# Patient Record
Sex: Female | Born: 1960 | Race: White | Hispanic: No | Marital: Married | Smoking: Never smoker
Health system: Southern US, Community
[De-identification: ages and names within clinical notes are randomized; demographics above are authoritative.]

## PROBLEM LIST (undated history)

## (undated) DIAGNOSIS — G43909 Migraine, unspecified, not intractable, without status migrainosus: Secondary | ICD-10-CM

## (undated) DIAGNOSIS — K760 Fatty (change of) liver, not elsewhere classified: Secondary | ICD-10-CM

## (undated) DIAGNOSIS — E559 Vitamin D deficiency, unspecified: Secondary | ICD-10-CM

## (undated) DIAGNOSIS — N390 Urinary tract infection, site not specified: Secondary | ICD-10-CM

## (undated) DIAGNOSIS — Z8619 Personal history of other infectious and parasitic diseases: Secondary | ICD-10-CM

## (undated) HISTORY — DX: Migraine, unspecified, not intractable, without status migrainosus: G43.909

## (undated) HISTORY — DX: Personal history of other infectious and parasitic diseases: Z86.19

## (undated) HISTORY — DX: Urinary tract infection, site not specified: N39.0

## (undated) HISTORY — PX: DEBRIDEMENT TENNIS ELBOW: SHX1442

---

## 1998-04-28 ENCOUNTER — Other Ambulatory Visit: Admission: RE | Admit: 1998-04-28 | Discharge: 1998-04-28 | Payer: Self-pay | Admitting: Obstetrics and Gynecology

## 1999-05-07 ENCOUNTER — Other Ambulatory Visit: Admission: RE | Admit: 1999-05-07 | Discharge: 1999-05-07 | Payer: Self-pay | Admitting: Obstetrics and Gynecology

## 2000-05-26 ENCOUNTER — Other Ambulatory Visit: Admission: RE | Admit: 2000-05-26 | Discharge: 2000-05-26 | Payer: Self-pay | Admitting: Obstetrics and Gynecology

## 2001-06-16 ENCOUNTER — Other Ambulatory Visit: Admission: RE | Admit: 2001-06-16 | Discharge: 2001-06-16 | Payer: Self-pay | Admitting: Obstetrics and Gynecology

## 2002-08-17 ENCOUNTER — Other Ambulatory Visit: Admission: RE | Admit: 2002-08-17 | Discharge: 2002-08-17 | Payer: Self-pay | Admitting: Obstetrics and Gynecology

## 2003-09-15 ENCOUNTER — Other Ambulatory Visit: Admission: RE | Admit: 2003-09-15 | Discharge: 2003-09-15 | Payer: Self-pay | Admitting: Obstetrics and Gynecology

## 2004-10-31 ENCOUNTER — Other Ambulatory Visit: Admission: RE | Admit: 2004-10-31 | Discharge: 2004-10-31 | Payer: Self-pay | Admitting: Obstetrics and Gynecology

## 2006-11-28 ENCOUNTER — Ambulatory Visit: Payer: Self-pay | Admitting: Orthopaedic Surgery

## 2006-12-11 HISTORY — PX: DEBRIDEMENT TENNIS ELBOW: SHX1442

## 2009-04-15 LAB — HM COLONOSCOPY: HM Colonoscopy: NORMAL

## 2010-08-12 HISTORY — PX: COLONOSCOPY: SHX174

## 2012-03-22 LAB — HM MAMMOGRAPHY: HM Mammogram: NORMAL

## 2012-03-22 LAB — HM COLONOSCOPY: HM Colonoscopy: NORMAL

## 2012-03-22 LAB — HM PAP SMEAR: HM Pap smear: NORMAL

## 2012-09-22 ENCOUNTER — Ambulatory Visit (INDEPENDENT_AMBULATORY_CARE_PROVIDER_SITE_OTHER): Payer: BC Managed Care – PPO | Admitting: Internal Medicine

## 2012-09-22 ENCOUNTER — Encounter: Payer: Self-pay | Admitting: Internal Medicine

## 2012-09-22 VITALS — BP 116/72 | HR 75 | Temp 97.6°F | Resp 16 | Ht 65.0 in | Wt 149.0 lb

## 2012-09-22 DIAGNOSIS — H6120 Impacted cerumen, unspecified ear: Secondary | ICD-10-CM

## 2012-09-22 DIAGNOSIS — R002 Palpitations: Secondary | ICD-10-CM

## 2012-09-22 DIAGNOSIS — R42 Dizziness and giddiness: Secondary | ICD-10-CM

## 2012-09-22 LAB — COMPREHENSIVE METABOLIC PANEL
ALT: 19 U/L (ref 0–35)
AST: 24 U/L (ref 0–37)
Albumin: 4.5 g/dL (ref 3.5–5.2)
Alkaline Phosphatase: 86 U/L (ref 39–117)
BUN: 13 mg/dL (ref 6–23)
CO2: 28 mEq/L (ref 19–32)
Calcium: 9.4 mg/dL (ref 8.4–10.5)
Chloride: 103 mEq/L (ref 96–112)
Creatinine, Ser: 0.8 mg/dL (ref 0.4–1.2)
GFR: 83.43 mL/min (ref >60.00)
Glucose, Bld: 104 mg/dL — ABNORMAL HIGH (ref 70–99)
Potassium: 4.3 mEq/L (ref 3.5–5.1)
Sodium: 139 mEq/L (ref 135–145)
Total Bilirubin: 0.7 mg/dL (ref 0.3–1.2)
Total Protein: 7.2 g/dL (ref 6.0–8.3)

## 2012-09-22 LAB — MAGNESIUM: Magnesium: 1.9 mg/dL (ref 1.5–2.5)

## 2012-09-22 LAB — TSH: TSH: 1.11 u[IU]/mL (ref 0.35–5.50)

## 2012-09-22 LAB — VITAMIN B12: Vitamin B-12: 611 pg/mL (ref 211–911)

## 2012-09-22 MED ORDER — FLUTICASONE PROPIONATE 50 MCG/ACT NA SUSP: 2.0000 | Freq: Every day | NASAL | Status: DC

## 2012-09-22 NOTE — Patient Instructions (Addendum)
Please buy a bottle of Debrox ear drops and put a few drops in your left ear tonight and tomorrow  Return on Thursday afternoon (if convenient) for your ear cleaning  Use the nasal steroid twice daily for the next two weeks   Meclizine for acute attacks of vertigo  Return for fasting lipids/labs prior to your physical in August   Nonfasting labs today to check thyroid

## 2012-09-22 NOTE — Progress Notes (Addendum)
Patient ID: Kristine Mcguire, female   DOB: 05/21/1961, 52 y.o.   MRN: [id number]  Patient Active Problem List  Diagnosis  . Palpitations  . Dizziness and giddiness    Subjective:  CC:   Chief Complaint  Patient presents with  . Establish Care    HPI:   Kristine Mcguire is a healthy 52 y.o. female who presents as a new patient to establish primary care with the chief complaint of  1) Irregular pulse.   Mom died in Jul 23, 2023 and the morning of her funeral she was awakened by an irregular forceful pulse unaccompendied by ligfht headedeness or shortness of breat.  No chest pain.  The feeling lasted 30 minutes.  Has not occurred again.  2) One month HISTORY OF RECURRENT episodes of dizziness which initially occurred while packing up her kitchen prior to  recent renovation.  It has been occurring frequently for  the past week.( "like I'm a  little drunk)  Aggravated by motion.  Tried dramamine which helped. No nausea or light headedness,  Thought it improved with sudafed and advil initially but for the past week the sudafed has not helped.  Continues to feel increased pressure in the ears.,  No symptoms of sinus infection, no palpitations.     Past Medical History  Diagnosis Date  . History of chicken pox   . UTI (urinary tract infection)   . Migraine     Past Surgical History  Procedure Laterality Date  . Cesarean section  1992  . Debridement tennis elbow  208    Right elbow    Family History  Problem Relation Age of Onset  . Hyperlipidemia Mother   . Hypertension Mother 60    Runs in mother's side of family  . Cancer Father     bone  . Cancer Maternal Aunt     breast  . Heart attack Maternal Uncle 46  . Heart attack Maternal Grandmother 76  . Heart attack Maternal Grandfather 68  . Alzheimer's disease Paternal Grandmother     History   Social History  . Marital Status: Single    Spouse Name: N/A    Number of Children: N/A  . Years of Education: N/A    Occupational History  . Not on file.   Social History Main Topics  . Smoking status: Never Smoker   . Smokeless tobacco: Never Used  . Alcohol Use: Yes  . Drug Use: No  . Sexually Active: Not on file   Other Topics Concern  . Not on file   Social History Narrative  . No narrative on file   No Known Allergies   Review of Systems:   Patient denies headache, fevers, malaise, unintentional weight loss, skin rash, eye pain, sinus congestion and sinus pain, sore throat, dysphagia,  hemoptysis , cough, dyspnea, wheezing, chest pain, palpitations, orthopnea, edema, abdominal pain, nausea, melena, diarrhea, constipation, flank pain, dysuria, hematuria, urinary  Frequency, nocturia, numbness, tingling, seizures,  Focal weakness, Loss of consciousness,  Tremor, insomnia, depression, anxiety, and suicidal ideation.    Objective:  BP 116/72  Pulse 75  Temp(Src) 97.6 F (36.4 C) (Oral)  Resp 16  Ht 5\' 5"  (1.651 m)  Wt 149 lb (67.586 kg)  BMI 24.79 kg/m2  SpO2 97%  General appearance: alert, cooperative and appears stated age Ears: left TM not visibel secondary to cerumen impaction. Right TM and canal normal  Oropharynx: mucosa, and tongue normal; teeth and gums normal Neck: no adenopathy, no carotid bruit,  supple, symmetrical, trachea midline and thyroid not enlarged, symmetric, no tenderness/mass/nodules Back: symmetric, no curvature. ROM normal. No CVA tenderness. Lungs: clear to auscultation bilaterally Heart: regular rate and rhythm, S1, S2 normal, no murmur, click, rub or gallop Abdomen: soft, non-tender; bowel sounds normal; no masses,  no organomegaly Pulses: 2+ and symmetric Skin: Skin color, texture, turgor normal. No rashes or lesions Lymph nodes: Cervical, supraclavicular, and axillary nodes normal.  Assessment and Plan:  Palpitations Likely stressed induced, as her ekg shows only sinus bradycardia and all labs are normal  Dizziness and giddiness Etiology appears  to be cerumen impactionoif left ear based on exam,nomral  EKG and review of labs. She will return for disimpaction  Cerumen impaction Debrox, return for irrigation.    Updated Medication List Outpatient Encounter Prescriptions as of 09/22/2012  Medication Sig Dispense Refill  . fluticasone (FLONASE) 50 MCG/ACT nasal spray Place 2 sprays into the nose daily.  16 g  6   No facility-administered encounter medications on file as of 09/22/2012.

## 2012-09-23 DIAGNOSIS — H6120 Impacted cerumen, unspecified ear: Secondary | ICD-10-CM | POA: Insufficient documentation

## 2012-09-23 DIAGNOSIS — R002 Palpitations: Secondary | ICD-10-CM | POA: Insufficient documentation

## 2012-09-23 DIAGNOSIS — R42 Dizziness and giddiness: Secondary | ICD-10-CM | POA: Insufficient documentation

## 2012-09-23 LAB — FOLATE RBC: RBC Folate: 922 ng/mL (ref >=366)

## 2012-09-23 NOTE — Assessment & Plan Note (Signed)
Likely stressed induced, as her ekg shows only sinus bradycardia and all labs are normal

## 2012-09-23 NOTE — Assessment & Plan Note (Signed)
Debrox, return for irrigation.

## 2012-09-23 NOTE — Addendum Note (Signed)
Addended by: Sherlene Shams on: 09/23/2012 05:43 PM   Modules accepted: Level of Service

## 2012-09-23 NOTE — Assessment & Plan Note (Addendum)
Etiology appears to be cerumen impactionoif left ear based on exam,nomral  EKG and review of labs. She will return for disimpaction

## 2012-09-24 ENCOUNTER — Ambulatory Visit: Payer: BC Managed Care – PPO

## 2012-09-29 ENCOUNTER — Ambulatory Visit (INDEPENDENT_AMBULATORY_CARE_PROVIDER_SITE_OTHER): Payer: BC Managed Care – PPO | Admitting: Internal Medicine

## 2012-09-29 DIAGNOSIS — H6120 Impacted cerumen, unspecified ear: Secondary | ICD-10-CM

## 2012-10-01 NOTE — Progress Notes (Signed)
Patient ID: Kristine Mcguire, female   DOB: July 26, 1961, 52 y.o.   MRN: [id number]   Patient Active Problem List  Diagnosis  . Palpitations  . Dizziness and giddiness  . Cerumen impaction    Subjective:  CC:   No chief complaint on file.   HPI:   Kristine Mcguire is a 52 y.o. female who presents for management of cerumen impaction.

## 2012-10-05 ENCOUNTER — Telehealth: Payer: Self-pay | Admitting: Emergency Medicine

## 2012-10-05 DIAGNOSIS — R42 Dizziness and giddiness: Secondary | ICD-10-CM

## 2012-10-05 NOTE — Telephone Encounter (Signed)
Ordered in epic

## 2012-10-05 NOTE — Telephone Encounter (Signed)
Patient was calling and questioning if we had made her an ENT apt. I did not see a referral for this. Please advise. I will be glad to make her an apt if this is ok. She states the dizziness is a lot stronger over the weekend.

## 2012-10-13 ENCOUNTER — Other Ambulatory Visit: Payer: BC Managed Care – PPO

## 2012-11-06 ENCOUNTER — Telehealth: Payer: Self-pay | Admitting: *Deleted

## 2012-11-06 DIAGNOSIS — R42 Dizziness and giddiness: Secondary | ICD-10-CM

## 2012-11-06 DIAGNOSIS — Z13220 Encounter for screening for lipoid disorders: Secondary | ICD-10-CM

## 2012-11-06 NOTE — Telephone Encounter (Signed)
Pt is coming in for labs Monday 03.31.2014 what labs and dx would you like?

## 2012-11-09 ENCOUNTER — Other Ambulatory Visit (INDEPENDENT_AMBULATORY_CARE_PROVIDER_SITE_OTHER): Payer: BC Managed Care – PPO

## 2012-11-09 DIAGNOSIS — R42 Dizziness and giddiness: Secondary | ICD-10-CM

## 2012-11-09 DIAGNOSIS — Z13220 Encounter for screening for lipoid disorders: Secondary | ICD-10-CM

## 2012-11-09 LAB — LIPID PANEL
Cholesterol: 238 mg/dL — ABNORMAL HIGH (ref 0–200)
HDL: 44.6 mg/dL (ref >39.00)
Total CHOL/HDL Ratio: 5
Triglycerides: 145 mg/dL (ref 0.0–149.0)
VLDL: 29 mg/dL (ref 0.0–40.0)

## 2012-11-09 LAB — CBC WITH DIFFERENTIAL/PLATELET
Basophils Absolute: 0 10*3/uL (ref 0.0–0.1)
Basophils Relative: 0.6 % (ref 0.0–3.0)
Eosinophils Absolute: 0.1 10*3/uL (ref 0.0–0.7)
Eosinophils Relative: 2 % (ref 0.0–5.0)
HCT: 40 % (ref 36.0–46.0)
Hemoglobin: 13.6 g/dL (ref 12.0–15.0)
Lymphocytes Relative: 38.4 % (ref 12.0–46.0)
Lymphs Abs: 1.8 10*3/uL (ref 0.7–4.0)
MCHC: 34.1 g/dL (ref 30.0–36.0)
MCV: 89 fl (ref 78.0–100.0)
Monocytes Absolute: 0.4 10*3/uL (ref 0.1–1.0)
Monocytes Relative: 7.9 % (ref 3.0–12.0)
Neutro Abs: 2.4 10*3/uL (ref 1.4–7.7)
Neutrophils Relative %: 51.1 % (ref 43.0–77.0)
Platelets: 289 10*3/uL (ref 150.0–400.0)
RBC: 4.49 Mil/uL (ref 3.87–5.11)
RDW: 13 % (ref 11.5–14.6)
WBC: 4.7 10*3/uL (ref 4.5–10.5)

## 2012-11-09 LAB — LDL CHOLESTEROL, DIRECT: Direct LDL: 170.6 mg/dL

## 2012-11-10 NOTE — Progress Notes (Signed)
Pt notified of the results.  

## 2013-02-17 ENCOUNTER — Encounter: Payer: Self-pay | Admitting: Internal Medicine

## 2013-04-09 ENCOUNTER — Encounter: Payer: BC Managed Care – PPO | Admitting: Internal Medicine

## 2013-04-15 ENCOUNTER — Ambulatory Visit (INDEPENDENT_AMBULATORY_CARE_PROVIDER_SITE_OTHER): Payer: BC Managed Care – PPO | Admitting: Internal Medicine

## 2013-04-15 ENCOUNTER — Encounter: Payer: Self-pay | Admitting: Internal Medicine

## 2013-04-15 VITALS — BP 112/68 | HR 67 | Temp 97.6°F | Resp 14 | Ht 65.75 in | Wt 149.0 lb

## 2013-04-15 DIAGNOSIS — Z23 Encounter for immunization: Secondary | ICD-10-CM

## 2013-04-15 DIAGNOSIS — Z0000 Encounter for general adult medical examination without abnormal findings: Secondary | ICD-10-CM

## 2013-04-15 DIAGNOSIS — Z1211 Encounter for screening for malignant neoplasm of colon: Secondary | ICD-10-CM | POA: Insufficient documentation

## 2013-04-15 DIAGNOSIS — Z1239 Encounter for other screening for malignant neoplasm of breast: Secondary | ICD-10-CM

## 2013-04-15 DIAGNOSIS — E785 Hyperlipidemia, unspecified: Secondary | ICD-10-CM

## 2013-04-15 DIAGNOSIS — R002 Palpitations: Secondary | ICD-10-CM

## 2013-04-15 DIAGNOSIS — N952 Postmenopausal atrophic vaginitis: Secondary | ICD-10-CM

## 2013-04-15 LAB — LIPID PANEL
Cholesterol: 251 mg/dL — ABNORMAL HIGH (ref 0–200)
HDL: 52.9 mg/dL (ref >39.00)
Total CHOL/HDL Ratio: 5
Triglycerides: 136 mg/dL (ref 0.0–149.0)
VLDL: 27.2 mg/dL (ref 0.0–40.0)

## 2013-04-15 LAB — CBC WITH DIFFERENTIAL/PLATELET
Basophils Absolute: 0 10*3/uL (ref 0.0–0.1)
Basophils Relative: 0.7 % (ref 0.0–3.0)
Eosinophils Absolute: 0.1 10*3/uL (ref 0.0–0.7)
Eosinophils Relative: 1.5 % (ref 0.0–5.0)
HCT: 40.3 % (ref 36.0–46.0)
Hemoglobin: 13.6 g/dL (ref 12.0–15.0)
Lymphocytes Relative: 32.5 % (ref 12.0–46.0)
Lymphs Abs: 1.7 10*3/uL (ref 0.7–4.0)
MCHC: 33.7 g/dL (ref 30.0–36.0)
MCV: 90.7 fl (ref 78.0–100.0)
Monocytes Absolute: 0.5 10*3/uL (ref 0.1–1.0)
Monocytes Relative: 8.7 % (ref 3.0–12.0)
Neutro Abs: 3 10*3/uL (ref 1.4–7.7)
Neutrophils Relative %: 56.6 % (ref 43.0–77.0)
Platelets: 304 10*3/uL (ref 150.0–400.0)
RBC: 4.44 Mil/uL (ref 3.87–5.11)
RDW: 12.7 % (ref 11.5–14.6)
WBC: 5.4 10*3/uL (ref 4.5–10.5)

## 2013-04-15 LAB — COMPREHENSIVE METABOLIC PANEL
ALT: 19 U/L (ref 0–35)
AST: 20 U/L (ref 0–37)
Albumin: 4.4 g/dL (ref 3.5–5.2)
Alkaline Phosphatase: 87 U/L (ref 39–117)
BUN: 15 mg/dL (ref 6–23)
CO2: 29 mEq/L (ref 19–32)
Calcium: 9.5 mg/dL (ref 8.4–10.5)
Chloride: 105 mEq/L (ref 96–112)
Creatinine, Ser: 0.8 mg/dL (ref 0.4–1.2)
GFR: 83.25 mL/min (ref >60.00)
Glucose, Bld: 101 mg/dL — ABNORMAL HIGH (ref 70–99)
Potassium: 4.2 mEq/L (ref 3.5–5.1)
Sodium: 138 mEq/L (ref 135–145)
Total Bilirubin: 0.7 mg/dL (ref 0.3–1.2)
Total Protein: 7.1 g/dL (ref 6.0–8.3)

## 2013-04-15 LAB — LDL CHOLESTEROL, DIRECT: Direct LDL: 174.1 mg/dL

## 2013-04-15 LAB — TSH: TSH: 1.5 u[IU]/mL (ref 0.35–5.50)

## 2013-04-15 NOTE — Progress Notes (Signed)
Patient ID: Kristine Mcguire, female   DOB: 10/19/1960, 52 y.o.   MRN: [id number]    Subjective:     Kristine Mcguire is a 52 y.o. female and is here for a comprehensive physical exam. The patient reports .Marland Kitchen  History   Social History  . Marital Status: Single    Spouse Name: N/A    Number of Children: N/A  . Years of Education: N/A   Occupational History  . Not on file.   Social History Main Topics  . Smoking status: Never Smoker   . Smokeless tobacco: Never Used  . Alcohol Use: Yes  . Drug Use: No  . Sexual Activity: Not on file   Other Topics Concern  . Not on file   Social History Narrative  . No narrative on file   Health Maintenance  Topic Date Due  . Influenza Vaccine  03/12/2014  . Mammogram  03/22/2014  . Pap Smear  03/23/2015  . Colonoscopy  03/22/2022  . Tetanus/tdap  04/16/2023    The following portions of the patient's history were reviewed and updated as appropriate: allergies, current medications, past family history, past medical history, past social history, past surgical history and problem list.  Review of Systems A comprehensive review of systems was negative.   Objective:    General appearance: alert, cooperative and appears stated age Head: Normocephalic, without obvious abnormality, atraumatic Eyes: conjunctivae/corneas clear. PERRL, EOM's intact. Fundi benign. Ears: normal TM's and external ear canals both ears Nose: Nares normal. Septum midline. Mucosa normal. No drainage or sinus tenderness. Throat: lips, mucosa, and tongue normal; teeth and gums normal Neck: no adenopathy, no carotid bruit, no JVD, supple, symmetrical, trachea midline and thyroid not enlarged, symmetric, no tenderness/mass/nodules Lungs: clear to auscultation bilaterally Breasts: normal appearance, no masses or tenderness Heart: regular rate and rhythm, S1, S2 normal, no murmur, click, rub or gallop Abdomen: soft, non-tender; bowel sounds normal; no masses,  no  organomegaly Extremities: extremities normal, atraumatic, no cyanosis or edema Pulses: 2+ and symmetric Skin: Skin color, texture, turgor normal. No rashes or lesions Neurologic: Alert and oriented X 3, normal strength and tone. Normal symmetric reflexes. Normal coordination and gait.  Assessment:   Palpitations Prior  Etiology appeared to be anxiety.  EKG normal.,  checking thyroid lytes.  Reduce caffeine in diet.   Routine general medical examination at a health care facility Annual comprehensive exam was done including breast, without pelvic and PAP smear. All screenings have been addressed .    Updated Medication List Outpatient Encounter Prescriptions as of 04/15/2013  Medication Sig Dispense Refill  . PREMARIN vaginal cream Place 1 Applicatorful vaginally as needed.      . fluticasone (FLONASE) 50 MCG/ACT nasal spray Place 2 sprays into the nose daily.  16 g  6   No facility-administered encounter medications on file as of 04/15/2013.

## 2013-04-15 NOTE — Patient Instructions (Addendum)
Mammogram to be set up ASAP at [facility]   PAP smear not due until 2016  Fasting labs today   You recieved the TDaP and the influenza vaccine today

## 2013-04-17 ENCOUNTER — Encounter: Payer: Self-pay | Admitting: Internal Medicine

## 2013-04-17 NOTE — Assessment & Plan Note (Signed)
Prior  Etiology appeared to be anxiety.  EKG normal.,  checking thyroid lytes.  Reduce caffeine in diet.

## 2013-04-17 NOTE — Assessment & Plan Note (Signed)
Annual comprehensive exam was done including breast,without  pelvic and PAP smear. All screenings have been addressed .  

## 2013-04-22 ENCOUNTER — Encounter: Payer: Self-pay | Admitting: *Deleted

## 2013-04-30 ENCOUNTER — Ambulatory Visit: Payer: Self-pay | Admitting: Internal Medicine

## 2013-04-30 LAB — HM MAMMOGRAPHY: HM Mammogram: NORMAL

## 2013-05-11 ENCOUNTER — Other Ambulatory Visit: Payer: Self-pay | Admitting: Internal Medicine

## 2013-07-23 ENCOUNTER — Ambulatory Visit (INDEPENDENT_AMBULATORY_CARE_PROVIDER_SITE_OTHER): Payer: BC Managed Care – PPO | Admitting: Internal Medicine

## 2013-07-23 ENCOUNTER — Encounter: Payer: Self-pay | Admitting: Internal Medicine

## 2013-07-23 ENCOUNTER — Telehealth: Payer: Self-pay | Admitting: Internal Medicine

## 2013-07-23 VITALS — BP 118/78 | HR 82 | Wt 145.0 lb

## 2013-07-23 DIAGNOSIS — H109 Unspecified conjunctivitis: Secondary | ICD-10-CM | POA: Insufficient documentation

## 2013-07-23 DIAGNOSIS — J069 Acute upper respiratory infection, unspecified: Secondary | ICD-10-CM

## 2013-07-23 MED ORDER — POLYMYXIN B-TRIMETHOPRIM 10000-0.1 UNIT/ML-% OP SOLN: 1.0000 [drp] | Freq: Four times a day (QID) | OPHTHALMIC | Status: DC

## 2013-07-23 NOTE — Telephone Encounter (Signed)
Left message, advising pt to keep her appt for today, that an eye drop can not be called without an office visit.

## 2013-07-23 NOTE — Assessment & Plan Note (Signed)
Symptoms are consistent with early left conjunctivitis. We will treat with topical Polytrim. Patient will call if symptoms are not improving.

## 2013-07-23 NOTE — Assessment & Plan Note (Signed)
Symptoms and exam are consistent with viral upper respiratory infection with cough. Encouraged rest, adequate fluid intake, and use of over-the-counter cough medications such as Robitussin-DM as needed. Patient will use Tylenol or ibuprofen as needed for fever or pain. She will call if symptoms are persistent and are not improving over the next few days.

## 2013-07-23 NOTE — Progress Notes (Signed)
Pre visit review using our clinic review tool, if applicable. No additional management support is needed unless otherwise documented below in the visit note. 

## 2013-07-23 NOTE — Telephone Encounter (Signed)
States she has had a cold x9 days that has now moved into her left eye.  States she feels better from the cold but her eye is gooey and painful.  Asking if there is an eyedrop that could be called in for her.  Appt scheduled with Dr. Dan Humphreys 2:45, cancel if pt can get eyedrop.

## 2013-07-23 NOTE — Progress Notes (Signed)
   Subjective:    Patient ID: Kristine Mcguire, female    DOB: July 16, 1961, 52 y.o.   MRN: [id number]  HPI 52 year old female presents for acute visit complaining of almost two-week history of nasal congestion and dry cough. Symptoms first began about 2 weeks ago with general malaise, nasal congestion, and dry cough. Over the last 2 weeks, symptoms have gradually improved. She has been using over-the-counter cough and cold medications with some improvement. She denies any fever or chills. She denies shortness of breath or chest pain. She was concerned today because she has developed left eye redness and exudate. She is concerned about bacterial eye infection. She denies any known sick contacts. She denies any trauma to her eye. She does not have any visual changes or photophobia. She denies eye pain.  Outpatient Prescriptions Prior to Visit  Medication Sig Dispense Refill  . PREMARIN vaginal cream Place 1 Applicatorful vaginally as needed.      . fluticasone (FLONASE) 50 MCG/ACT nasal spray Place 2 sprays into the nose daily.  16 g  6   No facility-administered medications prior to visit.   BP 118/78  Pulse 82  Wt 145 lb (65.772 kg)  SpO2 96%  Review of Systems  Constitutional: Negative for fever, chills and unexpected weight change.  HENT: Positive for congestion, postnasal drip and rhinorrhea. Negative for ear discharge, ear pain, facial swelling, hearing loss, mouth sores, nosebleeds, sinus pressure, sneezing, sore throat, tinnitus, trouble swallowing and voice change.   Eyes: Positive for discharge and redness. Negative for pain and visual disturbance.  Respiratory: Positive for cough. Negative for chest tightness, shortness of breath, wheezing and stridor.   Cardiovascular: Negative for chest pain, palpitations and leg swelling.  Musculoskeletal: Negative for arthralgias, myalgias, neck pain and neck stiffness.  Skin: Negative for color change and rash.  Neurological: Negative for  dizziness, weakness, light-headedness and headaches.  Hematological: Negative for adenopathy.       Objective:   Physical Exam  Constitutional: She is oriented to person, place, and time. She appears well-developed and well-nourished. No distress.  HENT:  Head: Normocephalic and atraumatic.  Right Ear: External ear normal.  Left Ear: External ear normal.  Nose: Nose normal.  Mouth/Throat: Oropharynx is clear and moist. No oropharyngeal exudate.  Eyes: Pupils are equal, round, and reactive to light. Right eye exhibits no discharge. Left eye exhibits discharge. Left conjunctiva is injected. No scleral icterus. Left eye exhibits normal extraocular motion.  Neck: Normal range of motion. Neck supple. No tracheal deviation present. No thyromegaly present.  Cardiovascular: Normal rate, regular rhythm, normal heart sounds and intact distal pulses.  Exam reveals no gallop and no friction rub.   No murmur heard. Pulmonary/Chest: Effort normal and breath sounds normal. No accessory muscle usage. Not tachypneic. No respiratory distress. She has no decreased breath sounds. She has no wheezes. She has no rhonchi. She has no rales. She exhibits no tenderness.  Musculoskeletal: Normal range of motion. She exhibits no edema and no tenderness.  Lymphadenopathy:    She has no cervical adenopathy.  Neurological: She is alert and oriented to person, place, and time. No cranial nerve deficit. She exhibits normal muscle tone. Coordination normal.  Skin: Skin is warm and dry. No rash noted. She is not diaphoretic. No erythema. No pallor.  Psychiatric: She has a normal mood and affect. Her behavior is normal. Judgment and thought content normal.          Assessment & Plan:

## 2013-07-30 ENCOUNTER — Other Ambulatory Visit: Payer: Self-pay | Admitting: *Deleted

## 2013-07-30 DIAGNOSIS — N952 Postmenopausal atrophic vaginitis: Secondary | ICD-10-CM

## 2013-07-30 MED ORDER — ESTROGENS, CONJUGATED 0.625 MG/GM VA CREA: 1.0000 | TOPICAL_CREAM | VAGINAL | Status: DC | PRN

## 2013-10-13 ENCOUNTER — Telehealth: Payer: Self-pay | Admitting: *Deleted

## 2013-10-13 DIAGNOSIS — R5381 Other malaise: Secondary | ICD-10-CM

## 2013-10-13 DIAGNOSIS — R5383 Other fatigue: Secondary | ICD-10-CM

## 2013-10-13 DIAGNOSIS — E559 Vitamin D deficiency, unspecified: Secondary | ICD-10-CM

## 2013-10-13 DIAGNOSIS — E785 Hyperlipidemia, unspecified: Secondary | ICD-10-CM

## 2013-10-13 NOTE — Telephone Encounter (Signed)
Pt is coming in tomorrow what labs and dx?  

## 2013-10-14 ENCOUNTER — Other Ambulatory Visit (INDEPENDENT_AMBULATORY_CARE_PROVIDER_SITE_OTHER): Payer: BC Managed Care – PPO

## 2013-10-14 DIAGNOSIS — E785 Hyperlipidemia, unspecified: Secondary | ICD-10-CM

## 2013-10-14 DIAGNOSIS — R5383 Other fatigue: Principal | ICD-10-CM

## 2013-10-14 DIAGNOSIS — E559 Vitamin D deficiency, unspecified: Secondary | ICD-10-CM

## 2013-10-14 DIAGNOSIS — R5381 Other malaise: Secondary | ICD-10-CM

## 2013-10-14 LAB — COMPREHENSIVE METABOLIC PANEL
ALT: 19 U/L (ref 0–35)
AST: 18 U/L (ref 0–37)
Albumin: 4.3 g/dL (ref 3.5–5.2)
Alkaline Phosphatase: 73 U/L (ref 39–117)
BUN: 19 mg/dL (ref 6–23)
CO2: 28 mEq/L (ref 19–32)
Calcium: 9.5 mg/dL (ref 8.4–10.5)
Chloride: 104 mEq/L (ref 96–112)
Creatinine, Ser: 0.7 mg/dL (ref 0.4–1.2)
GFR: 89.78 mL/min (ref >60.00)
Glucose, Bld: 89 mg/dL (ref 70–99)
Potassium: 4.1 mEq/L (ref 3.5–5.1)
Sodium: 138 mEq/L (ref 135–145)
Total Bilirubin: 1 mg/dL (ref 0.3–1.2)
Total Protein: 7 g/dL (ref 6.0–8.3)

## 2013-10-14 LAB — LIPID PANEL
Cholesterol: 230 mg/dL — ABNORMAL HIGH (ref 0–200)
HDL: 58.8 mg/dL (ref >39.00)
LDL Cholesterol: 155 mg/dL — ABNORMAL HIGH (ref 0–99)
Total CHOL/HDL Ratio: 4
Triglycerides: 80 mg/dL (ref 0.0–149.0)
VLDL: 16 mg/dL (ref 0.0–40.0)

## 2013-10-15 LAB — VITAMIN D 25 HYDROXY (VIT D DEFICIENCY, FRACTURES): Vit D, 25-Hydroxy: 17 ng/mL — ABNORMAL LOW (ref 30–89)

## 2013-10-17 DIAGNOSIS — E559 Vitamin D deficiency, unspecified: Secondary | ICD-10-CM | POA: Insufficient documentation

## 2013-10-17 MED ORDER — ERGOCALCIFEROL 1.25 MG (50000 UT) PO CAPS: 50000.0000 [IU] | ORAL_CAPSULE | ORAL | Status: DC

## 2013-10-17 NOTE — Addendum Note (Signed)
Addended by: [name] on: 10/17/2013 08:49 PM   Modules accepted: Orders

## 2013-10-18 ENCOUNTER — Encounter: Payer: Self-pay | Admitting: *Deleted

## 2013-11-09 ENCOUNTER — Telehealth: Payer: Self-pay | Admitting: Internal Medicine

## 2013-11-09 DIAGNOSIS — E785 Hyperlipidemia, unspecified: Secondary | ICD-10-CM

## 2013-11-09 DIAGNOSIS — Z79899 Other long term (current) drug therapy: Secondary | ICD-10-CM

## 2013-11-09 NOTE — Telephone Encounter (Signed)
Labs entered.

## 2013-11-09 NOTE — Telephone Encounter (Signed)
The patient started on Red yeast rice and she stated that the Dr. Rosanna Randy her to follow up with labs in 3 months . She is scheduled for 6.29.15. Needing labs in the system.

## 2013-11-09 NOTE — Telephone Encounter (Signed)
Please advise as to labs needed.

## 2013-12-29 ENCOUNTER — Encounter: Payer: Self-pay | Admitting: Internal Medicine

## 2013-12-30 ENCOUNTER — Encounter: Payer: Self-pay | Admitting: Internal Medicine

## 2013-12-31 ENCOUNTER — Telehealth: Payer: Self-pay | Admitting: Internal Medicine

## 2013-12-31 NOTE — Telephone Encounter (Signed)
Replied via mychart.

## 2013-12-31 NOTE — Telephone Encounter (Signed)
Pt calling to follow up on two mychart messages sent.  States she would like to speak with someone before the weekend.  Please call.

## 2013-12-31 NOTE — Telephone Encounter (Signed)
Pt states she started Vitamin D and red yeast rice the end of March. At the end of April, had visual aura, slight headache, and dizziness off and on x 3-4 days. Had these episodes during perimenopausal years, but none for 2 years. It then happened again Monday and Wednesday of this week, had intermittent slight headache and dizziness. Stopped the red yeast rice on Tuesday in case it was related. Advised she would possibly need appointment to evaluate since new onset. Pt wanted message sent to Dr. Derrel Nip first for her recommendation.

## 2014-01-03 ENCOUNTER — Telehealth: Payer: Self-pay | Admitting: Internal Medicine

## 2014-01-03 NOTE — Telephone Encounter (Signed)
Please call ms Georgina Peer with an appt.  Thanks  See e mail

## 2014-01-04 NOTE — Telephone Encounter (Signed)
Appt has already been scheduled 01/07/14

## 2014-01-05 ENCOUNTER — Telehealth: Payer: Self-pay | Admitting: Internal Medicine

## 2014-01-05 NOTE — Telephone Encounter (Signed)
FYI

## 2014-01-05 NOTE — Telephone Encounter (Signed)
Patient Information:  Caller Name: [name]  Phone: [phone]  Patient: Kristine Mcguire, Kristine Mcguire  Gender: Female  DOB: 07/15/1961  Age: 53 Years  PCP: Deborra Medina (Adults only)  Pregnant: No  Office Follow Up:  Does the office need to follow up with this patient?: Patient would like to be seen earlier. No appt with PCP available.  Office has scheduled her on Friday 01/07/14 with PCP.   Instructions For The Office: N/A  RN Note:  No appt with available PCP today or tomorrow.  Caller expressed understanding. She will keep appt on Friday 01/07/14 as directed.  Symptoms  Reason For Call & Symptoms: Patient states she thinks she is experiencing Anxiety. Onset Monday 01/03/14, she felt and odd sensation while driving. "maybe a little dizzy, maybe a little funny feeling". She cannot describe sensation. "Anxious sensation /lump in my throat".   It passed in seconds.  Intermittent in nature.  She describes it gets worse when she thinks about it or works herself up. She is able to calm herself and make symptoms go away.  Patient has appt at 09:30 on Friday . She would like to be seen today.  Reviewed Health History In EMR: Yes  Reviewed Medications In EMR: Yes  Reviewed Allergies In EMR: Yes  Reviewed Surgeries / Procedures: Yes  Date of Onset of Symptoms: 01/03/2014 OB / GYN:  LMP: Unknown  Guideline(s) Used:  Weakness (Generalized) and Fatigue  Disposition Per Guideline:   See Today in Office  Reason For Disposition Reached:   Patient wants to be seen  Advice Given:  Call Back If:  Unable to stand or walk  Passes out  Breathing difficulty occurs  You become worse.  Cool Off  : If the weather is hot, apply a cold compress to the forehead or take a cool shower or bath.  Rest  : Lie down with feet elevated for 1 hour. This will improve blood flow and increase blood flow to the brain.  Fluids  : Drink several glasses of fruit juice, other clear fluids, or water. This will improve  hydration and blood glucose.  Patient Will Follow Care Advice:  YES

## 2014-01-06 ENCOUNTER — Ambulatory Visit: Payer: Self-pay | Admitting: Adult Health

## 2014-01-07 ENCOUNTER — Encounter: Payer: Self-pay | Admitting: Internal Medicine

## 2014-01-07 ENCOUNTER — Ambulatory Visit (INDEPENDENT_AMBULATORY_CARE_PROVIDER_SITE_OTHER): Payer: BC Managed Care – PPO | Admitting: Internal Medicine

## 2014-01-07 VITALS — BP 118/70 | HR 75 | Temp 98.5°F | Resp 16 | Ht 65.0 in | Wt 137.8 lb

## 2014-01-07 DIAGNOSIS — R519 Headache, unspecified: Secondary | ICD-10-CM

## 2014-01-07 DIAGNOSIS — R51 Headache: Secondary | ICD-10-CM

## 2014-01-07 DIAGNOSIS — R42 Dizziness and giddiness: Secondary | ICD-10-CM

## 2014-01-07 LAB — CBC WITH DIFFERENTIAL/PLATELET
Basophils Absolute: 0.1 10*3/uL (ref 0.0–0.1)
Basophils Relative: 1.1 % (ref 0.0–3.0)
Eosinophils Absolute: 0 10*3/uL (ref 0.0–0.7)
Eosinophils Relative: 0.3 % (ref 0.0–5.0)
HCT: 41.1 % (ref 36.0–46.0)
Hemoglobin: 13.9 g/dL (ref 12.0–15.0)
Lymphocytes Relative: 27.4 % (ref 12.0–46.0)
Lymphs Abs: 1.6 10*3/uL (ref 0.7–4.0)
MCHC: 33.8 g/dL (ref 30.0–36.0)
MCV: 91 fl (ref 78.0–100.0)
Monocytes Absolute: 0.4 10*3/uL (ref 0.1–1.0)
Monocytes Relative: 7.6 % (ref 3.0–12.0)
Neutro Abs: 3.6 10*3/uL (ref 1.4–7.7)
Neutrophils Relative %: 63.6 % (ref 43.0–77.0)
Platelets: 330 10*3/uL (ref 150.0–400.0)
RBC: 4.52 Mil/uL (ref 3.87–5.11)
RDW: 12.6 % (ref 11.5–15.5)
WBC: 5.7 10*3/uL (ref 4.0–10.5)

## 2014-01-07 LAB — SEDIMENTATION RATE: Sed Rate: 8 mm/hr (ref 0–22)

## 2014-01-07 LAB — C-REACTIVE PROTEIN: CRP: <0.5 mg/dL (ref 0.5–20.0)

## 2014-01-07 MED ORDER — ALPRAZOLAM 0.25 MG PO TABS: 0.2500 mg | ORAL_TABLET | Freq: Two times a day (BID) | ORAL | Status: DC | PRN

## 2014-01-07 NOTE — Assessment & Plan Note (Addendum)
accompanied by tingling feelings on the left side,  abnormal visual sensations,  And headaches.Kristine Mcguire  MRI brain to rule out  MS

## 2014-01-07 NOTE — Progress Notes (Signed)
Patient ID: Kristine Mcguire, female   DOB: 08/30/1960, 53 y.o.   MRN: [id number]   Patient Active Problem List   Diagnosis Date Noted  . Increased frequency of headaches 01/09/2014  . Unspecified vitamin D deficiency 10/17/2013  . Conjunctivitis 07/23/2013  . Viral URI with cough 07/23/2013  . Routine general medical examination at a health care facility 04/15/2013  . Other and unspecified hyperlipidemia 04/15/2013  . Palpitations 09/23/2012  . Dizziness and giddiness 09/23/2012  . Cerumen impaction 09/23/2012    Subjective:  CC:   Chief Complaint  Patient presents with  . Acute Visit    Anxiety issue's are re-occurent migraines.    HPI:   Kristine Mcguire is a 53 y.o. female who presents for Recurrent Migraine  With auras.  First one was April 25th  Lasted 20 minutes without headache,  Next day had  Left sided pain above and below eye.  Off and on,  Pain is not debilitating, sometime sthe headaches are more like sinus headaches  Then aura again that night,     Next episod May 18th i had mild aura  Then recurred on May 20th 15-20 mintues, mild headache .   Sometimes accompanied by dizziness .   The aura is described as flashes  Of zig  zagging lights last about 15 minutes.   The only change in her life, was red yeast rice and vitamin D supplements .    Discussed suspending the red yeast rice for a few months since this is new medication     Anxiety this past week .  Started after having a heaviness in her chest, lump in her throat.  which occurred while driving home did not panic,  Laid down and it was better but started obsessing about it.    Past Medical History  Diagnosis Date  . History of chicken pox   . UTI (urinary tract infection)   . Migraine     Past Surgical History  Procedure Laterality Date  . Cesarean section  1992  . Debridement tennis elbow  208    Right elbow       The following portions of the patient's history were reviewed and updated as  appropriate: Allergies, current medications, and problem list.    Review of Systems:   Patient denies headache, fevers, malaise, unintentional weight loss, skin rash, eye pain, sinus congestion and sinus pain, sore throat, dysphagia,  hemoptysis , cough, dyspnea, wheezing, chest pain, palpitations, orthopnea, edema, abdominal pain, nausea, melena, diarrhea, constipation, flank pain, dysuria, hematuria, urinary  Frequency, nocturia, numbness, tingling, seizures,  Focal weakness, Loss of consciousness,  Tremor, insomnia, depression, anxiety, and suicidal ideation.     History   Social History  . Marital Status: Single    Spouse Name: N/A    Number of Children: N/A  . Years of Education: N/A   Occupational History  . Not on file.   Social History Main Topics  . Smoking status: Never Smoker   . Smokeless tobacco: Never Used  . Alcohol Use: Yes  . Drug Use: No  . Sexual Activity: Not on file   Other Topics Concern  . Not on file   Social History Narrative  . No narrative on file    Objective:  Filed Vitals:   01/07/14 0931  BP: 118/70  Pulse: 75  Temp: 98.5 F (36.9 C)  Resp: 16     General appearance: alert, cooperative and appears stated age Ears: normal TM's and external  ear canals both ears Throat: lips, mucosa, and tongue normal; teeth and gums normal Neck: no adenopathy, no carotid bruit, supple, symmetrical, trachea midline and thyroid not enlarged, symmetric, no tenderness/mass/nodules Back: symmetric, no curvature. ROM normal. No CVA tenderness. Lungs: clear to auscultation bilaterally Heart: regular rate and rhythm, S1, S2 normal, no murmur, click, rub or gallop Abdomen: soft, non-tender; bowel sounds normal; no masses,  no organomegaly Pulses: 2+ and symmetric Skin: Skin color, texture, turgor normal. No rashes or lesions Lymph nodes: Cervical, supraclavicular, and axillary nodes normal.  Assessment and Plan:  Dizziness and giddiness accompanied by  tingling feelings on the left side,  abnormal visual sensations,  And headaches.Marland Kitchen  MRI brain to rule out  MS  Increased frequency of headaches Serologies not suggestive of vascultis.  MRi ordered to rule out mass given increased frequency of headaches   Updated Medication List Outpatient Encounter Prescriptions as of 01/07/2014  Medication Sig  . conjugated estrogens (PREMARIN) vaginal cream Place 1 Applicatorful vaginally as needed.  . ergocalciferol (DRISDOL) 50000 UNITS capsule Take 1 capsule (50,000 Units total) by mouth once a week.  . Red Yeast Rice 600 MG CAPS Take 1 capsule by mouth 2 (two) times daily.  Marland Kitchen ALPRAZolam (XANAX) 0.25 MG tablet Take 1 tablet (0.25 mg total) by mouth 2 (two) times daily as needed for anxiety.  . fluticasone (FLONASE) 50 MCG/ACT nasal spray Place 2 sprays into the nose daily.  Marland Kitchen trimethoprim-polymyxin b (POLYTRIM) ophthalmic solution Place 1 drop into the left eye every 6 (six) hours.     Orders Placed This Encounter  Procedures  . MR Brain W Wo Contrast  . Sedimentation rate  . C-reactive protein  . CBC with Differential    Return in about 4 weeks (around 02/04/2014).

## 2014-01-07 NOTE — Progress Notes (Signed)
Pre-visit discussion using our clinic review tool. No additional management support is needed unless otherwise documented below in the visit note.  

## 2014-01-07 NOTE — Patient Instructions (Signed)
Suspend the red yeast rice for 3 months  MRI of brain to investigate your multiple neurologic symptoms  Alprazolam to take as needed for onset of anxiety    Migraine Headache A migraine headache is an intense, throbbing pain on one or both sides of your head. A migraine can last for 30 minutes to several hours. CAUSES  The exact cause of a migraine headache is not always known. However, a migraine may be caused when nerves in the brain become irritated and release chemicals that cause inflammation. This causes pain. Certain things may also trigger migraines, such as:  Alcohol.  Smoking.  Stress.  Menstruation.  Aged cheeses.  Foods or drinks that contain nitrates, glutamate, aspartame, or tyramine.  Lack of sleep.  Chocolate.  Caffeine.  Hunger.  Physical exertion.  Fatigue.  Medicines used to treat chest pain (nitroglycerine), birth control pills, estrogen, and some blood pressure medicines. SIGNS AND SYMPTOMS  Pain on one or both sides of your head.  Pulsating or throbbing pain.  Severe pain that prevents daily activities.  Pain that is aggravated by any physical activity.  Nausea, vomiting, or both.  Dizziness.  Pain with exposure to bright lights, loud noises, or activity.  General sensitivity to bright lights, loud noises, or smells. Before you get a migraine, you may get warning signs that a migraine is coming (aura). An aura may include:  Seeing flashing lights.  Seeing bright spots, halos, or zig-zag lines.  Having tunnel vision or blurred vision.  Having feelings of numbness or tingling.  Having trouble talking.  Having muscle weakness. DIAGNOSIS  A migraine headache is often diagnosed based on:  Symptoms.  Physical exam.  A CT scan or MRI of your head. These imaging tests cannot diagnose migraines, but they can help rule out other causes of headaches. TREATMENT Medicines may be given for pain and nausea. Medicines can also be  given to help prevent recurrent migraines.  HOME CARE INSTRUCTIONS  Only take over-the-counter or prescription medicines for pain or discomfort as directed by your health care provider. The use of long-term narcotics is not recommended.  Lie down in a dark, quiet room when you have a migraine.  Keep a journal to find out what may trigger your migraine headaches. For example, write down:  What you eat and drink.  How much sleep you get.  Any change to your diet or medicines.  Limit alcohol consumption.  Quit smoking if you smoke.  Get 7 9 hours of sleep, or as recommended by your health care provider.  Limit stress.  Keep lights dim if bright lights bother you and make your migraines worse. SEEK IMMEDIATE MEDICAL CARE IF:   Your migraine becomes severe.  You have a fever.  You have a stiff neck.  You have vision loss.  You have muscular weakness or loss of muscle control.  You start losing your balance or have trouble walking.  You feel faint or pass out.  You have severe symptoms that are different from your first symptoms. MAKE SURE YOU:   Understand these instructions.  Will watch your condition.  Will get help right away if you are not doing well or get worse. Document Released: 07/29/2005 Document Revised: 05/19/2013 Document Reviewed: 04/05/2013 [facility] Patient Information 2014 [place].

## 2014-01-09 ENCOUNTER — Encounter: Payer: Self-pay | Admitting: Internal Medicine

## 2014-01-09 DIAGNOSIS — G43909 Migraine, unspecified, not intractable, without status migrainosus: Secondary | ICD-10-CM | POA: Insufficient documentation

## 2014-01-09 NOTE — Assessment & Plan Note (Signed)
Serologies not suggestive of vascultis.  MRi ordered to rule out mass given increased frequency of headaches

## 2014-01-11 ENCOUNTER — Encounter: Payer: Self-pay | Admitting: Internal Medicine

## 2014-01-11 ENCOUNTER — Ambulatory Visit
Admission: RE | Admit: 2014-01-11 | Discharge: 2014-01-11 | Disposition: A | Discharge status: Home / Self Care | Payer: No Typology Code available for payment source | Source: Ambulatory Visit | Attending: Internal Medicine | Admitting: Internal Medicine

## 2014-01-11 DIAGNOSIS — R519 Headache, unspecified: Secondary | ICD-10-CM

## 2014-01-11 DIAGNOSIS — R42 Dizziness and giddiness: Secondary | ICD-10-CM

## 2014-01-11 DIAGNOSIS — R51 Headache: Secondary | ICD-10-CM

## 2014-01-11 MED ORDER — GADOBENATE DIMEGLUMINE 529 MG/ML IV SOLN
12.0000 mL | Freq: Once | INTRAVENOUS | Status: AC | PRN
  Administered 2014-01-11: 12 mL via INTRAVENOUS

## 2014-01-14 ENCOUNTER — Encounter: Payer: Self-pay | Admitting: Internal Medicine

## 2014-01-14 NOTE — Telephone Encounter (Signed)
Juliann Pulse can you send her the contact info for the 3 women therapists we recommend for counselling?  thanks

## 2014-01-27 ENCOUNTER — Telehealth: Payer: Self-pay | Admitting: Internal Medicine

## 2014-01-27 ENCOUNTER — Encounter: Payer: Self-pay | Admitting: Internal Medicine

## 2014-01-27 NOTE — Telephone Encounter (Signed)
Please schedule

## 2014-01-27 NOTE — Telephone Encounter (Signed)
See telephone note, pt has been scheduled with Dr. Derrel Nip tomorrow

## 2014-01-27 NOTE — Telephone Encounter (Signed)
Left message for the patient to confirm appointment . She is my chart active it should appear on her my chart appointments.

## 2014-01-27 NOTE — Telephone Encounter (Signed)
The patient is needing a sooner appointment than 7.1.15 . She is needing her anxiety medication tweaked.

## 2014-01-27 NOTE — Telephone Encounter (Signed)
This is first available here,  Please advise patient could go to [facility] sooner but I didn't know when it comes to controlled Meds if you would rather handle. Please advise.

## 2014-01-27 NOTE — Telephone Encounter (Signed)
Please add her on for 11:30 tomorrow Friday

## 2014-01-28 ENCOUNTER — Ambulatory Visit (INDEPENDENT_AMBULATORY_CARE_PROVIDER_SITE_OTHER): Payer: BC Managed Care – PPO | Admitting: Internal Medicine

## 2014-01-28 ENCOUNTER — Encounter: Payer: Self-pay | Admitting: Internal Medicine

## 2014-01-28 VITALS — BP 106/66 | HR 73 | Temp 97.9°F | Resp 16 | Ht 65.0 in | Wt 134.0 lb

## 2014-01-28 DIAGNOSIS — F411 Generalized anxiety disorder: Secondary | ICD-10-CM

## 2014-01-28 MED ORDER — SERTRALINE HCL 50 MG PO TABS: 50.0000 mg | ORAL_TABLET | Freq: Every day | ORAL | Status: DC

## 2014-01-28 MED ORDER — ALPRAZOLAM 0.5 MG PO TABS: 0.5000 mg | ORAL_TABLET | Freq: Two times a day (BID) | ORAL | Status: DC | PRN

## 2014-01-28 NOTE — Patient Instructions (Addendum)
Trial of zoloft .  Start with 1/2 tablet after dinner for 4 days,. Then increse to full tablet  You may change to a morning dose if it keeps you up.   We can increase the dose to 100 mg if needed to ,  But should wait unitil after a minimum of 3 weeks   I have increased your alprazolam to 0.5 mg and you may use 1/2 or full tablet as needed

## 2014-01-28 NOTE — Progress Notes (Signed)
Pre-visit discussion using our clinic review tool. No additional management support is needed unless otherwise documented below in the visit note.  

## 2014-01-28 NOTE — Progress Notes (Signed)
Patient ID: Kristine Mcguire, female   DOB: 02/23/1961, 53 y.o.   MRN: [id number]   Patient Active Problem List   Diagnosis Date Noted  . Generalized anxiety disorder 01/30/2014  . Increased frequency of headaches 01/09/2014  . Unspecified vitamin D deficiency 10/17/2013  . Conjunctivitis 07/23/2013  . Viral URI with cough 07/23/2013  . Routine general medical examination at a health care facility 04/15/2013  . Other and unspecified hyperlipidemia 04/15/2013  . Palpitations 09/23/2012  . Dizziness and giddiness 09/23/2012  . Cerumen impaction 09/23/2012    Subjective:  CC:   Chief Complaint  Patient presents with  . Anxiety    Issues    HPI:   Kristine Mcguire is a 53 y.o. female who presents for Follow  Up on anxiety with recent develop of panic attacks  [place] episode [place].  Now feels anxiety whenever thinking about driving long distances or getting onto airplanes.  Husband is a [employer] manager and this is unusual for her.  Was given alprazolam 0.25 mg last month to take prn , which helps but "it took 30 minutes" to have an effect.  Has been walking but gave up runnign bc she was afraid it would happen while running  Triggers:  Sons have graduated and moved out.  Has been an empty nester for the past year.  Girlfriend disgnosed with breast cancer.    Past Medical History  Diagnosis Date  . History of chicken pox   . UTI (urinary tract infection)   . Migraine     Past Surgical History  Procedure Laterality Date  . Cesarean section  1992  . Debridement tennis elbow  208    Right elbow       The following portions of the patient's history were reviewed and updated as appropriate: Allergies, current medications, and problem list.    Review of Systems:   Patient denies headache, fevers, malaise, unintentional weight loss, skin rash, eye pain, sinus congestion and sinus pain, sore throat, dysphagia,  hemoptysis ,  cough, dyspnea, wheezing, chest pain, palpitations, orthopnea, edema, abdominal pain, nausea, melena, diarrhea, constipation, flank pain, dysuria, hematuria, urinary  Frequency, nocturia, numbness, tingling, seizures,  Focal weakness, Loss of consciousness,  Tremor, insomnia, depression, anxiety, and suicidal ideation.     History   Social History  . Marital Status: Single    Spouse Name: N/A    Number of Children: N/A  . Years of Education: N/A   Occupational History  . Not on file.   Social History Main Topics  . Smoking status: Never Smoker   . Smokeless tobacco: Never Used  . Alcohol Use: Yes  . Drug Use: No  . Sexual Activity: Not on file   Other Topics Concern  . Not on file   Social History Narrative  . No narrative on file    Objective:  Filed Vitals:   01/28/14 1132  BP: 106/66  Pulse: 73  Temp: 97.9 F (36.6 C)  Resp: 16     General appearance: alert, cooperative and appears stated age Ears: normal TM's and external ear canals both ears Throat: lips, mucosa, and tongue normal; teeth and gums normal Neck: no adenopathy, no carotid bruit, supple, symmetrical, trachea midline and thyroid not enlarged, symmetric, no tenderness/mass/nodules Back: symmetric, no curvature. ROM normal. No CVA tenderness. Lungs: clear to auscultation bilaterally Heart: regular rate and rhythm, S1, S2 normal, no murmur, click, rub or gallop Abdomen: soft, non-tender; bowel sounds normal;  no masses,  no organomegaly Pulses: 2+ and symmetric Skin: Skin color, texture, turgor normal. No rashes or lesions Lymph nodes: Cervical, supraclavicular, and axillary nodes normal.  Assessment and Plan:  Generalized anxiety disorder Recommended starting an SSRI for recurrent symptoms,  zoloft cosen for persistent intrusinve obsessive thoughts about her heath  A total of  25 minutes of face to face time was spent with patient more than half of which was spent in counselling and coordination  of care   Updated Medication List Outpatient Encounter Prescriptions as of 01/28/2014  Medication Sig  . ALPRAZolam (XANAX) 0.5 MG tablet Take 1 tablet (0.5 mg total) by mouth 2 (two) times daily as needed for anxiety.  . conjugated estrogens (PREMARIN) vaginal cream Place 1 Applicatorful vaginally as needed.  . [DISCONTINUED] ALPRAZolam (XANAX) 0.25 MG tablet Take 1 tablet (0.25 mg total) by mouth 2 (two) times daily as needed for anxiety.  . ergocalciferol (DRISDOL) 50000 UNITS capsule Take 1 capsule (50,000 Units total) by mouth once a week.  . fluticasone (FLONASE) 50 MCG/ACT nasal spray Place 2 sprays into the nose daily.  . Red Yeast Rice 600 MG CAPS Take 1 capsule by mouth 2 (two) times daily.  . sertraline (ZOLOFT) 50 MG tablet Take 1 tablet (50 mg total) by mouth daily.  . [DISCONTINUED] trimethoprim-polymyxin b (POLYTRIM) ophthalmic solution Place 1 drop into the left eye every 6 (six) hours.     No orders of the defined types were placed in this encounter.    No Follow-up on file.

## 2014-01-30 DIAGNOSIS — F411 Generalized anxiety disorder: Secondary | ICD-10-CM | POA: Insufficient documentation

## 2014-01-30 NOTE — Assessment & Plan Note (Signed)
Recommended starting an SSRI for recurrent symptoms,  zoloft cosen for persistent intrusinve obsessive thoughts about her heath

## 2014-01-31 ENCOUNTER — Encounter: Payer: Self-pay | Admitting: Internal Medicine

## 2014-02-01 ENCOUNTER — Encounter: Payer: Self-pay | Admitting: Internal Medicine

## 2014-02-02 ENCOUNTER — Other Ambulatory Visit: Payer: Self-pay | Admitting: Internal Medicine

## 2014-02-02 DIAGNOSIS — F411 Generalized anxiety disorder: Secondary | ICD-10-CM

## 2014-02-02 DIAGNOSIS — G629 Polyneuropathy, unspecified: Secondary | ICD-10-CM

## 2014-02-03 ENCOUNTER — Other Ambulatory Visit (INDEPENDENT_AMBULATORY_CARE_PROVIDER_SITE_OTHER): Payer: BC Managed Care – PPO

## 2014-02-03 ENCOUNTER — Encounter: Payer: Self-pay | Admitting: Internal Medicine

## 2014-02-03 DIAGNOSIS — G629 Polyneuropathy, unspecified: Secondary | ICD-10-CM

## 2014-02-03 DIAGNOSIS — Z79899 Other long term (current) drug therapy: Secondary | ICD-10-CM

## 2014-02-03 DIAGNOSIS — G589 Mononeuropathy, unspecified: Secondary | ICD-10-CM

## 2014-02-03 DIAGNOSIS — E785 Hyperlipidemia, unspecified: Secondary | ICD-10-CM

## 2014-02-03 LAB — LIPID PANEL
Cholesterol: 206 mg/dL — ABNORMAL HIGH (ref 0–200)
HDL: 54.7 mg/dL (ref >39.00)
LDL Cholesterol: 136 mg/dL — ABNORMAL HIGH (ref 0–99)
NonHDL: 151.3
Total CHOL/HDL Ratio: 4
Triglycerides: 79 mg/dL (ref 0.0–149.0)
VLDL: 15.8 mg/dL (ref 0.0–40.0)

## 2014-02-03 LAB — COMPREHENSIVE METABOLIC PANEL
ALT: 17 U/L (ref 0–35)
AST: 19 U/L (ref 0–37)
Albumin: 4.7 g/dL (ref 3.5–5.2)
Alkaline Phosphatase: 68 U/L (ref 39–117)
BUN: 12 mg/dL (ref 6–23)
CO2: 28 mEq/L (ref 19–32)
Calcium: 9.8 mg/dL (ref 8.4–10.5)
Chloride: 105 mEq/L (ref 96–112)
Creatinine, Ser: 0.7 mg/dL (ref 0.4–1.2)
GFR: 94.2 mL/min (ref >60.00)
Glucose, Bld: 98 mg/dL (ref 70–99)
Potassium: 3.9 mEq/L (ref 3.5–5.1)
Sodium: 140 mEq/L (ref 135–145)
Total Bilirubin: 0.6 mg/dL (ref 0.2–1.2)
Total Protein: 6.9 g/dL (ref 6.0–8.3)

## 2014-02-03 LAB — VITAMIN B12: Vitamin B-12: 517 pg/mL (ref 211–911)

## 2014-02-04 ENCOUNTER — Ambulatory Visit: Payer: BC Managed Care – PPO | Admitting: Internal Medicine

## 2014-02-04 LAB — T4 AND TSH
T4, Total: 11 ug/dL (ref 4.5–12.0)
TSH: 0.943 u[IU]/mL (ref 0.450–4.500)

## 2014-02-04 LAB — FOLATE RBC: RBC Folate: 263 ng/mL — ABNORMAL LOW (ref >280)

## 2014-02-05 ENCOUNTER — Encounter: Payer: Self-pay | Admitting: Internal Medicine

## 2014-02-07 ENCOUNTER — Other Ambulatory Visit: Payer: BC Managed Care – PPO

## 2014-02-09 ENCOUNTER — Encounter: Payer: Self-pay | Admitting: Internal Medicine

## 2014-02-09 ENCOUNTER — Ambulatory Visit (INDEPENDENT_AMBULATORY_CARE_PROVIDER_SITE_OTHER): Payer: BC Managed Care – PPO | Admitting: Internal Medicine

## 2014-02-09 VITALS — BP 118/72 | HR 70 | Temp 98.2°F | Resp 16 | Wt 128.8 lb

## 2014-02-09 DIAGNOSIS — F411 Generalized anxiety disorder: Secondary | ICD-10-CM

## 2014-02-09 DIAGNOSIS — R519 Headache, unspecified: Secondary | ICD-10-CM

## 2014-02-09 DIAGNOSIS — R51 Headache: Secondary | ICD-10-CM

## 2014-02-09 MED ORDER — ONDANSETRON HCL 4 MG PO TABS: 4.0000 mg | ORAL_TABLET | Freq: Three times a day (TID) | ORAL | Status: DC | PRN

## 2014-02-09 MED ORDER — CITALOPRAM HYDROBROMIDE 10 MG PO TABS: 10.0000 mg | ORAL_TABLET | Freq: Every day | ORAL | Status: DC

## 2014-02-09 NOTE — Progress Notes (Signed)
Patient ID: Kristine Mcguire, female   DOB: Aug 17, 1960, 53 y.o.   MRN: [id number]   Patient Active Problem List   Diagnosis Date Noted  . Generalized anxiety disorder 01/30/2014  . Increased frequency of headaches 01/09/2014  . Unspecified vitamin D deficiency 10/17/2013  . Routine general medical examination at a health care facility 04/15/2013  . Other and unspecified hyperlipidemia 04/15/2013    Subjective:  CC:   No chief complaint on file.   HPI:   Kristine Mcguire is a 53 y.o. female who presents for Follow up on new onset GAD.  Did not  tolerate a very brief trial of zoloft (took 25 mg daily for 3 days) due to recurrent nausea and development of a burning sensation in her legs which spread to her entire body, both of which resolved after she stopped the medication on June 22.  She continues to use 0.25 of alprazolam as needed,  Which has been intermittent and not daily.  She has good days where she feels strong and unafraid, followed by days where she is overwhelmed with a feeling of unease, and becomes afraid of having another  panic attack so she stays home, avoids driving and shopping and any situation that might catch her in a vulnerable position if she were to have a panic attack.  Has not attended yoga or done any kind of regular exercise, which she used to enjoy.  She has suffered from claustrophobia in the distant past,  but not recently , and does not endorse any feelings of agoraphobia.  She  does feel better if she gets outside. She has had insomnia several nights per week but not every night.      She has met with therapist for initial visit, Karen [place] . Felt it helped.  Wants to see her weekly but therapist's schedule is problematic.   Has talked to friends,  Two of which have benefitted from celexa    Past Medical History  Diagnosis Date  . History of chicken pox   . UTI (urinary tract infection)   . Migraine     Past Surgical History  Procedure Laterality  Date  . Cesarean section  1992  . Debridement tennis elbow  208    Right elbow       The following portions of the patient's history were reviewed and updated as appropriate: Allergies, current medications, and problem list.    Review of Systems:   Patient denies headache, fevers, malaise, unintentional weight loss, skin rash, eye pain, sinus congestion and sinus pain, sore throat, dysphagia,  hemoptysis , cough, dyspnea, wheezing, chest pain, palpitations, orthopnea, edema, abdominal pain, nausea, melena, diarrhea, constipation, flank pain, dysuria, hematuria, urinary  Frequency, nocturia, numbness, tingling, seizures,  Focal weakness, Loss of consciousness,  Tremor, insomnia, depression, anxiety, and suicidal ideation.     History   Social History  . Marital Status: Single    Spouse Name: N/A    Number of Children: N/A  . Years of Education: N/A   Occupational History  . Not on file.   Social History Main Topics  . Smoking status: Never Smoker   . Smokeless tobacco: Never Used  . Alcohol Use: Yes  . Drug Use: No  . Sexual Activity: Not on file   Other Topics Concern  . Not on file   Social History Narrative  . No narrative on file    Objective:  Filed Vitals:   02/09/14 0813  BP: 118/72  Pulse: 70  Temp: 98.2 F (36.8 C)  Resp: 16     General appearance: alert, cooperative and appears stated age Ears: normal TM's and external ear canals both ears Throat: lips, mucosa, and tongue normal; teeth and gums normal Neck: no adenopathy, no carotid bruit, supple, symmetrical, trachea midline and thyroid not enlarged, symmetric, no tenderness/mass/nodules Back: symmetric, no curvature. ROM normal. No CVA tenderness. Lungs: clear to auscultation bilaterally Heart: regular rate and rhythm, S1, S2 normal, no murmur, click, rub or gallop Abdomen: soft, non-tender; bowel sounds normal; no masses,  no organomegaly Pulses: 2+ and symmetric Skin: Skin color, texture,  turgor normal. No rashes or lesions Lymph nodes: Cervical, supraclavicular, and axillary nodes normal.  Assessment and Plan:  Increased frequency of headaches Investigated with MRI given patient's concurrent episodes of dizziness and palpitations and new onset anxiety. Headaches have improved and MRI was normal.  Generalized anxiety disorder New onset,.  No obvious triggers.  Has had no recent losses or trauma, but acknowledges that the recent transition to "empty nest" status in 2013 may be contributing.  Did not tolerate zoloft , which was chosen to mitigate excessive concerns about her health. She lives in fear of having another panic attack, which is resulting in her abstaining from healthy/pleasurable activities.  We discussed a trial of citalopram since she has anecdotal proof of efificacy and tolerability from several  Friends.  Continue prn alprazolam,  Encouraged her to find a yoga class for relaxation, and continue therapeutic relationship with Katrine Coho.   A total of  25 minutes was spent with patient more than half of which was spent in counseling, reviewing recent workup, and coordination of care.   Updated Medication List Outpatient Encounter Prescriptions as of 02/09/2014  Medication Sig  . ALPRAZolam (XANAX) 0.5 MG tablet Take 1 tablet (0.5 mg total) by mouth 2 (two) times daily as needed for anxiety.  . citalopram (CELEXA) 10 MG tablet Take 1 tablet (10 mg total) by mouth daily.  Marland Kitchen conjugated estrogens (PREMARIN) vaginal cream Place 1 Applicatorful vaginally as needed.  . ergocalciferol (DRISDOL) 50000 UNITS capsule Take 1 capsule (50,000 Units total) by mouth once a week.  . fluticasone (FLONASE) 50 MCG/ACT nasal spray Place 2 sprays into the nose daily.  . ondansetron (ZOFRAN) 4 MG tablet Take 1 tablet (4 mg total) by mouth every 8 (eight) hours as needed for nausea or vomiting.  . Red Yeast Rice 600 MG CAPS Take 1 capsule by mouth 2 (two) times daily.  . [DISCONTINUED]  sertraline (ZOLOFT) 50 MG tablet Take 1 tablet (50 mg total) by mouth daily.     No orders of the defined types were placed in this encounter.    No Follow-up on file.

## 2014-02-09 NOTE — Patient Instructions (Signed)
Zofran as needed for nausea  Trial of citalopram .  Start with 1/2 tablet right after dinner for a few days  use 0.25 mg alprazolam if insomnia lasts more  than 45 minutes   FIND A RELAXING  YOGA CLASS TO TAKE!!!

## 2014-02-09 NOTE — Progress Notes (Signed)
Pre visit review using our clinic review tool, if applicable. No additional management support is needed unless otherwise documented below in the visit note. 

## 2014-02-11 NOTE — Assessment & Plan Note (Signed)
Investigated with MRI given patient's concurrent episodes of dizziness and palpitations and new onset anxiety. Headaches have improved and MRI was normal.

## 2014-02-11 NOTE — Assessment & Plan Note (Signed)
New onset,.  No obvious triggers.  Has had no recent losses or trauma, but acknowledges that the recent transition to "empty nest" status in 2013 may be contributing.  Did not tolerate zoloft , which was chosen to mitigate excessive concerns about her health. She lives in fear of having another panic attack, which is resulting in her abstaining from healthy/pleasurable activities.  We discussed a trial of citalopram since she has anecdotal proof of efificacy and tolerability from several  Friends.  Continue prn alprazolam,  Encouraged her to find a yoga class for relaxation, and continue therapeutic relationship with Katrine Coho.

## 2014-02-14 ENCOUNTER — Encounter: Payer: Self-pay | Admitting: Internal Medicine

## 2014-02-16 ENCOUNTER — Other Ambulatory Visit: Payer: Self-pay | Admitting: Internal Medicine

## 2014-02-16 ENCOUNTER — Encounter: Payer: Self-pay | Admitting: Internal Medicine

## 2014-02-16 ENCOUNTER — Telehealth: Payer: Self-pay | Admitting: Internal Medicine

## 2014-02-16 DIAGNOSIS — F411 Generalized anxiety disorder: Secondary | ICD-10-CM

## 2014-02-16 NOTE — Telephone Encounter (Signed)
Patient called and stated that she had been taking Celexa for about 1 and half weeks and woke this morning in a fog, and the only word that kept going through her mind was suicide. Immediately advised patient she should call someone to stay with her , patient called neighbor on mobile. Patient stated she was like she was on a fog and that was the only word she could think of. After conferring with Dr. Derrel Nip was advised to call patient and have her go to the ER. Called [atient and advised her of MD Order and patient refused stating it was just the word she had in her head and not the thought, Dr. Derrel Nip intervened and stated she would recommend psychiatric evaluation.  Patient voiced understanding. FYI

## 2014-02-24 ENCOUNTER — Encounter: Payer: Self-pay | Admitting: Internal Medicine

## 2014-03-08 DIAGNOSIS — D229 Melanocytic nevi, unspecified: Secondary | ICD-10-CM

## 2014-03-08 DIAGNOSIS — Z86018 Personal history of other benign neoplasm: Secondary | ICD-10-CM

## 2014-03-08 HISTORY — DX: Personal history of other benign neoplasm: Z86.018

## 2014-03-08 HISTORY — DX: Melanocytic nevi, unspecified: D22.9

## 2014-05-13 ENCOUNTER — Ambulatory Visit: Payer: Self-pay | Admitting: Internal Medicine

## 2014-05-13 ENCOUNTER — Encounter: Payer: Self-pay | Admitting: *Deleted

## 2014-05-13 LAB — HM MAMMOGRAPHY: HM Mammogram: NEGATIVE

## 2014-05-30 ENCOUNTER — Encounter: Payer: BC Managed Care – PPO | Admitting: Internal Medicine

## 2014-06-27 ENCOUNTER — Telehealth: Payer: Self-pay | Admitting: *Deleted

## 2014-06-27 ENCOUNTER — Other Ambulatory Visit (INDEPENDENT_AMBULATORY_CARE_PROVIDER_SITE_OTHER): Payer: BC Managed Care – PPO

## 2014-06-27 DIAGNOSIS — E538 Deficiency of other specified B group vitamins: Secondary | ICD-10-CM

## 2014-06-27 DIAGNOSIS — E785 Hyperlipidemia, unspecified: Secondary | ICD-10-CM

## 2014-06-27 DIAGNOSIS — R5383 Other fatigue: Secondary | ICD-10-CM

## 2014-06-27 DIAGNOSIS — E559 Vitamin D deficiency, unspecified: Secondary | ICD-10-CM

## 2014-06-27 LAB — CBC WITH DIFFERENTIAL/PLATELET
Basophils Absolute: 0 10*3/uL (ref 0.0–0.1)
Basophils Relative: 0.8 % (ref 0.0–3.0)
Eosinophils Absolute: 0.1 10*3/uL (ref 0.0–0.7)
Eosinophils Relative: 1.5 % (ref 0.0–5.0)
HCT: 41.6 % (ref 36.0–46.0)
Hemoglobin: 13.7 g/dL (ref 12.0–15.0)
Lymphocytes Relative: 39.4 % (ref 12.0–46.0)
Lymphs Abs: 2 10*3/uL (ref 0.7–4.0)
MCHC: 33 g/dL (ref 30.0–36.0)
MCV: 92.3 fl (ref 78.0–100.0)
Monocytes Absolute: 0.4 10*3/uL (ref 0.1–1.0)
Monocytes Relative: 7.7 % (ref 3.0–12.0)
Neutro Abs: 2.6 10*3/uL (ref 1.4–7.7)
Neutrophils Relative %: 50.6 % (ref 43.0–77.0)
Platelets: 304 10*3/uL (ref 150.0–400.0)
RBC: 4.51 Mil/uL (ref 3.87–5.11)
RDW: 12.6 % (ref 11.5–15.5)
WBC: 5.1 10*3/uL (ref 4.0–10.5)

## 2014-06-27 NOTE — Telephone Encounter (Signed)
What labs and dx?  

## 2014-06-28 LAB — COMPREHENSIVE METABOLIC PANEL
ALT: 22 U/L (ref 0–35)
AST: 23 U/L (ref 0–37)
Albumin: 4.4 g/dL (ref 3.5–5.2)
Alkaline Phosphatase: 81 U/L (ref 39–117)
BUN: 16 mg/dL (ref 6–23)
CO2: 26 mEq/L (ref 19–32)
Calcium: 9.5 mg/dL (ref 8.4–10.5)
Chloride: 106 mEq/L (ref 96–112)
Creatinine, Ser: 0.8 mg/dL (ref 0.4–1.2)
GFR: 81.65 mL/min (ref >60.00)
Glucose, Bld: 99 mg/dL (ref 70–99)
Potassium: 4.6 mEq/L (ref 3.5–5.1)
Sodium: 140 mEq/L (ref 135–145)
Total Bilirubin: 0.6 mg/dL (ref 0.2–1.2)
Total Protein: 6.4 g/dL (ref 6.0–8.3)

## 2014-06-28 LAB — LIPID PANEL
Cholesterol: 264 mg/dL — ABNORMAL HIGH (ref 0–200)
HDL: 68 mg/dL (ref >39.00)
LDL Cholesterol: 179 mg/dL — ABNORMAL HIGH (ref 0–99)
NonHDL: 196
Total CHOL/HDL Ratio: 4
Triglycerides: 87 mg/dL (ref 0.0–149.0)
VLDL: 17.4 mg/dL (ref 0.0–40.0)

## 2014-06-28 LAB — TSH: TSH: 1.07 u[IU]/mL (ref 0.35–4.50)

## 2014-06-28 LAB — FOLATE RBC: RBC Folate: 685 ng/mL (ref >280)

## 2014-06-28 LAB — VITAMIN D 25 HYDROXY (VIT D DEFICIENCY, FRACTURES): VITD: 27.08 ng/mL — ABNORMAL LOW (ref 30.00–100.00)

## 2014-06-29 ENCOUNTER — Encounter: Payer: Self-pay | Admitting: Internal Medicine

## 2014-06-29 ENCOUNTER — Ambulatory Visit (INDEPENDENT_AMBULATORY_CARE_PROVIDER_SITE_OTHER): Payer: BC Managed Care – PPO | Admitting: *Deleted

## 2014-06-29 ENCOUNTER — Ambulatory Visit (INDEPENDENT_AMBULATORY_CARE_PROVIDER_SITE_OTHER): Payer: BC Managed Care – PPO | Admitting: Internal Medicine

## 2014-06-29 VITALS — BP 116/68 | HR 83 | Temp 98.2°F | Resp 16 | Ht 64.75 in | Wt 130.8 lb

## 2014-06-29 DIAGNOSIS — E785 Hyperlipidemia, unspecified: Secondary | ICD-10-CM

## 2014-06-29 DIAGNOSIS — Z0000 Encounter for general adult medical examination without abnormal findings: Secondary | ICD-10-CM

## 2014-06-29 DIAGNOSIS — Z23 Encounter for immunization: Secondary | ICD-10-CM

## 2014-06-29 DIAGNOSIS — F411 Generalized anxiety disorder: Secondary | ICD-10-CM

## 2014-06-29 NOTE — Progress Notes (Signed)
Patient ID: Kristine Mcguire, female   DOB: February 08, 1961, 53 y.o.   MRN: [id number]   Subjective:     Kristine Mcguire is a 53 y.o. female and is here for a comprehensive physical exam. The patient reports no problems.  Annual exam.  Has been seeing Dr Nicolasa Ducking for GAD and Jeannett Senior for management of OCD.  Since her last visit she lost over 20 lbs.  Her appetite has improved and she has gained weight and feeling much better.  She feels it is time to start weaning of the citalopram , currently taking 30 mg   Daily .  Doing yoga for exercise 2-3 times per week. Walks/runs 2 days per week an dwt trains once per week  Mammogram was normal in October, and se is up to date on colon and cervical CA screening.    History   Social History  . Marital Status: Married    Spouse Name: N/A    Number of Children: N/A  . Years of Education: N/A   Occupational History  . Not on file.   Social History Main Topics  . Smoking status: Never Smoker   . Smokeless tobacco: Never Used  . Alcohol Use: Yes  . Drug Use: No  . Sexual Activity: Not on file   Other Topics Concern  . Not on file   Social History Narrative   Health Maintenance  Topic Date Due  . INFLUENZA VACCINE  03/13/2015  . PAP SMEAR  03/23/2015  . MAMMOGRAM  05/13/2016  . COLONOSCOPY  03/22/2022  . TETANUS/TDAP  04/16/2023    The following portions of the patient's history were reviewed and updated as appropriate: allergies, current medications, past family history, past medical history, past social history, past surgical history and problem list.  Review of Systems A comprehensive review of systems was negative.   Objective:  BP 116/68 mmHg  Pulse 83  Temp(Src) 98.2 F (36.8 C) (Oral)  Resp 16  Ht 5' 4.75" (1.645 m)  Wt 130 lb 12 oz (59.308 kg)  BMI 21.92 kg/m2  SpO2 98%   General appearance: alert, cooperative and appears stated age Head: Normocephalic, without obvious abnormality, atraumatic Eyes:  conjunctivae/corneas clear. PERRL, EOM's intact. Fundi benign. Ears: normal TM's and external ear canals both ears Nose: Nares normal. Septum midline. Mucosa normal. No drainage or sinus tenderness. Throat: lips, mucosa, and tongue normal; teeth and gums normal Neck: no adenopathy, no carotid bruit, no JVD, supple, symmetrical, trachea midline and thyroid not enlarged, symmetric, no tenderness/mass/nodules Lungs: clear to auscultation bilaterally Breasts: normal appearance, no masses or tenderness Heart: regular rate and rhythm, S1, S2 normal, no murmur, click, rub or gallop Abdomen: soft, non-tender; bowel sounds normal; no masses,  no organomegaly Extremities: extremities normal, atraumatic, no cyanosis or edema Pulses: 2+ and symmetric Skin: Skin color, texture, turgor normal. No rashes or lesions Neurologic: Alert and oriented X 3, normal strength and tone. Normal symmetric reflexes. Normal coordination and gait.    Assessment and Plan:    Generalized anxiety disorder Finally improved with citalopram  titrated to 30 mg and adjunctive psychotherapy.  Discussed weaning trial   Hyperlipidemia with target LDL less than 160 Mild, with no significant family history.  No indication for statins.   Encounter for preventive health examination Annual wellness  exam was done as well as a comprehensive physical exam and management of acute and chronic conditions .  During the course of the visit the patient was educated and counseled about appropriate  screening and preventive services including :  diabetes screening, lipid analysis with projected  10 year  risk for CAD , nutrition counseling, colorectal cancer screening, and recommended immunizations.  Printed recommendations for health maintenance screenings was given.    Updated Medication List Outpatient Encounter Prescriptions as of 06/29/2014  Medication Sig  . Cholecalciferol (VITAMIN D3) 1000 UNITS CHEW Chew 1 tablet by mouth daily.  .  citalopram (CELEXA) 10 MG tablet Take 1 tablet (10 mg total) by mouth daily. (Patient taking differently: Take 30 mg by mouth daily. )  . conjugated estrogens (PREMARIN) vaginal cream Place 1 Applicatorful vaginally as needed.  . ALPRAZolam (XANAX) 0.5 MG tablet Take 1 tablet (0.5 mg total) by mouth 2 (two) times daily as needed for anxiety.  . ergocalciferol (DRISDOL) 50000 UNITS capsule Take 1 capsule (50,000 Units total) by mouth once a week.  . fluticasone (FLONASE) 50 MCG/ACT nasal spray Place 2 sprays into the nose daily.  . ondansetron (ZOFRAN) 4 MG tablet Take 1 tablet (4 mg total) by mouth every 8 (eight) hours as needed for nausea or vomiting.  . Red Yeast Rice 600 MG CAPS Take 1 capsule by mouth 2 (two) times daily.

## 2014-06-29 NOTE — Patient Instructions (Addendum)
You had your annual  wellness exam today.  We will repeat your PAP smear in 2016, sooner if needed   You received the flu vaccine today.  If you develop a sore arm , use tylenol and ibuprofen for a few days   Your cholesterol has risen slightly since stopping the red yeast rice.   Goal LDL is 160 ,  Yours is 176   Try resuming  the red yeast rice  600 mg twice daily  I recommend getting the majority of your calcium and Vitamin D  through diet rather than supplements given the recent association of calcium supplements with increased coronary artery calcium scores (You need 1200 mg daily )   Unsweetened almond/coconut milk is a great low calorie low carb, cholesterol free  way to increase your dietary calcium and vitamin D.  Try the blue Diamond  brand

## 2014-07-02 NOTE — Assessment & Plan Note (Signed)

## 2014-07-02 NOTE — Assessment & Plan Note (Signed)
Mild, with no significant family history.  No indication for statins.

## 2014-07-02 NOTE — Assessment & Plan Note (Signed)
Finally improved with citalopram  titrated to 30 mg and adjunctive psychotherapy.  Discussed weaning trial

## 2015-04-21 ENCOUNTER — Telehealth: Payer: Self-pay | Admitting: Internal Medicine

## 2015-04-21 NOTE — Telephone Encounter (Signed)
Spoke with pt she states she was on Celexa for about a year.  Last taken 7.7.16.  Further states visual sensation followed by headaches returned after stopping Celexa.  Pt would like Dr Derrel Nip to advise whether she thinks its chemical or more anxiety.  Please advise

## 2015-04-21 NOTE — Telephone Encounter (Signed)
Pt has some questions regarding return of her headaches after stopping certain medication. Pt would like to speak to the nurse or Dr Derrel Nip. Please and Thank You!

## 2015-04-21 NOTE — Telephone Encounter (Signed)
That would be impossible for me to comment on since I have not seen her in 10 months!  She has been seeing Dr Nicolasa Ducking for her anxiety; she should discuss with her.

## 2015-04-24 NOTE — Telephone Encounter (Signed)
Spoke with pt, she states she has spoke with Dr Nicolasa Ducking.  States Dr Nicolasa Ducking is aware and advised pt to call when she needs to.

## 2015-06-28 ENCOUNTER — Telehealth: Payer: Self-pay | Admitting: Internal Medicine

## 2015-06-28 DIAGNOSIS — R5383 Other fatigue: Secondary | ICD-10-CM

## 2015-06-28 DIAGNOSIS — E559 Vitamin D deficiency, unspecified: Secondary | ICD-10-CM

## 2015-06-28 DIAGNOSIS — Z1159 Encounter for screening for other viral diseases: Secondary | ICD-10-CM

## 2015-06-28 DIAGNOSIS — E785 Hyperlipidemia, unspecified: Secondary | ICD-10-CM

## 2015-06-28 NOTE — Telephone Encounter (Signed)
Last Lab work was 06/27/14.  Please advise and I will get her scheduled.

## 2015-06-28 NOTE — Telephone Encounter (Signed)
Can be scheduled now.  Thanks.

## 2015-06-28 NOTE — Telephone Encounter (Signed)
Pt called wanting to get lab work done. Orders needed please and thank You!

## 2015-06-28 NOTE — Telephone Encounter (Signed)
Ok. Done 

## 2015-06-28 NOTE — Telephone Encounter (Signed)
Fasting labs ordered

## 2015-07-03 ENCOUNTER — Other Ambulatory Visit (INDEPENDENT_AMBULATORY_CARE_PROVIDER_SITE_OTHER): Payer: BLUE CROSS/BLUE SHIELD

## 2015-07-03 DIAGNOSIS — Z1159 Encounter for screening for other viral diseases: Secondary | ICD-10-CM

## 2015-07-03 DIAGNOSIS — E785 Hyperlipidemia, unspecified: Secondary | ICD-10-CM | POA: Diagnosis not present

## 2015-07-03 DIAGNOSIS — R5383 Other fatigue: Secondary | ICD-10-CM

## 2015-07-03 DIAGNOSIS — E559 Vitamin D deficiency, unspecified: Secondary | ICD-10-CM

## 2015-07-03 LAB — COMPREHENSIVE METABOLIC PANEL
ALT: 17 U/L (ref 0–35)
AST: 19 U/L (ref 0–37)
Albumin: 4.2 g/dL (ref 3.5–5.2)
Alkaline Phosphatase: 83 U/L (ref 39–117)
BUN: 12 mg/dL (ref 6–23)
CO2: 28 mEq/L (ref 19–32)
Calcium: 9.4 mg/dL (ref 8.4–10.5)
Chloride: 104 mEq/L (ref 96–112)
Creatinine, Ser: 0.72 mg/dL (ref 0.40–1.20)
GFR: 89.21 mL/min (ref >60.00)
Glucose, Bld: 96 mg/dL (ref 70–99)
Potassium: 3.9 mEq/L (ref 3.5–5.1)
Sodium: 139 mEq/L (ref 135–145)
Total Bilirubin: 0.5 mg/dL (ref 0.2–1.2)
Total Protein: 6.6 g/dL (ref 6.0–8.3)

## 2015-07-03 LAB — LIPID PANEL
Cholesterol: 251 mg/dL — ABNORMAL HIGH (ref 0–200)
HDL: 56.3 mg/dL (ref >39.00)
LDL Cholesterol: 169 mg/dL — ABNORMAL HIGH (ref 0–99)
NonHDL: 194.3
Total CHOL/HDL Ratio: 4
Triglycerides: 127 mg/dL (ref 0.0–149.0)
VLDL: 25.4 mg/dL (ref 0.0–40.0)

## 2015-07-03 LAB — TSH: TSH: 1.3 u[IU]/mL (ref 0.35–4.50)

## 2015-07-03 LAB — VITAMIN D 25 HYDROXY (VIT D DEFICIENCY, FRACTURES): VITD: 43.46 ng/mL (ref 30.00–100.00)

## 2015-07-04 LAB — CBC WITH DIFFERENTIAL/PLATELET
Basophils Absolute: 0 10*3/uL (ref 0.0–0.1)
Basophils Relative: 0.3 % (ref 0.0–3.0)
Eosinophils Absolute: 0.1 10*3/uL (ref 0.0–0.7)
Eosinophils Relative: 2 % (ref 0.0–5.0)
HCT: 41.6 % (ref 36.0–46.0)
Hemoglobin: 13.9 g/dL (ref 12.0–15.0)
Lymphocytes Relative: 38.8 % (ref 12.0–46.0)
Lymphs Abs: 1.8 10*3/uL (ref 0.7–4.0)
MCHC: 33.5 g/dL (ref 30.0–36.0)
MCV: 91.8 fl (ref 78.0–100.0)
Monocytes Absolute: 0.2 10*3/uL (ref 0.1–1.0)
Monocytes Relative: 5.5 % (ref 3.0–12.0)
Neutro Abs: 2.4 10*3/uL (ref 1.4–7.7)
Neutrophils Relative %: 53.4 % (ref 43.0–77.0)
Platelets: 291 10*3/uL (ref 150.0–400.0)
RBC: 4.53 Mil/uL (ref 3.87–5.11)
RDW: 12.7 % (ref 11.5–15.5)
WBC: 4.5 10*3/uL (ref 4.0–10.5)

## 2015-07-04 LAB — HEPATITIS C ANTIBODY: HCV Ab: NEGATIVE

## 2015-07-07 ENCOUNTER — Encounter: Payer: Self-pay | Admitting: Internal Medicine

## 2015-07-10 ENCOUNTER — Encounter: Payer: Self-pay | Admitting: Internal Medicine

## 2015-07-10 ENCOUNTER — Ambulatory Visit (INDEPENDENT_AMBULATORY_CARE_PROVIDER_SITE_OTHER): Payer: BLUE CROSS/BLUE SHIELD | Admitting: Internal Medicine

## 2015-07-10 ENCOUNTER — Other Ambulatory Visit (HOSPITAL_COMMUNITY)
Admission: RE | Admit: 2015-07-10 | Discharge: 2015-07-10 | Disposition: A | Discharge status: Home / Self Care | Payer: BLUE CROSS/BLUE SHIELD | Source: Ambulatory Visit | Attending: Internal Medicine | Admitting: Internal Medicine

## 2015-07-10 VITALS — BP 108/72 | HR 65 | Temp 98.0°F | Resp 12 | Ht 65.5 in | Wt 153.5 lb

## 2015-07-10 DIAGNOSIS — Z1151 Encounter for screening for human papillomavirus (HPV): Secondary | ICD-10-CM | POA: Insufficient documentation

## 2015-07-10 DIAGNOSIS — E785 Hyperlipidemia, unspecified: Secondary | ICD-10-CM

## 2015-07-10 DIAGNOSIS — T50905A Adverse effect of unspecified drugs, medicaments and biological substances, initial encounter: Secondary | ICD-10-CM

## 2015-07-10 DIAGNOSIS — Z1239 Encounter for other screening for malignant neoplasm of breast: Secondary | ICD-10-CM | POA: Diagnosis not present

## 2015-07-10 DIAGNOSIS — F411 Generalized anxiety disorder: Secondary | ICD-10-CM

## 2015-07-10 DIAGNOSIS — Z0000 Encounter for general adult medical examination without abnormal findings: Secondary | ICD-10-CM

## 2015-07-10 DIAGNOSIS — Z124 Encounter for screening for malignant neoplasm of cervix: Secondary | ICD-10-CM

## 2015-07-10 DIAGNOSIS — Z23 Encounter for immunization: Secondary | ICD-10-CM | POA: Diagnosis not present

## 2015-07-10 DIAGNOSIS — G43009 Migraine without aura, not intractable, without status migrainosus: Secondary | ICD-10-CM

## 2015-07-10 DIAGNOSIS — Z01419 Encounter for gynecological examination (general) (routine) without abnormal findings: Secondary | ICD-10-CM | POA: Diagnosis present

## 2015-07-10 DIAGNOSIS — R635 Abnormal weight gain: Secondary | ICD-10-CM

## 2015-07-10 NOTE — Patient Instructions (Signed)
The Red Yeast Rice has lowered your LDL.  Regular exercise will improve your HDL which has dropped a little  The  diet I discussed with you today is the 10 day Green Smoothie Cleansing /Detox Diet by Linden Dolin . available on [place]  for around $10.  This is not a low carb or a weight loss diet,  It is fundamentally a "cleansing" low fat diet that eliminates sugar, gluten, caffeine, alcohol and dairy for 10 days .  What you add back after the initial ten days is entirely up to  you!  You can expect to lose 5 to 10 lbs depending on how strict you are.   I found that  drinking 2 smoothies or juices  daily and keeping one chewable meal (but keep it simple, like baked fish and salad, rice or bok choy) kept me satisfied and kept me from straying  .  You snack primarily on fresh  fruit, egg whites and judicious quantities of nuts. I  Recommend adding a  vegetable based protein powder  To any smoothie made with almond milk.  (nothing with whey , since whey is dairy) in it.  [employer] has a [employer] officer, political party .   It does require some form of a nutrient extractor (Vita Mix, a electric juicer,  Or a Nutribullet Rx).  i have found that using frozen fruits is much more convenient and cost effective. You can even find plenty of organic fruit in the frozen fruit section of [employer].  Just thaw what you need for the following day the night before in the refrigerator (to avoid jamming up your machine)    The organic greens drink I use if I don't have any fresh greens  is called "Suja" and it's sold in the vegetable refrigerated section of most grocery stores (including [employer])  . It is tart, though, so be careful (has lemon juice in it )  The organic vegan protein powder I tried  is called Vega" and I found it at [employer] .  It is sugar free  Menopause is a normal process in which your reproductive ability comes to an end. This process happens gradually over a span of months to years, usually between the ages of 14 and 34.  Menopause is complete when you have missed 12 consecutive menstrual periods. It is important to talk with your health care provider about some of the most common conditions that affect postmenopausal women, such as heart disease, cancer, and bone loss (osteoporosis). Adopting a healthy lifestyle and getting preventive care can help to promote your health and wellness. Those actions can also lower your chances of developing some of these common conditions. WHAT SHOULD I KNOW ABOUT MENOPAUSE? During menopause, you may experience a number of symptoms, such as:  Moderate-to-severe hot flashes.  Night sweats.  Decrease in sex drive.  Mood swings.  Headaches.  Tiredness.  Irritability.  Memory problems.  Insomnia. Choosing to treat or not to treat menopausal changes is an individual decision that you make with your health care provider. WHAT SHOULD I KNOW ABOUT HORMONE REPLACEMENT THERAPY AND SUPPLEMENTS? Hormone therapy products are effective for treating symptoms that are associated with menopause, such as hot flashes and night sweats. Hormone replacement carries certain risks, especially as you become older. If you are thinking about using estrogen or estrogen with progestin treatments, discuss the benefits and risks with your health care provider. WHAT SHOULD I KNOW ABOUT HEART DISEASE AND STROKE? Heart disease, heart attack, and stroke become  more likely as you age. This may be due, in part, to the hormonal changes that your body experiences during menopause. These can affect how your body processes dietary fats, triglycerides, and cholesterol. Heart attack and stroke are both medical emergencies. There are many things that you can do to help prevent heart disease and stroke:  Have your blood pressure checked at least every 1-2 years. High blood pressure causes heart disease and increases the risk of stroke.  If you are 48-66 years old, ask your health care provider if you should take  aspirin to prevent a heart attack or a stroke.  Do not use any tobacco products, including cigarettes, chewing tobacco, or electronic cigarettes. If you need help quitting, ask your health care provider.  It is important to eat a healthy diet and maintain a healthy weight.  Be sure to include plenty of vegetables, fruits, low-fat dairy products, and lean protein.  Avoid eating foods that are high in solid fats, added sugars, or salt (sodium).  Get regular exercise. This is one of the most important things that you can do for your health.  Try to exercise for at least 150 minutes each week. The type of exercise that you do should increase your heart rate and make you sweat. This is known as moderate-intensity exercise.  Try to do strengthening exercises at least twice each week. Do these in addition to the moderate-intensity exercise.  Know your numbers.Ask your health care provider to check your cholesterol and your blood glucose. Continue to have your blood tested as directed by your health care provider. WHAT SHOULD I KNOW ABOUT CANCER SCREENING? There are several types of cancer. Take the following steps to reduce your risk and to catch any cancer development as early as possible. Breast Cancer  Practice breast self-awareness.  This means understanding how your breasts normally appear and feel.  It also means doing regular breast self-exams. Let your health care provider know about any changes, no matter how small.  If you are 51 or older, have a clinician do a breast exam (clinical breast exam or CBE) every year. Depending on your age, family history, and medical history, it may be recommended that you also have a yearly breast X-ray (mammogram).  If you have a family history of breast cancer, talk with your health care provider about genetic screening.  If you are at high risk for breast cancer, talk with your health care provider about having an MRI and a mammogram every  year.  Breast cancer (BRCA) gene test is recommended for women who have family members with BRCA-related cancers. Results of the assessment will determine the need for genetic counseling and BRCA1 and for BRCA2 testing. BRCA-related cancers include these types:  Breast. This occurs in males or females.  Ovarian.  Tubal. This may also be called fallopian tube cancer.  Cancer of the abdominal or pelvic lining (peritoneal cancer).  Prostate.  Pancreatic. Cervical, Uterine, and Ovarian Cancer Your health care provider may recommend that you be screened regularly for cancer of the pelvic organs. These include your ovaries, uterus, and vagina. This screening involves a pelvic exam, which includes checking for microscopic changes to the surface of your cervix (Pap test).  For women ages 21-65, health care providers may recommend a pelvic exam and a Pap test every three years. For women ages 39-65, they may recommend the Pap test and pelvic exam, combined with testing for human papilloma virus (HPV), every five years. Some types of HPV  increase your risk of cervical cancer. Testing for HPV may also be done on women of any age who have unclear Pap test results.  Other health care providers may not recommend any screening for nonpregnant women who are considered low risk for pelvic cancer and have no symptoms. Ask your health care provider if a screening pelvic exam is right for you.  If you have had past treatment for cervical cancer or a condition that could lead to cancer, you need Pap tests and screening for cancer for at least 20 years after your treatment. If Pap tests have been discontinued for you, your risk factors (such as having a new sexual partner) need to be reassessed to determine if you should start having screenings again. Some women have medical problems that increase the chance of getting cervical cancer. In these cases, your health care provider may recommend that you have screening  and Pap tests more often.  If you have a family history of uterine cancer or ovarian cancer, talk with your health care provider about genetic screening.  If you have vaginal bleeding after reaching menopause, tell your health care provider.  There are currently no reliable tests available to screen for ovarian cancer. Lung Cancer Lung cancer screening is recommended for adults 76-38 years old who are at high risk for lung cancer because of a history of smoking. A yearly low-dose CT scan of the lungs is recommended if you:  Currently smoke.  Have a history of at least 30 pack-years of smoking and you currently smoke or have quit within the past 15 years. A pack-year is smoking an average of one pack of cigarettes per day for one year. Yearly screening should:  Continue until it has been 15 years since you quit.  Stop if you develop a health problem that would prevent you from having lung cancer treatment. Colorectal Cancer  This type of cancer can be detected and can often be prevented.  Routine colorectal cancer screening usually begins at age 68 and continues through age 52.  If you have risk factors for colon cancer, your health care provider may recommend that you be screened at an earlier age.  If you have a family history of colorectal cancer, talk with your health care provider about genetic screening.  Your health care provider may also recommend using home test kits to check for hidden blood in your stool.  A small camera at the end of a tube can be used to examine your colon directly (sigmoidoscopy or colonoscopy). This is done to check for the earliest forms of colorectal cancer.  Direct examination of the colon should be repeated every 5-10 years until age 49. However, if early forms of precancerous polyps or small growths are found or if you have a family history or genetic risk for colorectal cancer, you may need to be screened more often. Skin Cancer  Check your skin  from head to toe regularly.  Monitor any moles. Be sure to tell your health care provider:  About any new moles or changes in moles, especially if there is a change in a mole's shape or color.  If you have a mole that is larger than the size of a pencil eraser.  If any of your family members has a history of skin cancer, especially at a young age, talk with your health care provider about genetic screening.  Always use sunscreen. Apply sunscreen liberally and repeatedly throughout the day.  Whenever you are outside, protect yourself  by wearing long sleeves, pants, a wide-brimmed hat, and sunglasses. WHAT SHOULD I KNOW ABOUT OSTEOPOROSIS? Osteoporosis is a condition in which bone destruction happens more quickly than new bone creation. After menopause, you may be at an increased risk for osteoporosis. To help prevent osteoporosis or the bone fractures that can happen because of osteoporosis, the following is recommended:  If you are 70-54 years old, get at least 1,000 mg of calcium and at least 600 mg of vitamin D per day.  If you are older than age 52 but younger than age 12, get at least 1,200 mg of calcium and at least 600 mg of vitamin D per day.  If you are older than age 61, get at least 1,200 mg of calcium and at least 800 mg of vitamin D per day. Smoking and excessive alcohol intake increase the risk of osteoporosis. Eat foods that are rich in calcium and vitamin D, and do weight-bearing exercises several times each week as directed by your health care provider. WHAT SHOULD I KNOW ABOUT HOW MENOPAUSE AFFECTS [place]? Depression may occur at any age, but it is more common as you become older. Common symptoms of depression include:  Low or sad mood.  Changes in sleep patterns.  Changes in appetite or eating patterns.  Feeling an overall lack of motivation or enjoyment of activities that you previously enjoyed.  Frequent crying spells. Talk with your health care  provider if you think that you are experiencing depression. WHAT SHOULD I KNOW ABOUT IMMUNIZATIONS? It is important that you get and maintain your immunizations. These include:  Tetanus, diphtheria, and pertussis (Tdap) booster vaccine.  Influenza every year before the flu season begins.  Pneumonia vaccine.  Shingles vaccine. Your health care provider may also recommend other immunizations.   This information is not intended to replace advice given to you by your health care provider. Make sure you discuss any questions you have with your health care provider.   Document Released: 09/20/2005 Document Revised: 08/19/2014 Document Reviewed: 03/31/2014 Elsevier Interactive Patient Education [employer].

## 2015-07-10 NOTE — Progress Notes (Signed)
Patient ID: Kristine Mcguire, female    DOB: Jul 06, 1961  Age: 54 y.o. MRN: [id number]  The patient is here for annual physical  examination and management of other chronic and acute problems.   The risk factors are reflected in the social history.  The roster of all physicians providing medical care to patient - is listed in the Snapshot section of the chart.  Home safety : The patient has smoke detectors in the home. They wear seatbelts.  There are no firearms at home. There is no violence in the home.   There is no risks for hepatitis, STDs or HIV. There is no   history of blood transfusion. They have no travel history to infectious disease endemic areas of the world.  The patient has seen their dentist in the last six month. They have seen their eye doctor in the last year. They admit to slight hearing difficulty with regard to whispered voices and some television programs.  They have deferred audiologic testing in the last year.  They do not  have excessive sun exposure. Discussed the need for sun protection: hats, long sleeves and use of sunscreen if there is significant sun exposure.   Diet: the importance of a healthy diet is discussed. They do have a healthy diet.  The benefits of regular aerobic exercise were discussed. She walks 4 times per week ,  20 minutes.   Depression screen: there are no signs or vegative symptoms of depression- irritability, change in appetite, anhedonia, sadness/tearfullness.  The following portions of the patient's history were reviewed and updated as appropriate: allergies, current medications, past family history, past medical history,  past surgical history, past social history  and problem list.  Visual acuity was not assessed per patient preference since she has regular follow up with her ophthalmologist. Hearing and body mass index were assessed and reviewed.   During the course of the visit the patient was educated and counseled about appropriate  screening and preventive services including : fall prevention , diabetes screening, nutrition counseling, colorectal cancer screening, and recommended immunizations.    CC: The primary encounter diagnosis was Cervical cancer screening. Diagnoses of Breast cancer screening, Encounter for immunization, Encounter for preventive health examination, Hyperlipidemia with target LDL less than 160, Migraine without aura and without status migrainosus, not intractable, Generalized anxiety disorder, and Weight gain due to medication were also pertinent to this visit.   Her history of migraines with visual aura started recurring after stopping celexa with Dr Nicolasa Ducking.  They were occurring once or twice weekly,  Then had none for a few weeks,  Then in Mid October starting have 3-4/week, mild accompanied  by anxiety bc of the visual aura she gets with it . Had one in the middle of the night which woke her up.   Dr Nicolasa Ducking resumed the  Celexa and she has had no episodes since Nov 11   Has gained 23 lbs since last year due to resolution of anxiety which was causing anorexia.  Goal is 135 lbs.    History Kristine Mcguire has a past medical history of History of chicken pox; UTI (urinary tract infection); and Migraine.   She has past surgical history that includes Cesarean section (1992) and Debridement tennis elbow (208).   Her family history includes Alzheimer's disease in her paternal grandmother; Cancer in her father and maternal aunt; Heart attack (age of onset: 70) in her maternal uncle; Heart attack (age of onset: 31) in her maternal grandfather; Heart attack (age  of onset: 30) in her maternal grandmother; Hyperlipidemia in her mother; Hypertension (age of onset: 70) in her mother; Pulmonary fibrosis in her maternal grandmother and paternal uncle; Pulmonary fibrosis (age of onset: 48) in her brother.She reports that she has never smoked. She has never used smokeless tobacco. She reports that she drinks alcohol. She reports  that she does not use illicit drugs.  Outpatient Prescriptions Prior to Visit  Medication Sig Dispense Refill  . Cholecalciferol (VITAMIN D3) 1000 UNITS CHEW Chew 2 tablets by mouth daily.     Marland Kitchen conjugated estrogens (PREMARIN) vaginal cream Place 1 Applicatorful vaginally as needed. 42.5 g 3  . Red Yeast Rice 600 MG CAPS Take 1 capsule by mouth 2 (two) times daily.    . citalopram (CELEXA) 10 MG tablet Take 1 tablet (10 mg total) by mouth daily. (Patient taking differently: Take 30 mg by mouth daily. ) 30 tablet 3  . ALPRAZolam (XANAX) 0.5 MG tablet Take 1 tablet (0.5 mg total) by mouth 2 (two) times daily as needed for anxiety. (Patient not taking: Reported on 07/10/2015) 30 tablet 4  . ergocalciferol (DRISDOL) 50000 UNITS capsule Take 1 capsule (50,000 Units total) by mouth once a week. 12 capsule 0  . fluticasone (FLONASE) 50 MCG/ACT nasal spray Place 2 sprays into the nose daily. 16 g 6  . ondansetron (ZOFRAN) 4 MG tablet Take 1 tablet (4 mg total) by mouth every 8 (eight) hours as needed for nausea or vomiting. 20 tablet 2   No facility-administered medications prior to visit.    Review of Systems   Patient denies headache, fevers, malaise, unintentional weight loss, skin rash, eye pain, sinus congestion and sinus pain, sore throat, dysphagia,  hemoptysis , cough, dyspnea, wheezing, chest pain, palpitations, orthopnea, edema, abdominal pain, nausea, melena, diarrhea, constipation, flank pain, dysuria, hematuria, urinary  Frequency, nocturia, numbness, tingling, seizures,  Focal weakness, Loss of consciousness,  Tremor, insomnia, depression, anxiety, and suicidal ideation.      Objective:  BP 108/72 mmHg  Pulse 65  Temp(Src) 98 F (36.7 C) (Oral)  Resp 12  Ht 5' 5.5" (1.664 m)  Wt 153 lb 8 oz (69.627 kg)  BMI 25.15 kg/m2  SpO2 98%  Physical Exam   General Appearance:    Alert, cooperative, no distress, appears stated age  Head:    Normocephalic, without obvious abnormality,  atraumatic  Eyes:    PERRL, conjunctiva/corneas clear, EOM's intact, fundi    benign, both eyes  Ears:    Normal TM's and external ear canals, both ears  Nose:   Nares normal, septum midline, mucosa normal, no drainage    or sinus tenderness  Throat:   Lips, mucosa, and tongue normal; teeth and gums normal  Neck:   Supple, symmetrical, trachea midline, no adenopathy;    thyroid:  no enlargement/tenderness/nodules; no carotid   bruit or JVD  Back:     Symmetric, no curvature, ROM normal, no CVA tenderness  Lungs:     Clear to auscultation bilaterally, respirations unlabored  Chest Wall:    No tenderness or deformity   Heart:    Regular rate and rhythm, S1 and S2 normal, no murmur, rub   or gallop  Breast Exam:    No tenderness, masses, or nipple abnormality  Abdomen:     Soft, non-tender, bowel sounds active all four quadrants,    no masses, no organomegaly  Genitalia:    Pelvic: cervix normal in appearance, external genitalia normal, no adnexal masses or tenderness, no  cervical motion tenderness, rectovaginal septum normal, uterus normal size, shape, and consistency and vagina normal without discharge  Extremities:   Extremities normal, atraumatic, no cyanosis or edema  Pulses:   2+ and symmetric all extremities  Skin:   Skin color, texture, turgor normal, no rashes or lesions  Lymph nodes:   Cervical, supraclavicular, and axillary nodes normal  Neurologic:   CNII-XII intact, normal strength, sensation and reflexes    throughout     Assessment & Plan:   Problem List Items Addressed This Visit    Encounter for preventive health examination    Annual comprehensive preventive exam was done as well as an evaluation and management of chronic conditions .  During the course of the visit the patient was educated and counseled about appropriate screening and preventive services including :  diabetes screening, lipid analysis with projected  10 year  risk for CAD which is 4.5% using the  [place] risk calculator for women, , nutrition counseling, colorectal cancer screening, and recommended immunizations.  Printed recommendations for health maintenance screenings was given.   Mammogram yearly up to date PAP smear done Colonoscopy up to date.       Hyperlipidemia with target LDL less than 160    Mild, with no significant family history.  No indication for statins.  10 yr risk is 4.5% using FRC  Lab Results  Component Value Date   CHOL 251* 07/03/2015   HDL 56.30 07/03/2015   LDLCALC 169* 07/03/2015   LDLDIRECT 174.1 04/15/2013   TRIG 127.0 07/03/2015   CHOLHDL 4 07/03/2015         Migraine    Frequency has resolved with management of GAD with celexa.       Relevant Medications   citalopram (CELEXA) 10 MG tablet   Generalized anxiety disorder    Finally improved with citalopram  titrated to 30 mg and adjunctive psychotherapy. weaning trial was unsuccessful recently due to recurrence  Of frequency migraines with visual aura       Weight gain due to medication    Secondary to SSRI therapy for GAD,  Which caused a significant unwelcome weight loss.  Discussed diet and exercise with patient.  Goal wt is 135 lbs        Other Visit Diagnoses    Cervical cancer screening    -  Primary    Relevant Orders    Cytology - PAP    Breast cancer screening        Relevant Orders    MM DIGITAL SCREENING BILATERAL    Encounter for immunization           I have discontinued Ms. [place] fluticasone, ergocalciferol, ALPRAZolam, and ondansetron. I have also changed her citalopram. Additionally, I am having her maintain her conjugated estrogens, Red Yeast Rice, and Vitamin D3.  Meds ordered this encounter  Medications  . citalopram (CELEXA) 10 MG tablet    Sig: Take 3 tablets (30 mg total) by mouth daily.    Dispense:  30 tablet    Refill:  3    Medications Discontinued During This Encounter  Medication Reason  . ergocalciferol (DRISDOL) 50000 UNITS capsule  Completed Course  . fluticasone (FLONASE) 50 MCG/ACT nasal spray Patient Preference  . ondansetron (ZOFRAN) 4 MG tablet Error  . ALPRAZolam (XANAX) 0.5 MG tablet Patient Preference  . ALPRAZolam (XANAX) 0.5 MG tablet Patient Preference  . citalopram (CELEXA) 10 MG tablet Reorder    Follow-up: Return in about 1 year (around 07/09/2016).  [name], MD

## 2015-07-10 NOTE — Progress Notes (Signed)
Pre-visit discussion using our clinic review tool. No additional management support is needed unless otherwise documented below in the visit note.  

## 2015-07-11 ENCOUNTER — Encounter: Payer: Self-pay | Admitting: Internal Medicine

## 2015-07-11 DIAGNOSIS — R635 Abnormal weight gain: Secondary | ICD-10-CM | POA: Insufficient documentation

## 2015-07-11 DIAGNOSIS — T50905A Adverse effect of unspecified drugs, medicaments and biological substances, initial encounter: Secondary | ICD-10-CM

## 2015-07-11 LAB — CYTOLOGY - PAP

## 2015-07-11 MED ORDER — CITALOPRAM HYDROBROMIDE 10 MG PO TABS: 30.0000 mg | ORAL_TABLET | Freq: Every day | ORAL | Status: DC

## 2015-07-11 NOTE — Assessment & Plan Note (Signed)
Secondary to SSRI therapy for GAD,  Which caused a significant unwelcome weight loss.  Discussed diet and exercise with patient.  Goal wt is 135 lbs

## 2015-07-11 NOTE — Assessment & Plan Note (Addendum)
Annual comprehensive preventive exam was done as well as an evaluation and management of chronic conditions .  During the course of the visit the patient was educated and counseled about appropriate screening and preventive services including :  diabetes screening, lipid analysis with projected  10 year  risk for CAD which is 4.5% using the Framingham risk calculator for women, , nutrition counseling, colorectal cancer screening, and recommended immunizations.  Printed recommendations for health maintenance screenings was given.   Mammogram yearly up to date PAP smear done Colonoscopy up to date.

## 2015-07-11 NOTE — Assessment & Plan Note (Signed)
Frequency has resolved with management of GAD with celexa.

## 2015-07-11 NOTE — Assessment & Plan Note (Signed)
Finally improved with citalopram  titrated to 30 mg and adjunctive psychotherapy. weaning trial was unsuccessful recently due to recurrence  Of frequency migraines with visual aura

## 2015-07-11 NOTE — Assessment & Plan Note (Signed)
Mild, with no significant family history.  No indication for statins.  10 yr risk is 4.5% using FRC  Lab Results  Component Value Date   CHOL 251* 07/03/2015   HDL 56.30 07/03/2015   LDLCALC 169* 07/03/2015   LDLDIRECT 174.1 04/15/2013   TRIG 127.0 07/03/2015   CHOLHDL 4 07/03/2015

## 2015-07-12 ENCOUNTER — Encounter: Payer: Self-pay | Admitting: Internal Medicine

## 2015-09-06 ENCOUNTER — Other Ambulatory Visit: Payer: Self-pay | Admitting: Internal Medicine

## 2015-09-06 ENCOUNTER — Ambulatory Visit
Admission: RE | Admit: 2015-09-06 | Discharge: 2015-09-06 | Disposition: A | Discharge status: Home / Self Care | Payer: BLUE CROSS/BLUE SHIELD | Source: Ambulatory Visit | Attending: Internal Medicine | Admitting: Internal Medicine

## 2015-09-06 DIAGNOSIS — Z1231 Encounter for screening mammogram for malignant neoplasm of breast: Secondary | ICD-10-CM | POA: Insufficient documentation

## 2015-09-06 DIAGNOSIS — Z1239 Encounter for other screening for malignant neoplasm of breast: Secondary | ICD-10-CM

## 2015-11-10 ENCOUNTER — Other Ambulatory Visit: Payer: Self-pay | Admitting: Internal Medicine

## 2016-03-09 ENCOUNTER — Other Ambulatory Visit: Payer: Self-pay | Admitting: Internal Medicine

## 2016-03-15 ENCOUNTER — Ambulatory Visit (INDEPENDENT_AMBULATORY_CARE_PROVIDER_SITE_OTHER): Payer: BLUE CROSS/BLUE SHIELD | Admitting: Family Medicine

## 2016-03-15 DIAGNOSIS — H6092 Unspecified otitis externa, left ear: Secondary | ICD-10-CM

## 2016-03-15 DIAGNOSIS — H6090 Unspecified otitis externa, unspecified ear: Secondary | ICD-10-CM | POA: Insufficient documentation

## 2016-03-15 MED ORDER — NEOMYCIN-POLYMYXIN-HC 3.5-10000-1 OT SOLN: 4.0000 [drp] | Freq: Four times a day (QID) | OTIC | 0 refills | Status: DC

## 2016-03-15 NOTE — Progress Notes (Signed)
  Tommi Rumps, MD Phone: [phone]  Kristine Mcguire is a 55 y.o. female who presents today for same-day visit.  Left ear pain: Patient notes for last 4-5 days she's had some discomfort in her left ear. Started out with itching and possible liquid penis. There became sore and painful yesterday. No upper respiratory symptoms. Notes it feels as though she has swimmer's ear. No recent swimming. She tried peroxide yesterday with a little benefit. No tinnitus or vertigo.   ROS see history of present illness  Objective  Physical Exam Vitals:   03/15/16 1303  BP: 112/64  Pulse: 66  Temp: 98.1 F (36.7 C)    BP Readings from Last 3 Encounters:  03/15/16 112/64  07/10/15 108/72  06/29/14 116/68   Wt Readings from Last 3 Encounters:  03/15/16 138 lb (62.6 kg)  07/10/15 153 lb 8 oz (69.6 kg)  06/29/14 130 lb 12 oz (59.3 kg)    Physical Exam  Constitutional: She is well-developed, well-nourished, and in no distress.  HENT:  Right ear canal and TM normal, left TM normal, left ear canal mildly erythematous and edematous with no drainage  Cardiovascular: Normal rate and regular rhythm.   Pulmonary/Chest: Effort normal and breath sounds normal.   left ear canal was irrigated due to wax buildup. After irrigation exam revealed the above findings.   Assessment/Plan: Please see individual problem list.  Otitis externa Patient with mild to moderate otitis externa on the left. We will treat with Cortisporin. She'll continue to monitor.   No orders of the defined types were placed in this encounter.   Meds ordered this encounter  Medications  . neomycin-polymyxin-hydrocortisone (CORTISPORIN) otic solution    Sig: Place 4 drops into the left ear 4 (four) times daily. For 7 days    Dispense:  10 mL    Refill:  0   Tommi Rumps, MD [place]

## 2016-03-15 NOTE — Assessment & Plan Note (Addendum)
Patient with mild to moderate otitis externa on the left. We will treat with Cortisporin. She'll continue to monitor.

## 2016-03-15 NOTE — Patient Instructions (Signed)

## 2016-03-15 NOTE — Progress Notes (Signed)
Pre visit review using our clinic review tool, if applicable. No additional management support is needed unless otherwise documented below in the visit note. 

## 2016-03-25 ENCOUNTER — Encounter: Payer: Self-pay | Admitting: Internal Medicine

## 2016-03-25 MED ORDER — CITALOPRAM HYDROBROMIDE 20 MG PO TABS: 20.0000 mg | ORAL_TABLET | Freq: Every day | ORAL | 0 refills | Status: DC

## 2016-04-22 ENCOUNTER — Encounter: Payer: Self-pay | Admitting: Internal Medicine

## 2016-04-25 ENCOUNTER — Other Ambulatory Visit: Payer: Self-pay | Admitting: *Deleted

## 2016-04-25 MED ORDER — CITALOPRAM HYDROBROMIDE 20 MG PO TABS: 20.0000 mg | ORAL_TABLET | Freq: Every day | ORAL | 2 refills | Status: DC

## 2016-05-17 ENCOUNTER — Other Ambulatory Visit: Payer: Self-pay | Admitting: Internal Medicine

## 2016-07-15 ENCOUNTER — Encounter: Payer: Self-pay | Admitting: Internal Medicine

## 2016-07-15 ENCOUNTER — Ambulatory Visit (INDEPENDENT_AMBULATORY_CARE_PROVIDER_SITE_OTHER): Payer: BLUE CROSS/BLUE SHIELD | Admitting: Internal Medicine

## 2016-07-15 VITALS — BP 122/70 | HR 57 | Temp 98.7°F | Resp 12 | Ht 65.75 in | Wt 140.5 lb

## 2016-07-15 DIAGNOSIS — F411 Generalized anxiety disorder: Secondary | ICD-10-CM

## 2016-07-15 DIAGNOSIS — E785 Hyperlipidemia, unspecified: Secondary | ICD-10-CM | POA: Diagnosis not present

## 2016-07-15 DIAGNOSIS — E559 Vitamin D deficiency, unspecified: Secondary | ICD-10-CM | POA: Diagnosis not present

## 2016-07-15 DIAGNOSIS — Z1231 Encounter for screening mammogram for malignant neoplasm of breast: Secondary | ICD-10-CM

## 2016-07-15 DIAGNOSIS — R5383 Other fatigue: Secondary | ICD-10-CM | POA: Diagnosis not present

## 2016-07-15 DIAGNOSIS — Z0000 Encounter for general adult medical examination without abnormal findings: Secondary | ICD-10-CM | POA: Diagnosis not present

## 2016-07-15 DIAGNOSIS — Z1239 Encounter for other screening for malignant neoplasm of breast: Secondary | ICD-10-CM

## 2016-07-15 DIAGNOSIS — G43109 Migraine with aura, not intractable, without status migrainosus: Secondary | ICD-10-CM | POA: Diagnosis not present

## 2016-07-15 LAB — COMPREHENSIVE METABOLIC PANEL
ALT: 19 U/L (ref 0–35)
AST: 19 U/L (ref 0–37)
Albumin: 4.3 g/dL (ref 3.5–5.2)
Alkaline Phosphatase: 84 U/L (ref 39–117)
BUN: 14 mg/dL (ref 6–23)
CO2: 30 mEq/L (ref 19–32)
Calcium: 9.7 mg/dL (ref 8.4–10.5)
Chloride: 103 mEq/L (ref 96–112)
Creatinine, Ser: 0.8 mg/dL (ref 0.40–1.20)
GFR: 78.71 mL/min (ref >60.00)
Glucose, Bld: 101 mg/dL — ABNORMAL HIGH (ref 70–99)
Potassium: 4.4 mEq/L (ref 3.5–5.1)
Sodium: 140 mEq/L (ref 135–145)
Total Bilirubin: 0.5 mg/dL (ref 0.2–1.2)
Total Protein: 6.5 g/dL (ref 6.0–8.3)

## 2016-07-15 LAB — CBC WITH DIFFERENTIAL/PLATELET
Basophils Absolute: 0 10*3/uL (ref 0.0–0.1)
Basophils Relative: 0.5 % (ref 0.0–3.0)
Eosinophils Absolute: 0.1 10*3/uL (ref 0.0–0.7)
Eosinophils Relative: 2.5 % (ref 0.0–5.0)
HCT: 41.5 % (ref 36.0–46.0)
Hemoglobin: 13.9 g/dL (ref 12.0–15.0)
Lymphocytes Relative: 32.5 % (ref 12.0–46.0)
Lymphs Abs: 1.5 10*3/uL (ref 0.7–4.0)
MCHC: 33.5 g/dL (ref 30.0–36.0)
MCV: 91.1 fl (ref 78.0–100.0)
Monocytes Absolute: 0.4 10*3/uL (ref 0.1–1.0)
Monocytes Relative: 7.6 % (ref 3.0–12.0)
Neutro Abs: 2.6 10*3/uL (ref 1.4–7.7)
Neutrophils Relative %: 56.9 % (ref 43.0–77.0)
Platelets: 329 10*3/uL (ref 150.0–400.0)
RBC: 4.55 Mil/uL (ref 3.87–5.11)
RDW: 12.9 % (ref 11.5–15.5)
WBC: 4.6 10*3/uL (ref 4.0–10.5)

## 2016-07-15 LAB — LIPID PANEL
Cholesterol: 270 mg/dL — ABNORMAL HIGH (ref 0–200)
HDL: 60 mg/dL (ref >39.00)
LDL Cholesterol: 183 mg/dL — ABNORMAL HIGH (ref 0–99)
NonHDL: 209.86
Total CHOL/HDL Ratio: 4
Triglycerides: 134 mg/dL (ref 0.0–149.0)
VLDL: 26.8 mg/dL (ref 0.0–40.0)

## 2016-07-15 LAB — VITAMIN D 25 HYDROXY (VIT D DEFICIENCY, FRACTURES): VITD: 48.01 ng/mL (ref 30.00–100.00)

## 2016-07-15 LAB — TSH: TSH: 1.01 u[IU]/mL (ref 0.35–4.50)

## 2016-07-15 MED ORDER — CITALOPRAM HYDROBROMIDE 20 MG PO TABS: 20.0000 mg | ORAL_TABLET | Freq: Every day | ORAL | 5 refills | Status: DC

## 2016-07-15 NOTE — Progress Notes (Signed)
Pre-visit discussion using our clinic review tool. No additional management support is needed unless otherwise documented below in the visit note.  

## 2016-07-15 NOTE — Progress Notes (Signed)
Patient ID: Kristine Mcguire, female    DOB: 1961-07-10  Age: 55 y.o. MRN: [id number]  The patient is here for annual physical  examination and management of other chronic and acute problems.  PAP smear normal 2016 Mammogram jan 2017 Colonoscopy 2013 normal Kristine Mcguire.  No fam history    The risk factors are reflected in the social history.  The roster of all physicians providing medical care to patient - is listed in the Snapshot section of the chart.  Home safety : The patient has smoke detectors in the home. They wear seatbelts.  There are no firearms at home. There is no violence in the home.   There is no risks for hepatitis, STDs or HIV. There is no   history of blood transfusion. They have no travel history to infectious disease endemic areas of the world.  The patient has seen their dentist in the last six month. They have seen their eye doctor in the last year.   They do not  have excessive sun exposure. Discussed the need for sun protection: hats, long sleeves and use of sunscreen if there is significant sun exposure. Sees Kristine Mcguire annual,  No skins cancers   Diet: the importance of a healthy diet is discussed. They do have a healthy diet.  The benefits of regular aerobic exercise were discussed. She walks 4 times per week ,  20 minutes.   Depression screen: there are no signs or vegative symptoms of depression- irritability, change in appetite, anhedonia, sadness/tearfullness.  The following portions of the patient's history were reviewed and updated as appropriate: allergies, current medications, past family history, past medical history,  past surgical history, past social history  and problem list.  Visual acuity was not assessed per patient preference since she has regular follow up with her ophthalmologist. Hearing and body mass index were assessed and reviewed.   During the course of the visit the patient was educated and counseled about appropriate screening and  preventive services including : fall prevention , diabetes screening, nutrition counseling, colorectal cancer screening, and recommended immunizations.    CC: The primary encounter diagnosis was Screening for breast cancer. Diagnoses of Vitamin D deficiency, Fatigue, unspecified type, Hyperlipidemia LDL goal <160, Generalized anxiety disorder, Hyperlipidemia with target LDL less than 160, Encounter for preventive health examination, and Migraine with aura and without status migrainosus, not intractable were also pertinent to this visit.   1) increased anxiety due to husband's short term disability due to new onset anxiety aggravated by failure to transition to another aircraft as a Programme researcher, broadcasting/film/video . Husband is currently medicated and cannot fly for 6 months.  Patient is no longer seeing dr. Nicolasa Mcguire.   patient has had recurrence of ocular migraines,  Which have improved in frequency since she increased her  celexa to 20 mg daily.  She is  spending more time with husband as a result of his short term disability.  Patient continues to work part time.  . Using 400 mg ibuprofen at onset of aura.    Taking RYR twice daily,  Gummy bears for her calcium/ vit D3  History Kristine Mcguire has a past medical history of History of chicken pox; Migraine; and UTI (urinary tract infection).   She has a past surgical history that includes Cesarean section (1992) and Debridement tennis elbow (208).   Her family history includes Alzheimer's disease in her paternal grandmother; Breast cancer in her maternal aunt; Cancer in her father; Heart attack (age of onset: 32) in her  maternal uncle; Heart attack (age of onset: 15) in her maternal grandfather; Heart attack (age of onset: 68) in her maternal grandmother; Hyperlipidemia in her mother; Hypertension (age of onset: 24) in her mother; Pulmonary fibrosis in her maternal grandmother and paternal uncle; Pulmonary fibrosis (age of onset: 56) in her brother.She reports that she has  never smoked. She has never used smokeless tobacco. She reports that she drinks alcohol. She reports that she does not use drugs.  Outpatient Medications Prior to Visit  Medication Sig Dispense Refill  . Cholecalciferol (VITAMIN D3) 1000 UNITS CHEW Chew 2 tablets by mouth daily.     . Red Yeast Rice 600 MG CAPS Take 1 capsule by mouth 2 (two) times daily.    . citalopram (CELEXA) 20 MG tablet Take 1 tablet (20 mg total) by mouth daily. 30 tablet 2  . citalopram (CELEXA) 10 MG tablet TAKE 1 TABLET BY MOUTH EVERY DAY 30 tablet 2  . conjugated estrogens (PREMARIN) vaginal cream Place 1 Applicatorful vaginally as needed. (Patient not taking: Reported on 07/15/2016) 42.5 g 3  . neomycin-polymyxin-hydrocortisone (CORTISPORIN) otic solution Place 4 drops into the left ear 4 (four) times daily. For 7 days 10 mL 0   No facility-administered medications prior to visit.     Review of Systems   Patient denies , fevers, malaise, unintentional weight loss, skin rash, eye pain, sinus congestion and sinus pain, sore throat, dysphagia,  hemoptysis , cough, dyspnea, wheezing, chest pain, palpitations, orthopnea, edema, abdominal pain, nausea, melena, diarrhea, constipation, flank pain, dysuria, hematuria, urinary  Frequency, nocturia, numbness, tingling, seizures,  Focal weakness, Loss of consciousness,  Tremor, insomnia, depression,, and suicidal ideation.     Objective:  BP 122/70   Pulse (!) 57   Temp 98.7 F (37.1 C) (Oral)   Resp 12   Ht 5' 5.75" (1.67 m)   Wt 140 lb 8 oz (63.7 kg)   SpO2 98%   BMI 22.85 kg/m   Physical Exam   General appearance: alert, cooperative and appears stated age Head: Normocephalic, without obvious abnormality, atraumatic Eyes: conjunctivae/corneas clear. PERRL, EOM's intact. Fundi benign. Ears: normal TM's and external ear canals both ears Nose: Nares normal. Septum midline. Mucosa normal. No drainage or sinus tenderness. Throat: lips, mucosa, and tongue normal;  teeth and gums normal Neck: no adenopathy, no carotid bruit, no JVD, supple, symmetrical, trachea midline and thyroid not enlarged, symmetric, no tenderness/mass/nodules Lungs: clear to auscultation bilaterally Breasts: normal appearance, no masses or tenderness Heart: regular rate and rhythm, S1, S2 normal, no murmur, click, rub or gallop Abdomen: soft, non-tender; bowel sounds normal; no masses,  no organomegaly Extremities: extremities normal, atraumatic, no cyanosis or edema Pulses: 2+ and symmetric Skin: Skin color, texture, turgor normal. No rashes or lesions Neurologic: Alert and oriented X 3, normal strength and tone. Normal symmetric reflexes. Normal coordination and gait.      Assessment & Plan:   Problem List Items Addressed This Visit    Encounter for preventive health examination    Annual comprehensive preventive exam was done as well as an evaluation and management of chronic conditions .  During the course of the visit the patient was educated and counseled about appropriate screening and preventive services including :  diabetes screening, lipid analysis with projected  10 year  risk for CAD , nutrition counseling, breast, cervical and colorectal cancer screening, and recommended immunizations.  Printed recommendations for health maintenance screenings was given      Generalized anxiety disorder  improved with citalopram increase to 20 mg.  Migraine frequency has improved.A total of 40 minutes of face to face time was spent with patient more than half of which was spent in counselling about her current and past treatment for anxiety,  She does not feel that she needs to return to Dr. Nicolasa Mcguire.       Hyperlipidemia with target LDL less than 160    Managed with red yeast rice.  Current 10 yr risk is 6.78%   No changes today   Lab Results  Component Value Date   CHOL 270 (H) 07/15/2016   HDL 60.00 07/15/2016   LDLCALC 183 (H) 07/15/2016   LDLDIRECT 174.1 04/15/2013    TRIG 134.0 07/15/2016   CHOLHDL 4 07/15/2016         Migraine    Ocular, Occurring 3 times weekly,  Aborted with advil.  Improving frequency with higher dose of lexapro.       Relevant Medications   citalopram (CELEXA) 20 MG tablet   Vitamin D deficiency    Continue current supplementation ,  Level is 48       Relevant Orders   VITAMIN D 25 Hydroxy (Vit-D Deficiency, Fractures) (Completed)    Other Visit Diagnoses    Screening for breast cancer    -  Primary   Relevant Orders   MM SCREENING BREAST TOMO BILATERAL   Fatigue, unspecified type       Relevant Orders   Comprehensive metabolic panel (Completed)   TSH (Completed)   CBC with Differential/Platelet (Completed)   HIV antibody (Completed)   Hyperlipidemia LDL goal <160       Relevant Orders   Lipid panel (Completed)      I have discontinued Ms. Arocha conjugated estrogens and neomycin-polymyxin-hydrocortisone. I am also having her maintain her Red Yeast Rice, Vitamin D3, and citalopram.  Meds ordered this encounter  Medications  . citalopram (CELEXA) 20 MG tablet    Sig: Take 1 tablet (20 mg total) by mouth daily.    Dispense:  30 tablet    Refill:  5    Medications Discontinued During This Encounter  Medication Reason  . citalopram (CELEXA) 10 MG tablet Change in therapy  . conjugated estrogens (PREMARIN) vaginal cream Completed Course  . neomycin-polymyxin-hydrocortisone (CORTISPORIN) otic solution Completed Course  . citalopram (CELEXA) 20 MG tablet Reorder    Follow-up: Return in about 6 months (around 01/13/2017), or follow up on anxiety .   [name], MD

## 2016-07-15 NOTE — Patient Instructions (Signed)

## 2016-07-16 ENCOUNTER — Encounter: Payer: Self-pay | Admitting: Internal Medicine

## 2016-07-16 LAB — HIV ANTIBODY (ROUTINE TESTING W REFLEX): HIV 1&2 Ab, 4th Generation: NONREACTIVE

## 2016-07-16 NOTE — Assessment & Plan Note (Addendum)
Annual comprehensive preventive exam was done as well as an evaluation and management of chronic conditions .  During the course of the visit the patient was educated and counseled about appropriate screening and preventive services including :  diabetes screening, lipid analysis with projected  10 year  risk for CAD , nutrition counseling, breast, cervical and colorectal cancer screening, and recommended immunizations.  Printed recommendations for health maintenance screenings was given 

## 2016-07-16 NOTE — Assessment & Plan Note (Signed)
improved with citalopram increase to 20 mg.  Migraine frequency has improved.A total of 40 minutes of face to face time was spent with patient more than half of which was spent in counselling about her current and past treatment for anxiety,  She does not feel that she needs to return to Dr. Nicolasa Ducking.

## 2016-07-16 NOTE — Assessment & Plan Note (Signed)
Continue current supplementation ,  Level is 48

## 2016-07-16 NOTE — Assessment & Plan Note (Signed)
Ocular, Occurring 3 times weekly,  Aborted with advil.  Improving frequency with higher dose of lexapro.

## 2016-07-16 NOTE — Assessment & Plan Note (Signed)
Managed with red yeast rice.  Current 10 yr risk is 6.78%   No changes today   Lab Results  Component Value Date   CHOL 270 (H) 07/15/2016   HDL 60.00 07/15/2016   LDLCALC 183 (H) 07/15/2016   LDLDIRECT 174.1 04/15/2013   TRIG 134.0 07/15/2016   CHOLHDL 4 07/15/2016

## 2016-07-17 ENCOUNTER — Other Ambulatory Visit: Payer: Self-pay | Admitting: Internal Medicine

## 2016-07-19 ENCOUNTER — Encounter: Payer: Self-pay | Admitting: Internal Medicine

## 2016-08-23 ENCOUNTER — Encounter: Payer: Self-pay | Admitting: Internal Medicine

## 2016-09-13 ENCOUNTER — Ambulatory Visit
Admission: RE | Admit: 2016-09-13 | Discharge: 2016-09-13 | Disposition: A | Discharge status: Home / Self Care | Payer: BLUE CROSS/BLUE SHIELD | Source: Ambulatory Visit | Attending: Internal Medicine | Admitting: Internal Medicine

## 2016-09-13 DIAGNOSIS — Z1231 Encounter for screening mammogram for malignant neoplasm of breast: Secondary | ICD-10-CM | POA: Diagnosis not present

## 2016-09-13 DIAGNOSIS — Z1239 Encounter for other screening for malignant neoplasm of breast: Secondary | ICD-10-CM

## 2017-01-13 ENCOUNTER — Ambulatory Visit (INDEPENDENT_AMBULATORY_CARE_PROVIDER_SITE_OTHER): Payer: BLUE CROSS/BLUE SHIELD | Admitting: Internal Medicine

## 2017-01-13 ENCOUNTER — Encounter: Payer: Self-pay | Admitting: Internal Medicine

## 2017-01-13 VITALS — BP 100/68 | HR 58 | Temp 97.8°F | Resp 14 | Ht 65.75 in | Wt 147.4 lb

## 2017-01-13 DIAGNOSIS — R635 Abnormal weight gain: Secondary | ICD-10-CM | POA: Diagnosis not present

## 2017-01-13 DIAGNOSIS — Z79899 Other long term (current) drug therapy: Secondary | ICD-10-CM

## 2017-01-13 DIAGNOSIS — F411 Generalized anxiety disorder: Secondary | ICD-10-CM | POA: Diagnosis not present

## 2017-01-13 DIAGNOSIS — E785 Hyperlipidemia, unspecified: Secondary | ICD-10-CM

## 2017-01-13 DIAGNOSIS — T50905A Adverse effect of unspecified drugs, medicaments and biological substances, initial encounter: Secondary | ICD-10-CM

## 2017-01-13 LAB — COMPREHENSIVE METABOLIC PANEL
ALT: 21 U/L (ref 0–35)
AST: 21 U/L (ref 0–37)
Albumin: 4.5 g/dL (ref 3.5–5.2)
Alkaline Phosphatase: 89 U/L (ref 39–117)
BUN: 17 mg/dL (ref 6–23)
CO2: 28 mEq/L (ref 19–32)
Calcium: 9.6 mg/dL (ref 8.4–10.5)
Chloride: 103 mEq/L (ref 96–112)
Creatinine, Ser: 0.83 mg/dL (ref 0.40–1.20)
GFR: 75.3 mL/min (ref >60.00)
Glucose, Bld: 102 mg/dL — ABNORMAL HIGH (ref 70–99)
Potassium: 3.9 mEq/L (ref 3.5–5.1)
Sodium: 138 mEq/L (ref 135–145)
Total Bilirubin: 0.5 mg/dL (ref 0.2–1.2)
Total Protein: 7 g/dL (ref 6.0–8.3)

## 2017-01-13 MED ORDER — CITALOPRAM HYDROBROMIDE 10 MG PO TABS: 10.0000 mg | ORAL_TABLET | Freq: Every day | ORAL | 1 refills | Status: DC

## 2017-01-13 NOTE — Patient Instructions (Addendum)
   I recommend continuing citalopram at 10 mg for long term control of migraines/anxietyt   You look great!  Your weight is fine.  You might want to try add a premixed protein drink called Premier Protein  In vanilla  to your smoothie  To boost the  protein  . It is great tasting,   very low sugar (1 g sugar) ( and available of < $2 serving at [facility] and  In bulk for $1.50/serving at [employer] and [employer]  .    Nutritional analysis :  160 cal  30 g protein  1 g sugar 50% calcium needs   [employer] and [employer]  I'll see you in 6 months

## 2017-01-13 NOTE — Assessment & Plan Note (Addendum)
Improved with husband's improvement in anxiety.  She has recurrent symptoms which recur when medication is temporarily stopped.  Continue 10 mg citalopram both for mood and migraine prevention. A total of 25 minutes of face to face time was spent with patient more than half of which was spent in counselling about the above mentioned conditions  and coordination of care

## 2017-01-13 NOTE — Assessment & Plan Note (Signed)
Managed with red yeast rice 600 mg bid  No changes today lft's pending

## 2017-01-13 NOTE — Assessment & Plan Note (Signed)
Reassured that her weight is still normal range.  And she is exercising regularly.  Diet reviewed in detail as well.  No significant problems.

## 2017-01-13 NOTE — Progress Notes (Signed)
Subjective:  Patient ID: Kristine Mcguire, female    DOB: 1961-05-10  Age: 56 y.o. MRN: [id number]  CC: The primary encounter diagnosis was Long-term use of high-risk medication. Diagnoses of Generalized anxiety disorder, Hyperlipidemia with target LDL less than 160, and Weight gain due to medication were also pertinent to this visit.  HPI Kristine Mcguire presents for follow up on GAD and chronic migraines with aura, managed with citalopram .  She feels generally well but is frustrated by her weight gain.  She has a history of unintentional weight loss when her anxiety was uncontrolled, and dropped to 122 lbs  in 2015. Then "gained it all back" once she started citalopram,  And continued to gain over the holiday s  Has been working out regularly for the past year .  Feels like she is never full. Goal is 134 lbs.  Cardio and weight s 3 days a week,  Aerobics or yoga 2 days per week.   Has reduced dose  Of citalopram from  30 to 20and more recently  to 10 mg  Because "things have settled down."  In April . Husband is doing better (has been in a "no fly" work period for over 6 months.  Prior attempts to stop have caused return of migraine with aura       Outpatient Medications Prior to Visit  Medication Sig Dispense Refill  . Cholecalciferol (VITAMIN D3) 1000 UNITS CHEW Chew 2 tablets by mouth daily.     . Red Yeast Rice 600 MG CAPS Take 1 capsule by mouth 2 (two) times daily.    . citalopram (CELEXA) 20 MG tablet Take 1 tablet (20 mg total) by mouth daily. 30 tablet 5   No facility-administered medications prior to visit.     Review of Systems;  Patient denies headache, fevers, malaise, unintentional weight loss, skin rash, eye pain, sinus congestion and sinus pain, sore throat, dysphagia,  hemoptysis , cough, dyspnea, wheezing, chest pain, palpitations, orthopnea, edema, abdominal pain, nausea, melena, diarrhea, constipation, flank pain, dysuria, hematuria, urinary  Frequency,  nocturia, numbness, tingling, seizures,  Focal weakness, Loss of consciousness,  Tremor, insomnia, depression, anxiety, and suicidal ideation.      Objective:  BP 100/68 (BP Location: Left Arm, Patient Position: Sitting, Cuff Size: Normal)   Pulse (!) 58   Temp 97.8 F (36.6 C) (Oral)   Resp 14   Ht 5' 5.75" (1.67 m)   Wt 147 lb 6.4 oz (66.9 kg)   SpO2 96%   BMI 23.97 kg/m   BP Readings from Last 3 Encounters:  01/13/17 100/68  07/15/16 122/70  03/15/16 112/64    Wt Readings from Last 3 Encounters:  01/13/17 147 lb 6.4 oz (66.9 kg)  07/15/16 140 lb 8 oz (63.7 kg)  03/15/16 138 lb (62.6 kg)    General appearance: alert, cooperative and appears stated age Ears: normal TM's and external ear canals both ears Throat: lips, mucosa, and tongue normal; teeth and gums normal Neck: no adenopathy, no carotid bruit, supple, symmetrical, trachea midline and thyroid not enlarged, symmetric, no tenderness/mass/nodules Back: symmetric, no curvature. ROM normal. No CVA tenderness. Lungs: clear to auscultation bilaterally Heart: regular rate and rhythm, S1, S2 normal, no murmur, click, rub or gallop Abdomen: soft, non-tender; bowel sounds normal; no masses,  no organomegaly Pulses: 2+ and symmetric Skin: Skin color, texture, turgor normal. No rashes or lesions Lymph nodes: Cervical, supraclavicular, and axillary nodes normal. Psych: affect normal, makes good eye contact. No fidgeting,  Smiles easily.  Denies suicidal thoughts   No results found for: HGBA1C  Lab Results  Component Value Date   CREATININE 0.80 07/15/2016   CREATININE 0.72 07/03/2015   CREATININE 0.8 06/27/2014    Lab Results  Component Value Date   WBC 4.6 07/15/2016   HGB 13.9 07/15/2016   HCT 41.5 07/15/2016   PLT 329.0 07/15/2016   GLUCOSE 101 (H) 07/15/2016   CHOL 270 (H) 07/15/2016   TRIG 134.0 07/15/2016   HDL 60.00 07/15/2016   LDLDIRECT 174.1 04/15/2013   LDLCALC 183 (H) 07/15/2016   ALT 19  07/15/2016   AST 19 07/15/2016   NA 140 07/15/2016   K 4.4 07/15/2016   CL 103 07/15/2016   CREATININE 0.80 07/15/2016   BUN 14 07/15/2016   CO2 30 07/15/2016   TSH 1.01 07/15/2016    Mm Screening Breast Tomo Bilateral  Result Date: 09/16/2016 CLINICAL DATA:  Screening. EXAM: 2D DIGITAL SCREENING BILATERAL MAMMOGRAM WITH CAD AND ADJUNCT TOMO COMPARISON:  Previous exam(s). ACR Breast Density Category c: The breast tissue is heterogeneously dense, which may obscure small masses. FINDINGS: There are no findings suspicious for malignancy. Images were processed with CAD. IMPRESSION: No mammographic evidence of malignancy. A result letter of this screening mammogram will be mailed directly to the patient. RECOMMENDATION: Screening mammogram in one year. (Code:SM-B-01Y) BI-RADS CATEGORY  1: Negative. Electronically Signed   By: Ammie Ferrier M.D.   On: 09/16/2016 09:25    Assessment & Plan:   Problem List Items Addressed This Visit    Weight gain due to medication    Reassured that her weight is still normal range.  And she is exercising regularly.  Diet reviewed in detail as well.  No significant problems.       Hyperlipidemia with target LDL less than 160    Managed with red yeast rice 600 mg bid  No changes today lft's pending       Generalized anxiety disorder    Improved with husband's improvement in anxiety.  She has recurrent symptoms which recur when medication is temporarily stopped.  Continue 10 mg citalopram both for mood and migraine prevention. A total of 25 minutes of face to face time was spent with patient more than half of which was spent in counselling about the above mentioned conditions  and coordination of care        Other Visit Diagnoses    Long-term use of high-risk medication    -  Primary   Relevant Orders   Comprehensive metabolic panel     A total of 25 minutes of face to face time was spent with patient more than half of which was spent in counselling  about the above mentioned conditions  and coordination of care  I have changed Ms. Rule citalopram. I am also having her maintain her Red Yeast Rice and Vitamin D3.  Meds ordered this encounter  Medications  . citalopram (CELEXA) 10 MG tablet    Sig: Take 1 tablet (10 mg total) by mouth daily.    Dispense:  90 tablet    Refill:  1    Medications Discontinued During This Encounter  Medication Reason  . citalopram (CELEXA) 20 MG tablet Reorder    Follow-up: Return in about 6 months (around 07/15/2017) for CPE  ,  fasting labs prior .   [name], MD

## 2017-01-15 ENCOUNTER — Encounter: Payer: Self-pay | Admitting: Internal Medicine

## 2017-01-20 ENCOUNTER — Other Ambulatory Visit: Payer: Self-pay | Admitting: Internal Medicine

## 2017-07-09 ENCOUNTER — Telehealth: Payer: Self-pay | Admitting: Radiology

## 2017-07-09 ENCOUNTER — Other Ambulatory Visit: Payer: Self-pay | Admitting: Internal Medicine

## 2017-07-09 DIAGNOSIS — E785 Hyperlipidemia, unspecified: Secondary | ICD-10-CM

## 2017-07-09 NOTE — Telephone Encounter (Signed)
cmet and lipid ordered

## 2017-07-09 NOTE — Telephone Encounter (Signed)
Pt coming in for labs tomorrow, please place future orders. Thank you.  

## 2017-07-10 ENCOUNTER — Other Ambulatory Visit (INDEPENDENT_AMBULATORY_CARE_PROVIDER_SITE_OTHER): Payer: BLUE CROSS/BLUE SHIELD

## 2017-07-10 DIAGNOSIS — E785 Hyperlipidemia, unspecified: Secondary | ICD-10-CM

## 2017-07-10 LAB — COMPREHENSIVE METABOLIC PANEL
ALT: 19 U/L (ref 0–35)
AST: 19 U/L (ref 0–37)
Albumin: 4.3 g/dL (ref 3.5–5.2)
Alkaline Phosphatase: 88 U/L (ref 39–117)
BUN: 18 mg/dL (ref 6–23)
CO2: 30 mEq/L (ref 19–32)
Calcium: 9.7 mg/dL (ref 8.4–10.5)
Chloride: 103 mEq/L (ref 96–112)
Creatinine, Ser: 0.82 mg/dL (ref 0.40–1.20)
GFR: 76.23 mL/min (ref >60.00)
Glucose, Bld: 105 mg/dL — ABNORMAL HIGH (ref 70–99)
Potassium: 3.9 mEq/L (ref 3.5–5.1)
Sodium: 139 mEq/L (ref 135–145)
Total Bilirubin: 0.7 mg/dL (ref 0.2–1.2)
Total Protein: 7 g/dL (ref 6.0–8.3)

## 2017-07-10 LAB — LIPID PANEL
Cholesterol: 283 mg/dL — ABNORMAL HIGH (ref 0–200)
HDL: 59.4 mg/dL (ref >39.00)
LDL Cholesterol: 203 mg/dL — ABNORMAL HIGH (ref 0–99)
NonHDL: 223.34
Total CHOL/HDL Ratio: 5
Triglycerides: 101 mg/dL (ref 0.0–149.0)
VLDL: 20.2 mg/dL (ref 0.0–40.0)

## 2017-07-13 ENCOUNTER — Encounter: Payer: Self-pay | Admitting: Internal Medicine

## 2017-07-16 ENCOUNTER — Encounter: Payer: BLUE CROSS/BLUE SHIELD | Admitting: Internal Medicine

## 2017-07-24 ENCOUNTER — Encounter: Payer: Self-pay | Admitting: Internal Medicine

## 2017-07-24 ENCOUNTER — Ambulatory Visit (INDEPENDENT_AMBULATORY_CARE_PROVIDER_SITE_OTHER): Payer: BLUE CROSS/BLUE SHIELD | Admitting: Internal Medicine

## 2017-07-24 VITALS — BP 120/74 | HR 65 | Temp 98.5°F | Resp 14 | Ht 65.75 in | Wt 150.2 lb

## 2017-07-24 DIAGNOSIS — Z0000 Encounter for general adult medical examination without abnormal findings: Secondary | ICD-10-CM

## 2017-07-24 DIAGNOSIS — Z1231 Encounter for screening mammogram for malignant neoplasm of breast: Secondary | ICD-10-CM | POA: Diagnosis not present

## 2017-07-24 DIAGNOSIS — E785 Hyperlipidemia, unspecified: Secondary | ICD-10-CM

## 2017-07-24 DIAGNOSIS — Z1239 Encounter for other screening for malignant neoplasm of breast: Secondary | ICD-10-CM

## 2017-07-24 DIAGNOSIS — R7301 Impaired fasting glucose: Secondary | ICD-10-CM | POA: Diagnosis not present

## 2017-07-24 LAB — POCT GLYCOSYLATED HEMOGLOBIN (HGB A1C): Hemoglobin A1C: 5.3

## 2017-07-24 MED ORDER — ZOSTER VAC RECOMB ADJUVANTED 50 MCG/0.5ML IM SUSR: 0.5000 mL | Freq: Once | INTRAMUSCULAR | 1 refills | Status: AC

## 2017-07-24 NOTE — Patient Instructions (Signed)
I have ordered your mammogram.   Your next PAP smear is due in Dec 2019  Your next screening colonoscopy is due in 2021 (when you are 56)   If you decide you want to resume vaginal estrogen,  Let me know   The ShingRx vaccine is now available in local pharmacies and is much more protective thant Zostavaxs,  It is therefore ADVISED for all interested adults over 50 to prevent shingles , and I have given you an rx for it.    Your  fasting glucose has never been  diagnostic of diabetes; but your A1c will let us know if  you are at risk for developing type 2 Diabetes.  Continuing the excellent lifestyle changes that you have made should delay the progression to diabetes for a long time.     I'll see you  again  In 6 months    Health Maintenance for Postmenopausal Women Menopause is a normal process in which your reproductive ability comes to an end. This process happens gradually over a span of months to years, usually between the ages of 39 and 71. Menopause is complete when you have missed 12 consecutive menstrual periods. It is important to talk with your health care provider about some of the most common conditions that affect postmenopausal women, such as heart disease, cancer, and bone loss (osteoporosis). Adopting a healthy lifestyle and getting preventive care can help to promote your health and wellness. Those actions can also lower your chances of developing some of these common conditions. What should I know about menopause? During menopause, you may experience a number of symptoms, such as:  Moderate-to-severe hot flashes.  Night sweats.  Decrease in sex drive.  Mood swings.  Headaches.  Tiredness.  Irritability.  Memory problems.  Insomnia.  Choosing to treat or not to treat menopausal changes is an individual decision that you make with your health care provider. What should I know about hormone replacement therapy and supplements? Hormone therapy products are  effective for treating symptoms that are associated with menopause, such as hot flashes and night sweats. Hormone replacement carries certain risks, especially as you become older. If you are thinking about using estrogen or estrogen with progestin treatments, discuss the benefits and risks with your health care provider. What should I know about heart disease and stroke? Heart disease, heart attack, and stroke become more likely as you age. This may be due, in part, to the hormonal changes that your body experiences during menopause. These can affect how your body processes dietary fats, triglycerides, and cholesterol. Heart attack and stroke are both medical emergencies. There are many things that you can do to help prevent heart disease and stroke:  Have your blood pressure checked at least every 1-2 years. High blood pressure causes heart disease and increases the risk of stroke.  If you are 78-57 years old, ask your health care provider if you should take aspirin to prevent a heart attack or a stroke.  Do not use any tobacco products, including cigarettes, chewing tobacco, or electronic cigarettes. If you need help quitting, ask your health care provider.  It is important to eat a healthy diet and maintain a healthy weight. ? Be sure to include plenty of vegetables, fruits, low-fat dairy products, and lean protein. ? Avoid eating foods that are high in solid fats, added sugars, or salt (sodium).  Get regular exercise. This is one of the most important things that you can do for your health. ? Try  to exercise for at least 150 minutes each week. The type of exercise that you do should increase your heart rate and make you sweat. This is known as moderate-intensity exercise. ? Try to do strengthening exercises at least twice each week. Do these in addition to the moderate-intensity exercise.  Know your numbers.Ask your health care provider to check your cholesterol and your blood glucose.  Continue to have your blood tested as directed by your health care provider.  What should I know about cancer screening? There are several types of cancer. Take the following steps to reduce your risk and to catch any cancer development as early as possible. Breast Cancer  Practice breast self-awareness. ? This means understanding how your breasts normally appear and feel. ? It also means doing regular breast self-exams. Let your health care provider know about any changes, no matter how small.  If you are 57 or older, have a clinician do a breast exam (clinical breast exam or CBE) every year. Depending on your age, family history, and medical history, it may be recommended that you also have a yearly breast X-ray (mammogram).  If you have a family history of breast cancer, talk with your health care provider about genetic screening.  If you are at high risk for breast cancer, talk with your health care provider about having an MRI and a mammogram every year.  Breast cancer (BRCA) gene test is recommended for women who have family members with BRCA-related cancers. Results of the assessment will determine the need for genetic counseling and BRCA1 and for BRCA2 testing. BRCA-related cancers include these types: ? Breast. This occurs in males or females. ? Ovarian. ? Tubal. This may also be called fallopian tube cancer. ? Cancer of the abdominal or pelvic lining (peritoneal cancer). ? Prostate. ? Pancreatic.  Cervical, Uterine, and Ovarian Cancer Your health care provider may recommend that you be screened regularly for cancer of the pelvic organs. These include your ovaries, uterus, and vagina. This screening involves a pelvic exam, which includes checking for microscopic changes to the surface of your cervix (Pap test).  For women ages 21-65, health care providers may recommend a pelvic exam and a Pap test every three years. For women ages 67-65, they may recommend the Pap test and pelvic  exam, combined with testing for human papilloma virus (HPV), every five years. Some types of HPV increase your risk of cervical cancer. Testing for HPV may also be done on women of any age who have unclear Pap test results.  Other health care providers may not recommend any screening for nonpregnant women who are considered low risk for pelvic cancer and have no symptoms. Ask your health care provider if a screening pelvic exam is right for you.  If you have had past treatment for cervical cancer or a condition that could lead to cancer, you need Pap tests and screening for cancer for at least 20 years after your treatment. If Pap tests have been discontinued for you, your risk factors (such as having a new sexual partner) need to be reassessed to determine if you should start having screenings again. Some women have medical problems that increase the chance of getting cervical cancer. In these cases, your health care provider may recommend that you have screening and Pap tests more often.  If you have a family history of uterine cancer or ovarian cancer, talk with your health care provider about genetic screening.  If you have vaginal bleeding after reaching menopause, tell  your health care provider.  There are currently no reliable tests available to screen for ovarian cancer.  Lung Cancer Lung cancer screening is recommended for adults 5-39 years old who are at high risk for lung cancer because of a history of smoking. A yearly low-dose CT scan of the lungs is recommended if you:  Currently smoke.  Have a history of at least 30 pack-years of smoking and you currently smoke or have quit within the past 15 years. A pack-year is smoking an average of one pack of cigarettes per day for one year.  Yearly screening should:  Continue until it has been 15 years since you quit.  Stop if you develop a health problem that would prevent you from having lung cancer treatment.  Colorectal  Cancer  This type of cancer can be detected and can often be prevented.  Routine colorectal cancer screening usually begins at age 21 and continues through age 76.  If you have risk factors for colon cancer, your health care provider may recommend that you be screened at an earlier age.  If you have a family history of colorectal cancer, talk with your health care provider about genetic screening.  Your health care provider may also recommend using home test kits to check for hidden blood in your stool.  A small camera at the end of a tube can be used to examine your colon directly (sigmoidoscopy or colonoscopy). This is done to check for the earliest forms of colorectal cancer.  Direct examination of the colon should be repeated every 5-10 years until age 60. However, if early forms of precancerous polyps or small growths are found or if you have a family history or genetic risk for colorectal cancer, you may need to be screened more often.  Skin Cancer  Check your skin from head to toe regularly.  Monitor any moles. Be sure to tell your health care provider: ? About any new moles or changes in moles, especially if there is a change in a mole's shape or color. ? If you have a mole that is larger than the size of a pencil eraser.  If any of your family members has a history of skin cancer, especially at a young age, talk with your health care provider about genetic screening.  Always use sunscreen. Apply sunscreen liberally and repeatedly throughout the day.  Whenever you are outside, protect yourself by wearing long sleeves, pants, a wide-brimmed hat, and sunglasses.  What should I know about osteoporosis? Osteoporosis is a condition in which bone destruction happens more quickly than new bone creation. After menopause, you may be at an increased risk for osteoporosis. To help prevent osteoporosis or the bone fractures that can happen because of osteoporosis, the following is  recommended:  If you are 30-68 years old, get at least 1,000 mg of calcium and at least 600 mg of vitamin D per day.  If you are older than age 8 but younger than age 34, get at least 1,200 mg of calcium and at least 600 mg of vitamin D per day.  If you are older than age 34, get at least 1,200 mg of calcium and at least 800 mg of vitamin D per day.  Smoking and excessive alcohol intake increase the risk of osteoporosis. Eat foods that are rich in calcium and vitamin D, and do weight-bearing exercises several times each week as directed by your health care provider. What should I know about how menopause affects my mental health?  Depression may occur at any age, but it is more common as you become older. Common symptoms of depression include:  Low or sad mood.  Changes in sleep patterns.  Changes in appetite or eating patterns.  Feeling an overall lack of motivation or enjoyment of activities that you previously enjoyed.  Frequent crying spells.  Talk with your health care provider if you think that you are experiencing depression. What should I know about immunizations? It is important that you get and maintain your immunizations. These include:  Tetanus, diphtheria, and pertussis (Tdap) booster vaccine.  Influenza every year before the flu season begins.  Pneumonia vaccine.  Shingles vaccine.  Your health care provider may also recommend other immunizations. This information is not intended to replace advice given to you by your health care provider. Make sure you discuss any questions you have with your health care provider. Document Released: 09/20/2005 Document Revised: 02/16/2016 Document Reviewed: 05/02/2015 Elsevier Interactive Patient Education  2018 [employer].

## 2017-07-24 NOTE — Progress Notes (Signed)
Patient ID: Kristine Mcguire, female    DOB: 1960-12-09  Age: 56 y.o. MRN: [id number]  The patient is here for annual preventive  examination and management of other chronic and acute problems.   PAP SMEAR NORMAL 2016 COLONOSCOPY 2013 MAMMOGRAM FEB 2018 NORMAL   The risk factors are reflected in the social history.  The roster of all physicians providing medical care to patient - is listed in the Snapshot section of the chart.   Home safety : The patient has smoke detectors in the home. They wear seatbelts.  There are no firearms at home. There is no violence in the home.   There is no risks for hepatitis, STDs or HIV. There is no   history of blood transfusion. They have no travel history to infectious disease endemic areas of the world.  The patient has seen their dentist in the last six month. They have seen their eye doctor in the last year. They do not  have excessive sun exposure. Discussed the need for sun protection: hats, long sleeves and use of sunscreen if there is significant sun exposure.   Diet: the importance of a healthy diet is discussed. They do have a healthy diet.  The benefits of regular aerobic exercise were discussed. She works out 3 times per week.    Depression screen: there are no signs or vegative symptoms of depression- irritability, change in appetite, anhedonia, sadness/tearfullness.  The following portions of the patient's history were reviewed and updated as appropriate: allergies, current medications, past family history, past medical history,  past surgical history, past social history  and problem list.  Visual acuity was not assessed per patient preference since she has regular follow up with her ophthalmologist. Hearing and body mass index were assessed and reviewed.   During the course of the visit the patient was educated and counseled about appropriate screening and preventive services including : fall prevention , diabetes screening, nutrition  counseling, colorectal cancer screening, and recommended immunizations.    CC: The primary encounter diagnosis was Screening for breast cancer. Diagnoses of Impaired fasting glucose, Encounter for preventive health examination, and Hyperlipidemia with target LDL less than 160 were also pertinent to this visit.  History Kristine Mcguire has a past medical history of History of chicken pox, Migraine, and UTI (urinary tract infection).   She has a past surgical history that includes Cesarean section (1992) and Debridement tennis elbow (208).   Her family history includes Alzheimer's disease in her paternal grandmother; Breast cancer in her maternal aunt; Cancer in her father; Heart attack (age of onset: 57) in her maternal uncle; Heart attack (age of onset: 31) in her maternal grandfather; Heart attack (age of onset: 15) in her maternal grandmother; Hyperlipidemia in her mother; Hypertension (age of onset: 36) in her mother; Pulmonary fibrosis in her maternal grandmother and paternal uncle; Pulmonary fibrosis (age of onset: 58) in her brother.She reports that  has never smoked. she has never used smokeless tobacco. She reports that she drinks alcohol. She reports that she does not use drugs.  Outpatient Medications Prior to Visit  Medication Sig Dispense Refill  . Cholecalciferol (VITAMIN D3) 1000 UNITS CHEW Chew 2 tablets by mouth daily.     . citalopram (CELEXA) 10 MG tablet TAKE 1 TABLET BY MOUTH EVERY DAY 90 tablet 0  . Red Yeast Rice 600 MG CAPS Take 1 capsule by mouth 2 (two) times daily.     No facility-administered medications prior to visit.     Review  of Systems   Patient denies headache, fevers, malaise, unintentional weight loss, skin rash, eye pain, sinus congestion and sinus pain, sore throat, dysphagia,  hemoptysis , cough, dyspnea, wheezing, chest pain, palpitations, orthopnea, edema, abdominal pain, nausea, melena, diarrhea, constipation, flank pain, dysuria, hematuria, urinary   Frequency, nocturia, numbness, tingling, seizures,  Focal weakness, Loss of consciousness,  Tremor, insomnia, depression, anxiety, and suicidal ideation.      Objective:  BP 120/74 (BP Location: Left Arm, Patient Position: Sitting, Cuff Size: Normal)   Pulse 65   Temp 98.5 F (36.9 C) (Oral)   Resp 14   Ht 5' 5.75" (1.67 m)   Wt 150 lb 3.2 oz (68.1 kg)   SpO2 95%   BMI 24.43 kg/m   Physical Exam   General appearance: alert, cooperative and appears stated age Head: Normocephalic, without obvious abnormality, atraumatic Eyes: conjunctivae/corneas clear. PERRL, EOM's intact. Fundi benign. Ears: normal TM's and external ear canals both ears Nose: Nares normal. Septum midline. Mucosa normal. No drainage or sinus tenderness. Throat: lips, mucosa, and tongue normal; teeth and gums normal Neck: no adenopathy, no carotid bruit, no JVD, supple, symmetrical, trachea midline and thyroid not enlarged, symmetric, no tenderness/mass/nodules Lungs: clear to auscultation bilaterally Breasts: normal appearance, no masses or tenderness Heart: regular rate and rhythm, S1, S2 normal, no murmur, click, rub or gallop Abdomen: soft, non-tender; bowel sounds normal; no masses,  no organomegaly Extremities: extremities normal, atraumatic, no cyanosis or edema Pulses: 2+ and symmetric Skin: Skin color, texture, turgor normal. No rashes or lesions Neurologic: Alert and oriented X 3, normal strength and tone. Normal symmetric reflexes. Normal coordination and gait.      Assessment & Plan:   Problem List Items Addressed This Visit    Encounter for preventive health examination    Annual comprehensive preventive exam was done as well as an evaluation and management of chronic conditions .  During the course of the visit the patient was educated and counseled about appropriate screening and preventive services including :  diabetes screening, lipid analysis with projected  10 year  risk for CAD , nutrition  counseling, breast, cervical and colorectal cancer screening, and recommended immunizations.  Printed recommendations for health maintenance screenings was given      Hyperlipidemia with target LDL less than 160    Managed with red yeast rice 600 mg bid per patient preference.   10 yr risk is 7% using the FRC .  No changes today.   Lab Results  Component Value Date   CHOL 283 (H) 07/10/2017   HDL 59.40 07/10/2017   LDLCALC 203 (H) 07/10/2017   LDLDIRECT 174.1 04/15/2013   TRIG 101.0 07/10/2017   CHOLHDL 5 07/10/2017   Lab Results  Component Value Date   ALT 19 07/10/2017   AST 19 07/10/2017   ALKPHOS 88 07/10/2017   BILITOT 0.7 07/10/2017          Other Visit Diagnoses    Screening for breast cancer    -  Primary   Relevant Orders   MM SCREENING BREAST TOMO BILATERAL   Impaired fasting glucose       Relevant Orders   POCT HgB A1C (Completed)      I am having Kristine Mcguire start on Zoster Vaccine Adjuvanted. I am also having her maintain her Red Yeast Rice, Vitamin D3, and citalopram.  Meds ordered this encounter  Medications  . Zoster Vaccine Adjuvanted [facility]) injection    Sig: Inject 0.5 mLs into the muscle  once for 1 dose.    Dispense:  1 each    Refill:  1    There are no discontinued medications.  Follow-up: Return in about 6 months (around 01/22/2018).   [name], MD

## 2017-07-26 NOTE — Assessment & Plan Note (Signed)
Managed with red yeast rice 600 mg bid per patient preference.   10 yr risk is 7% using the FRC .  No changes today.   Lab Results  Component Value Date   CHOL 283 (H) 07/10/2017   HDL 59.40 07/10/2017   LDLCALC 203 (H) 07/10/2017   LDLDIRECT 174.1 04/15/2013   TRIG 101.0 07/10/2017   CHOLHDL 5 07/10/2017   Lab Results  Component Value Date   ALT 19 07/10/2017   AST 19 07/10/2017   ALKPHOS 88 07/10/2017   BILITOT 0.7 07/10/2017

## 2017-07-26 NOTE — Assessment & Plan Note (Signed)
Annual comprehensive preventive exam was done as well as an evaluation and management of chronic conditions .  During the course of the visit the patient was educated and counseled about appropriate screening and preventive services including :  diabetes screening, lipid analysis with projected  10 year  risk for CAD , nutrition counseling, breast, cervical and colorectal cancer screening, and recommended immunizations.  Printed recommendations for health maintenance screenings was given 

## 2017-08-15 ENCOUNTER — Encounter: Payer: Self-pay | Admitting: Internal Medicine

## 2017-09-17 ENCOUNTER — Ambulatory Visit: Payer: BLUE CROSS/BLUE SHIELD

## 2017-09-25 ENCOUNTER — Ambulatory Visit
Admission: RE | Admit: 2017-09-25 | Discharge: 2017-09-25 | Disposition: A | Discharge status: Home / Self Care | Payer: BLUE CROSS/BLUE SHIELD | Source: Ambulatory Visit | Attending: Internal Medicine | Admitting: Internal Medicine

## 2017-09-25 DIAGNOSIS — Z1231 Encounter for screening mammogram for malignant neoplasm of breast: Secondary | ICD-10-CM | POA: Insufficient documentation

## 2017-09-25 DIAGNOSIS — Z1239 Encounter for other screening for malignant neoplasm of breast: Secondary | ICD-10-CM

## 2017-10-05 ENCOUNTER — Other Ambulatory Visit: Payer: Self-pay | Admitting: Internal Medicine

## 2018-01-22 ENCOUNTER — Ambulatory Visit: Payer: BLUE CROSS/BLUE SHIELD | Admitting: Internal Medicine

## 2018-01-22 ENCOUNTER — Encounter: Payer: Self-pay | Admitting: Internal Medicine

## 2018-01-22 VITALS — BP 110/72 | HR 67 | Temp 98.2°F | Resp 14 | Ht 65.75 in | Wt 154.6 lb

## 2018-01-22 DIAGNOSIS — F411 Generalized anxiety disorder: Secondary | ICD-10-CM | POA: Diagnosis not present

## 2018-01-22 DIAGNOSIS — Z79899 Other long term (current) drug therapy: Secondary | ICD-10-CM

## 2018-01-22 DIAGNOSIS — E785 Hyperlipidemia, unspecified: Secondary | ICD-10-CM

## 2018-01-22 LAB — COMPREHENSIVE METABOLIC PANEL
ALT: 15 U/L (ref 0–35)
AST: 18 U/L (ref 0–37)
Albumin: 4.3 g/dL (ref 3.5–5.2)
Alkaline Phosphatase: 84 U/L (ref 39–117)
BUN: 19 mg/dL (ref 6–23)
CO2: 29 mEq/L (ref 19–32)
Calcium: 9.8 mg/dL (ref 8.4–10.5)
Chloride: 104 mEq/L (ref 96–112)
Creatinine, Ser: 0.84 mg/dL (ref 0.40–1.20)
GFR: 74 mL/min (ref >60.00)
Glucose, Bld: 100 mg/dL — ABNORMAL HIGH (ref 70–99)
Potassium: 4.4 mEq/L (ref 3.5–5.1)
Sodium: 140 mEq/L (ref 135–145)
Total Bilirubin: 0.6 mg/dL (ref 0.2–1.2)
Total Protein: 6.9 g/dL (ref 6.0–8.3)

## 2018-01-22 MED ORDER — CITALOPRAM HYDROBROMIDE 10 MG PO TABS
10.0000 mg | ORAL_TABLET | Freq: Every day | ORAL | 1 refills | Status: DC
Start: 2018-01-22 — End: 2018-09-28

## 2018-01-22 NOTE — Patient Instructions (Signed)
Good to see you!!  I have refilled the celexa and will forward our lab results when available   Return in 6 months for CPE with PAP  If you would like to do fasting labs prior to appt,  You can arrange that with front desk on your way out (after the lab)

## 2018-01-22 NOTE — Progress Notes (Signed)
Subjective:  Patient ID: Kristine Mcguire, female    DOB: [birth year]  Age: 57 y.o. MRN: [id number]  CC: The primary encounter diagnosis was Long-term use of high-risk medication. Diagnoses of Hyperlipidemia with target LDL less than 160 and Generalized anxiety disorder were also pertinent to this visit.  HPI Kristine Mcguire presents for follow up on  Migraine headaches. and hyperlipidemia  Her migraine auras reduced to once  every 3  to 4 months with continued use of low dose celexa    Has been taking Red Yeast Rice   For choelsterol management     Outpatient Medications Prior to Visit  Medication Sig Dispense Refill  . Cholecalciferol (VITAMIN D3) 1000 UNITS CHEW Chew 2 tablets by mouth daily.     . Red Yeast Rice 600 MG CAPS Take 1 capsule by mouth 2 (two) times daily.    . citalopram (CELEXA) 10 MG tablet TAKE 1 TABLET BY MOUTH EVERY DAY 90 tablet 1   No facility-administered medications prior to visit.     Review of Systems;  Patient denies headache, fevers, malaise, unintentional weight loss, skin rash, eye pain, sinus congestion and sinus pain, sore throat, dysphagia,  hemoptysis , cough, dyspnea, wheezing, chest pain, palpitations, orthopnea, edema, abdominal pain, nausea, melena, diarrhea, constipation, flank pain, dysuria, hematuria, urinary  Frequency, nocturia, numbness, tingling, seizures,  Focal weakness, Loss of consciousness,  Tremor, insomnia, depression, anxiety, and suicidal ideation.      Objective:  BP 110/72 (BP Location: Left Arm, Patient Position: Sitting, Cuff Size: Normal)   Pulse 67   Temp 98.2 F (36.8 C) (Oral)   Resp 14   Ht 5' 5.75" (1.67 m)   Wt 154 lb 9.6 oz (70.1 kg)   SpO2 96%   BMI 25.14 kg/m   BP Readings from Last 3 Encounters:  01/22/18 110/72  07/24/17 120/74  01/13/17 100/68    Wt Readings from Last 3 Encounters:  01/22/18 154 lb 9.6 oz (70.1 kg)  07/24/17 150 lb 3.2 oz (68.1 kg)  01/13/17 147 lb 6.4 oz (66.9 kg)     General appearance: alert, cooperative and appears stated age Ears: normal TM's and external ear canals both ears Throat: lips, mucosa, and tongue normal; teeth and gums normal Neck: no adenopathy, no carotid bruit, supple, symmetrical, trachea midline and thyroid not enlarged, symmetric, no tenderness/mass/nodules Back: symmetric, no curvature. ROM normal. No CVA tenderness. Lungs: clear to auscultation bilaterally Heart: regular rate and rhythm, S1, S2 normal, no murmur, click, rub or gallop Abdomen: soft, non-tender; bowel sounds normal; no masses,  no organomegaly Pulses: 2+ and symmetric Skin: Skin color, texture, turgor normal. No rashes or lesions Lymph nodes: Cervical, supraclavicular, and axillary nodes normal.  Lab Results  Component Value Date   HGBA1C 5.3 07/24/2017    Lab Results  Component Value Date   CREATININE 0.84 01/22/2018   CREATININE 0.82 07/10/2017   CREATININE 0.83 01/13/2017    Lab Results  Component Value Date   WBC 4.6 07/15/2016   HGB 13.9 07/15/2016   HCT 41.5 07/15/2016   PLT 329.0 07/15/2016   GLUCOSE 100 (H) 01/22/2018   CHOL 283 (H) 07/10/2017   TRIG 101.0 07/10/2017   HDL 59.40 07/10/2017   LDLDIRECT 174.1 04/15/2013   LDLCALC 203 (H) 07/10/2017   ALT 15 01/22/2018   AST 18 01/22/2018   NA 140 01/22/2018   K 4.4 01/22/2018   CL 104 01/22/2018   CREATININE 0.84 01/22/2018   BUN 19 01/22/2018  CO2 29 01/22/2018   TSH 1.01 07/15/2016   HGBA1C 5.3 07/24/2017    Mm Screening Breast Tomo Bilateral  Result Date: 09/25/2017 CLINICAL DATA:  Screening. EXAM: DIGITAL SCREENING BILATERAL MAMMOGRAM WITH TOMO AND CAD COMPARISON:  Previous exam(s). ACR Breast Density Category c: The breast tissue is heterogeneously dense, which may obscure small masses. FINDINGS: There are no findings suspicious for malignancy. Images were processed with CAD. IMPRESSION: No mammographic evidence of malignancy. A result letter of this screening mammogram will  be mailed directly to the patient. RECOMMENDATION: Screening mammogram in one year. (Code:SM-B-01Y) BI-RADS CATEGORY  1: Negative. Electronically Signed   By: Kristine Mcguire M.D.   On: 09/25/2017 12:18    Assessment & Plan:   Problem List Items Addressed This Visit    Hyperlipidemia with target LDL less than 160    Managed with red yeast rice 600 mg bid per patient preference.   10 yr risk is 6 % using the FRC .  No changes today.   Lab Results  Component Value Date   CHOL 283 (H) 07/10/2017   HDL 59.40 07/10/2017   LDLCALC 203 (H) 07/10/2017   LDLDIRECT 174.1 04/15/2013   TRIG 101.0 07/10/2017   CHOLHDL 5 07/10/2017   Lab Results  Component Value Date   ALT 15 01/22/2018   AST 18 01/22/2018   ALKPHOS 84 01/22/2018   BILITOT 0.6 01/22/2018         Generalized anxiety disorder    Improved with husband's improvement in anxiety.  She has recurrent symptoms which recur when medication is temporarily stopped.  Continue 10 mg citalopram both for mood and migraine prevention. A total of 15 minutes of face to face time was spent with patient more than half of which was spent in counselling about the above mentioned conditions  and coordination of care       Relevant Medications   citalopram (CELEXA) 10 MG tablet    Other Visit Diagnoses    Long-term use of high-risk medication    -  Primary   Relevant Orders   Comprehensive metabolic panel (Completed)      I have changed Real Cons Kristine Mcguire's citalopram. I am also having her maintain her Red Yeast Rice and Vitamin D3.  Meds ordered this encounter  Medications  . citalopram (CELEXA) 10 MG tablet    Sig: Take 1 tablet (10 mg total) by mouth daily.    Dispense:  90 tablet    Refill:  1    Medications Discontinued During This Encounter  Medication Reason  . citalopram (CELEXA) 10 MG tablet Reorder    Follow-up: Return in about 6 months (around 07/24/2018) for CPE with PAP .   Kristine Mc, MD

## 2018-01-24 NOTE — Assessment & Plan Note (Signed)
Managed with red yeast rice 600 mg bid per patient preference.   10 yr risk is 6 % using the FRC .  No changes today.   Lab Results  Component Value Date   CHOL 283 (H) 07/10/2017   HDL 59.40 07/10/2017   LDLCALC 203 (H) 07/10/2017   LDLDIRECT 174.1 04/15/2013   TRIG 101.0 07/10/2017   CHOLHDL 5 07/10/2017   Lab Results  Component Value Date   ALT 15 01/22/2018   AST 18 01/22/2018   ALKPHOS 84 01/22/2018   BILITOT 0.6 01/22/2018

## 2018-01-24 NOTE — Assessment & Plan Note (Addendum)
Improved with husband's improvement in anxiety.  She has recurrent symptoms which recur when medication is temporarily stopped.  Continue 10 mg citalopram both for mood and migraine prevention. A total of 15 minutes of face to face time was spent with patient more than half of which was spent in counselling about the above mentioned conditions  and coordination of care

## 2018-07-23 ENCOUNTER — Telehealth: Payer: Self-pay | Admitting: Radiology

## 2018-07-23 DIAGNOSIS — E559 Vitamin D deficiency, unspecified: Secondary | ICD-10-CM

## 2018-07-23 DIAGNOSIS — E785 Hyperlipidemia, unspecified: Secondary | ICD-10-CM

## 2018-07-23 NOTE — Telephone Encounter (Signed)
Pt coming in for labs Monday, please place future orders. Thank you 

## 2018-07-23 NOTE — Addendum Note (Signed)
Addended by: [name] on: 07/23/2018 10:09 PM   Modules accepted: Orders

## 2018-07-27 ENCOUNTER — Other Ambulatory Visit (INDEPENDENT_AMBULATORY_CARE_PROVIDER_SITE_OTHER): Payer: BLUE CROSS/BLUE SHIELD

## 2018-07-27 DIAGNOSIS — E785 Hyperlipidemia, unspecified: Secondary | ICD-10-CM

## 2018-07-27 DIAGNOSIS — E559 Vitamin D deficiency, unspecified: Secondary | ICD-10-CM

## 2018-07-27 LAB — COMPREHENSIVE METABOLIC PANEL
ALT: 22 U/L (ref 0–35)
AST: 15 U/L (ref 0–37)
Albumin: 3.9 g/dL (ref 3.5–5.2)
Alkaline Phosphatase: 133 U/L — ABNORMAL HIGH (ref 39–117)
BUN: 11 mg/dL (ref 6–23)
CO2: 29 mEq/L (ref 19–32)
Calcium: 9.1 mg/dL (ref 8.4–10.5)
Chloride: 104 mEq/L (ref 96–112)
Creatinine, Ser: 0.73 mg/dL (ref 0.40–1.20)
GFR: 86.85 mL/min (ref >60.00)
Glucose, Bld: 100 mg/dL — ABNORMAL HIGH (ref 70–99)
Potassium: 4.5 mEq/L (ref 3.5–5.1)
Sodium: 141 mEq/L (ref 135–145)
Total Bilirubin: 0.3 mg/dL (ref 0.2–1.2)
Total Protein: 6.1 g/dL (ref 6.0–8.3)

## 2018-07-27 LAB — LIPID PANEL
Cholesterol: 183 mg/dL (ref 0–200)
HDL: 39.5 mg/dL (ref >39.00)
LDL Cholesterol: 117 mg/dL — ABNORMAL HIGH (ref 0–99)
NonHDL: 143.9
Total CHOL/HDL Ratio: 5
Triglycerides: 134 mg/dL (ref 0.0–149.0)
VLDL: 26.8 mg/dL (ref 0.0–40.0)

## 2018-07-27 LAB — VITAMIN D 25 HYDROXY (VIT D DEFICIENCY, FRACTURES): VITD: 54.7 ng/mL (ref 30.00–100.00)

## 2018-07-29 ENCOUNTER — Other Ambulatory Visit: Payer: Self-pay | Admitting: Internal Medicine

## 2018-07-29 DIAGNOSIS — R748 Abnormal levels of other serum enzymes: Secondary | ICD-10-CM

## 2018-07-29 NOTE — Progress Notes (Signed)
hepatic 

## 2018-07-30 ENCOUNTER — Encounter: Payer: Self-pay | Admitting: Internal Medicine

## 2018-07-30 ENCOUNTER — Other Ambulatory Visit (HOSPITAL_COMMUNITY)
Admission: RE | Admit: 2018-07-30 | Discharge: 2018-07-30 | Disposition: A | Discharge status: Home / Self Care | Payer: BLUE CROSS/BLUE SHIELD | Source: Ambulatory Visit | Attending: Internal Medicine | Admitting: Internal Medicine

## 2018-07-30 ENCOUNTER — Ambulatory Visit (INDEPENDENT_AMBULATORY_CARE_PROVIDER_SITE_OTHER): Payer: BLUE CROSS/BLUE SHIELD | Admitting: Internal Medicine

## 2018-07-30 VITALS — BP 100/70 | HR 60 | Temp 98.1°F | Resp 14 | Ht 65.75 in | Wt 143.8 lb

## 2018-07-30 DIAGNOSIS — Z124 Encounter for screening for malignant neoplasm of cervix: Secondary | ICD-10-CM | POA: Diagnosis present

## 2018-07-30 DIAGNOSIS — E785 Hyperlipidemia, unspecified: Secondary | ICD-10-CM

## 2018-07-30 DIAGNOSIS — Z0000 Encounter for general adult medical examination without abnormal findings: Secondary | ICD-10-CM

## 2018-07-30 DIAGNOSIS — Z1239 Encounter for other screening for malignant neoplasm of breast: Secondary | ICD-10-CM | POA: Diagnosis not present

## 2018-07-30 MED ORDER — ZOSTER VAC RECOMB ADJUVANTED 50 MCG/0.5ML IM SUSR: 0.5000 mL | Freq: Once | INTRAMUSCULAR | 1 refills | Status: AC

## 2018-07-30 NOTE — Patient Instructions (Signed)
Your cholesterol is down by nearly 100 points!  GREAT WORK!  We'll repeat your liver enzymes in one to 2 weeks.  Fasting is NOT required  Your annual mammogram has been ordered.  It is due in late February.   You are encouraged (required) to call to make your appointment at [facility]  The ShingRx vaccine is now available in local pharmacies and is much more protective than the old one  Zostavax  (it is about 97%  Effective in preventing shingles). .   It is therefore ADVISED for all interested adults over 50 to prevent shingles so I have printed you a prescription for it.  (it requires a 2nd dose 2 too 6 months after the first one) .  It will cause you to have flu  like symptoms for 2 days    Health Maintenance for Postmenopausal Women Menopause is a normal process in which your reproductive ability comes to an end. This process happens gradually over a span of months to years, usually between the ages of 19 and 81. Menopause is complete when you have missed 12 consecutive menstrual periods. It is important to talk with your health care provider about some of the most common conditions that affect postmenopausal women, such as heart disease, cancer, and bone loss (osteoporosis). Adopting a healthy lifestyle and getting preventive care can help to promote your health and wellness. Those actions can also lower your chances of developing some of these common conditions. What should I know about menopause? During menopause, you may experience a number of symptoms, such as:  Moderate-to-severe hot flashes.  Night sweats.  Decrease in sex drive.  Mood swings.  Headaches.  Tiredness.  Irritability.  Memory problems.  Insomnia. Choosing to treat or not to treat menopausal changes is an individual decision that you make with your health care provider. What should I know about hormone replacement therapy and supplements? Hormone therapy products are effective for treating  symptoms that are associated with menopause, such as hot flashes and night sweats. Hormone replacement carries certain risks, especially as you become older. If you are thinking about using estrogen or estrogen with progestin treatments, discuss the benefits and risks with your health care provider. What should I know about heart disease and stroke? Heart disease, heart attack, and stroke become more likely as you age. This may be due, in part, to the hormonal changes that your body experiences during menopause. These can affect how your body processes dietary fats, triglycerides, and cholesterol. Heart attack and stroke are both medical emergencies. There are many things that you can do to help prevent heart disease and stroke:  Have your blood pressure checked at least every 1-2 years. High blood pressure causes heart disease and increases the risk of stroke.  If you are 15-39 years old, ask your health care provider if you should take aspirin to prevent a heart attack or a stroke.  Do not use any tobacco products, including cigarettes, chewing tobacco, or electronic cigarettes. If you need help quitting, ask your health care provider.  It is important to eat a healthy diet and maintain a healthy weight. ? Be sure to include plenty of vegetables, fruits, low-fat dairy products, and lean protein. ? Avoid eating foods that are high in solid fats, added sugars, or salt (sodium).  Get regular exercise. This is one of the most important things that you can do for your health. ? Try to exercise for at least 150 minutes each week.  The type of exercise that you do should increase your heart rate and make you sweat. This is known as moderate-intensity exercise. ? Try to do strengthening exercises at least twice each week. Do these in addition to the moderate-intensity exercise.  Know your numbers.Ask your health care provider to check your cholesterol and your blood glucose. Continue to have your blood  tested as directed by your health care provider.  What should I know about cancer screening? There are several types of cancer. Take the following steps to reduce your risk and to catch any cancer development as early as possible. Breast Cancer  Practice breast self-awareness. ? This means understanding how your breasts normally appear and feel. ? It also means doing regular breast self-exams. Let your health care provider know about any changes, no matter how small.  If you are 42 or older, have a clinician do a breast exam (clinical breast exam or CBE) every year. Depending on your age, family history, and medical history, it may be recommended that you also have a yearly breast X-ray (mammogram).  If you have a family history of breast cancer, talk with your health care provider about genetic screening.  If you are at high risk for breast cancer, talk with your health care provider about having an MRI and a mammogram every year.  Breast cancer (BRCA) gene test is recommended for women who have family members with BRCA-related cancers. Results of the assessment will determine the need for genetic counseling and BRCA1 and for BRCA2 testing. BRCA-related cancers include these types: ? Breast. This occurs in males or females. ? Ovarian. ? Tubal. This may also be called fallopian tube cancer. ? Cancer of the abdominal or pelvic lining (peritoneal cancer). ? Prostate. ? Pancreatic. Cervical, Uterine, and Ovarian Cancer Your health care provider may recommend that you be screened regularly for cancer of the pelvic organs. These include your ovaries, uterus, and vagina. This screening involves a pelvic exam, which includes checking for microscopic changes to the surface of your cervix (Pap test).  For women ages 21-65, health care providers may recommend a pelvic exam and a Pap test every three years. For women ages 53-65, they may recommend the Pap test and pelvic exam, combined with testing  for human papilloma virus (HPV), every five years. Some types of HPV increase your risk of cervical cancer. Testing for HPV may also be done on women of any age who have unclear Pap test results.  Other health care providers may not recommend any screening for nonpregnant women who are considered low risk for pelvic cancer and have no symptoms. Ask your health care provider if a screening pelvic exam is right for you.  If you have had past treatment for cervical cancer or a condition that could lead to cancer, you need Pap tests and screening for cancer for at least 20 years after your treatment. If Pap tests have been discontinued for you, your risk factors (such as having a new sexual partner) need to be reassessed to determine if you should start having screenings again. Some women have medical problems that increase the chance of getting cervical cancer. In these cases, your health care provider may recommend that you have screening and Pap tests more often.  If you have a family history of uterine cancer or ovarian cancer, talk with your health care provider about genetic screening.  If you have vaginal bleeding after reaching menopause, tell your health care provider.  There are currently no reliable  tests available to screen for ovarian cancer. Lung Cancer Lung cancer screening is recommended for adults 85-36 years old who are at high risk for lung cancer because of a history of smoking. A yearly low-dose CT scan of the lungs is recommended if you:  Currently smoke.  Have a history of at least 30 pack-years of smoking and you currently smoke or have quit within the past 15 years. A pack-year is smoking an average of one pack of cigarettes per day for one year. Yearly screening should:  Continue until it has been 15 years since you quit.  Stop if you develop a health problem that would prevent you from having lung cancer treatment. Colorectal Cancer  This type of cancer can be detected  and can often be prevented.  Routine colorectal cancer screening usually begins at age 43 and continues through age 20.  If you have risk factors for colon cancer, your health care provider may recommend that you be screened at an earlier age.  If you have a family history of colorectal cancer, talk with your health care provider about genetic screening.  Your health care provider may also recommend using home test kits to check for hidden blood in your stool.  A small camera at the end of a tube can be used to examine your colon directly (sigmoidoscopy or colonoscopy). This is done to check for the earliest forms of colorectal cancer.  Direct examination of the colon should be repeated every 5-10 years until age 72. However, if early forms of precancerous polyps or small growths are found or if you have a family history or genetic risk for colorectal cancer, you may need to be screened more often. Skin Cancer  Check your skin from head to toe regularly.  Monitor any moles. Be sure to tell your health care provider: ? About any new moles or changes in moles, especially if there is a change in a mole's shape or color. ? If you have a mole that is larger than the size of a pencil eraser.  If any of your family members has a history of skin cancer, especially at a young age, talk with your health care provider about genetic screening.  Always use sunscreen. Apply sunscreen liberally and repeatedly throughout the day.  Whenever you are outside, protect yourself by wearing long sleeves, pants, a wide-brimmed hat, and sunglasses. What should I know about osteoporosis? Osteoporosis is a condition in which bone destruction happens more quickly than new bone creation. After menopause, you may be at an increased risk for osteoporosis. To help prevent osteoporosis or the bone fractures that can happen because of osteoporosis, the following is recommended:  If you are 75-43 years old, get at least  1,000 mg of calcium and at least 600 mg of vitamin D per day.  If you are older than age 94 but younger than age 43, get at least 1,200 mg of calcium and at least 600 mg of vitamin D per day.  If you are older than age 6, get at least 1,200 mg of calcium and at least 800 mg of vitamin D per day. Smoking and excessive alcohol intake increase the risk of osteoporosis. Eat foods that are rich in calcium and vitamin D, and do weight-bearing exercises several times each week as directed by your health care provider. What should I know about how menopause affects my mental health? Depression may occur at any age, but it is more common as you become older. Common  symptoms of depression include:  Low or sad mood.  Changes in sleep patterns.  Changes in appetite or eating patterns.  Feeling an overall lack of motivation or enjoyment of activities that you previously enjoyed.  Frequent crying spells. Talk with your health care provider if you think that you are experiencing depression. What should I know about immunizations? It is important that you get and maintain your immunizations. These include:  Tetanus, diphtheria, and pertussis (Tdap) booster vaccine.  Influenza every year before the flu season begins.  Pneumonia vaccine.  Shingles vaccine. Your health care provider may also recommend other immunizations. This information is not intended to replace advice given to you by your health care provider. Make sure you discuss any questions you have with your health care provider. Document Released: 09/20/2005 Document Revised: 02/16/2016 Document Reviewed: 05/02/2015 Elsevier Interactive Patient Education  2019 [employer].

## 2018-07-30 NOTE — Progress Notes (Signed)
Patient ID: Kristine Mcguire, female    DOB: 1961/04/06  Age: 57 y.o. MRN: [id number]  The patient is here for annual preventive examination and management of other chronic and acute problems.   The risk factors are reflected in the social history.  The roster of all physicians providing medical care to patient - is listed in the Snapshot section of the chart.  Activities of daily living:  The patient is 100% independent in all ADLs: dressing, toileting, feeding as well as independent mobility  Home safety : The patient has smoke detectors in the home. They wear seatbelts.  There are no firearms at home. There is no violence in the home.   There is no risks for hepatitis, STDs or HIV. There is no   history of blood transfusion. They have no travel history to infectious disease endemic areas of the world.  The patient has seen their dentist in the last six month. She has seen her eye doctor in the last year. She denies hearing difficulty with regard to whispered voices and some television programs.   She does not  have excessive sun exposure. Discussed the need for sun protection: hats, long sleeves and use of sunscreen if there is significant sun exposure.   Diet: the importance of a healthy diet is discussed. They do have a healthy diet.  The benefits of regular aerobic exercise were discussed. She walks 4 times per week ,  20 minutes.   Depression screen: there are no signs or vegative symptoms of depression- irritability, change in appetite, anhedonia, sadness/tearfullness.  Cognitive assessment: the patient manages all their financial and personal affairs and is actively engaged. They could relate day,date,year and events; recalled 2/3 objects at 3 minutes; performed clock-face test normally.  The following portions of the patient's history were reviewed and updated as appropriate: allergies, current medications, past family history, past medical history,  past surgical history, past  social history  and problem list.  Visual acuity was not assessed per patient preference since she has regular follow up with her ophthalmologist. Hearing and body mass index were assessed and reviewed.   During the course of the visit the patient was educated and counseled about appropriate screening and preventive services including : fall prevention , diabetes screening, nutrition counseling, colorectal cancer screening, and recommended immunizations.    CC: The primary encounter diagnosis was Cervical cancer screening. Diagnoses of Breast cancer screening, Encounter for preventive health examination, and Hyperlipidemia with target LDL less than 160 were also pertinent to this visit.  History Reana has a past medical history of History of chicken pox, Migraine, and UTI (urinary tract infection).   She has a past surgical history that includes Cesarean section (1992) and Debridement tennis elbow (208).   Her family history includes Alzheimer's disease in her paternal grandmother; Breast cancer in her maternal aunt; Cancer in her father; Heart attack (age of onset: 79) in her maternal uncle; Heart attack (age of onset: 36) in her maternal grandfather; Heart attack (age of onset: 71) in her maternal grandmother; Hyperlipidemia in her mother; Hypertension (age of onset: 3) in her mother; Pulmonary fibrosis in her maternal grandmother and paternal uncle; Pulmonary fibrosis (age of onset: 61) in her brother.She reports that she has never smoked. She has never used smokeless tobacco. She reports current alcohol use. She reports that she does not use drugs.  Outpatient Medications Prior to Visit  Medication Sig Dispense Refill  . Cholecalciferol (VITAMIN D3) 1000 UNITS CHEW Chew 2 tablets  by mouth daily.     . citalopram (CELEXA) 10 MG tablet Take 1 tablet (10 mg total) by mouth daily. 90 tablet 1  . Red Yeast Rice 600 MG CAPS Take 1 capsule by mouth 2 (two) times daily.     No  facility-administered medications prior to visit.     Review of Systems   Patient denies headache, fevers, malaise, unintentional weight loss, skin rash, eye pain, sinus congestion and sinus pain, sore throat, dysphagia,  hemoptysis , cough, dyspnea, wheezing, chest pain, palpitations, orthopnea, edema, abdominal pain, nausea, melena, diarrhea, constipation, flank pain, dysuria, hematuria, urinary  Frequency, nocturia, numbness, tingling, seizures,  Focal weakness, Loss of consciousness,  Tremor, insomnia, depression, anxiety, and suicidal ideation.      Objective:  BP 100/70 (BP Location: Left Arm, Patient Position: Sitting, Cuff Size: Normal)   Pulse 60   Temp 98.1 F (36.7 C) (Oral)   Resp 14   Ht 5' 5.75" (1.67 m)   Wt 143 lb 12.8 oz (65.2 kg)   SpO2 96%   BMI 23.39 kg/m   Physical Exam   General Appearance:    Alert, cooperative, no distress, appears stated age  Head:    Normocephalic, without obvious abnormality, atraumatic  Eyes:    PERRL, conjunctiva/corneas clear, EOM's intact, fundi    benign, both eyes  Ears:    Normal TM's and external ear canals, both ears  Nose:   Nares normal, septum midline, mucosa normal, no drainage    or sinus tenderness  Throat:   Lips, mucosa, and tongue normal; teeth and gums normal  Neck:   Supple, symmetrical, trachea midline, no adenopathy;    thyroid:  no enlargement/tenderness/nodules; no carotid   bruit or JVD  Back:     Symmetric, no curvature, ROM normal, no CVA tenderness  Lungs:     Clear to auscultation bilaterally, respirations unlabored  Chest Wall:    No tenderness or deformity   Heart:    Regular rate and rhythm, S1 and S2 normal, no murmur, rub   or gallop  Breast Exam:    No tenderness, masses, or nipple abnormality  Abdomen:     Soft, non-tender, bowel sounds active all four quadrants,    no masses, no organomegaly  Genitalia:    Pelvic: cervix normal in appearance, external genitalia normal, no adnexal masses or  tenderness, no cervical motion tenderness, rectovaginal septum normal, uterus normal size, shape, and consistency and vagina normal without discharge  Extremities:   Extremities normal, atraumatic, no cyanosis or edema  Pulses:   2+ and symmetric all extremities  Skin:   Skin color, texture, turgor normal, no rashes or lesions  Lymph nodes:   Cervical, supraclavicular, and axillary nodes normal  Neurologic:   CNII-XII intact, normal strength, sensation and reflexes    throughout      Assessment & Plan:   Problem List Items Addressed This Visit    Encounter for preventive health examination    Annual comprehensive preventive exam was done as well as an evaluation and management of chronic conditions .  During the course of the visit the patient was educated and counseled about appropriate screening and preventive services including :  diabetes screening, lipid analysis with projected  10 year  risk for CAD , nutrition counseling, breast, cervical and colorectal cancer screening, and recommended immunizations.  Printed recommendations for health maintenance screenings was given      Hyperlipidemia with target LDL less than 160    Managed with  red yeast rice 600 mg bid per patient preference.   10 yr risk is 6 % using the FRC .  No changes today.   Lab Results  Component Value Date   CHOL 183 07/27/2018   HDL 39.50 07/27/2018   LDLCALC 117 (H) 07/27/2018   LDLDIRECT 174.1 04/15/2013   TRIG 134.0 07/27/2018   CHOLHDL 5 07/27/2018   Lab Results  Component Value Date   ALT 22 07/27/2018   AST 15 07/27/2018   ALKPHOS 133 (H) 07/27/2018   BILITOT 0.3 07/27/2018          Other Visit Diagnoses    Cervical cancer screening    -  Primary   Relevant Orders   Cytology - PAP( [place])   Breast cancer screening       Relevant Orders   MM 3D SCREEN BREAST BILATERAL      I am having Kristine Mcguire start on Zoster Vaccine Adjuvanted. I am also having her maintain her Red  Yeast Rice, Vitamin D3, and citalopram.  Meds ordered this encounter  Medications  . Zoster Vaccine Adjuvanted [facility]) injection    Sig: Inject 0.5 mLs into the muscle once for 1 dose.    Dispense:  1 each    Refill:  1    There are no discontinued medications.  Follow-up: No follow-ups on file.   [name], MD

## 2018-08-02 NOTE — Assessment & Plan Note (Signed)
Annual comprehensive preventive exam was done as well as an evaluation and management of chronic conditions .  During the course of the visit the patient was educated and counseled about appropriate screening and preventive services including :  diabetes screening, lipid analysis with projected  10 year  risk for CAD , nutrition counseling, breast, cervical and colorectal cancer screening, and recommended immunizations.  Printed recommendations for health maintenance screenings was given 

## 2018-08-02 NOTE — Assessment & Plan Note (Signed)
Managed with red yeast rice 600 mg bid per patient preference.   10 yr risk is 6 % using the FRC .  No changes today.   Lab Results  Component Value Date   CHOL 183 07/27/2018   HDL 39.50 07/27/2018   LDLCALC 117 (H) 07/27/2018   LDLDIRECT 174.1 04/15/2013   TRIG 134.0 07/27/2018   CHOLHDL 5 07/27/2018   Lab Results  Component Value Date   ALT 22 07/27/2018   AST 15 07/27/2018   ALKPHOS 133 (H) 07/27/2018   BILITOT 0.3 07/27/2018

## 2018-08-03 LAB — CYTOLOGY - PAP: Diagnosis: NEGATIVE

## 2018-08-13 ENCOUNTER — Other Ambulatory Visit (INDEPENDENT_AMBULATORY_CARE_PROVIDER_SITE_OTHER): Payer: BLUE CROSS/BLUE SHIELD

## 2018-08-13 DIAGNOSIS — R748 Abnormal levels of other serum enzymes: Secondary | ICD-10-CM

## 2018-08-13 LAB — HEPATIC FUNCTION PANEL
ALT: 22 U/L (ref 0–35)
AST: 21 U/L (ref 0–37)
Albumin: 4.2 g/dL (ref 3.5–5.2)
Alkaline Phosphatase: 96 U/L (ref 39–117)
Bilirubin, Direct: 0.1 mg/dL (ref 0.0–0.3)
Total Bilirubin: 0.5 mg/dL (ref 0.2–1.2)
Total Protein: 6.7 g/dL (ref 6.0–8.3)

## 2018-08-31 ENCOUNTER — Encounter: Payer: Self-pay | Admitting: Internal Medicine

## 2018-09-27 ENCOUNTER — Other Ambulatory Visit: Payer: Self-pay | Admitting: Internal Medicine

## 2018-09-29 ENCOUNTER — Ambulatory Visit
Admission: RE | Admit: 2018-09-29 | Discharge: 2018-09-29 | Disposition: A | Discharge status: Home / Self Care | Payer: BLUE CROSS/BLUE SHIELD | Source: Ambulatory Visit | Attending: Internal Medicine | Admitting: Internal Medicine

## 2018-09-29 DIAGNOSIS — Z1231 Encounter for screening mammogram for malignant neoplasm of breast: Secondary | ICD-10-CM | POA: Diagnosis not present

## 2018-09-29 DIAGNOSIS — Z1239 Encounter for other screening for malignant neoplasm of breast: Secondary | ICD-10-CM

## 2019-03-29 ENCOUNTER — Other Ambulatory Visit: Payer: Self-pay | Admitting: Internal Medicine

## 2019-04-22 ENCOUNTER — Other Ambulatory Visit: Payer: Self-pay | Admitting: Internal Medicine

## 2019-07-27 ENCOUNTER — Other Ambulatory Visit: Payer: Self-pay | Admitting: Internal Medicine

## 2019-09-28 ENCOUNTER — Other Ambulatory Visit: Payer: Self-pay | Admitting: Internal Medicine

## 2019-09-28 DIAGNOSIS — Z1231 Encounter for screening mammogram for malignant neoplasm of breast: Secondary | ICD-10-CM

## 2019-10-26 ENCOUNTER — Ambulatory Visit
Admission: RE | Admit: 2019-10-26 | Discharge: 2019-10-26 | Disposition: A | Discharge status: Home / Self Care | Payer: BC Managed Care – PPO | Source: Ambulatory Visit | Attending: Internal Medicine | Admitting: Internal Medicine

## 2019-10-26 DIAGNOSIS — Z1231 Encounter for screening mammogram for malignant neoplasm of breast: Secondary | ICD-10-CM | POA: Insufficient documentation

## 2019-10-30 ENCOUNTER — Other Ambulatory Visit: Payer: Self-pay | Admitting: Internal Medicine

## 2019-11-02 ENCOUNTER — Other Ambulatory Visit: Payer: Self-pay

## 2019-11-03 NOTE — Telephone Encounter (Signed)
Pt called to check on refill status she is completely out

## 2019-11-04 MED ORDER — CITALOPRAM HYDROBROMIDE 10 MG PO TABS: 10.0000 mg | ORAL_TABLET | Freq: Every day | ORAL | 0 refills | Status: DC

## 2019-11-04 NOTE — Telephone Encounter (Signed)
Pt called in and said she was unaware she needed an appt until she was just told. I scheduled her an appt for 11/26/19 the first available. She is wondering if a prescription could be called in to [facility] on [address] since she has now scheduled appt? She is completely out.

## 2019-11-04 NOTE — Telephone Encounter (Signed)
Pt scheduled an appt for 11/26/2019. I refilled medication for 30 days only since pt is completely out of medication.

## 2019-11-04 NOTE — Telephone Encounter (Signed)
Last OV was 07/30/18 and no recent or future appts, will route to PCP for review

## 2019-11-04 NOTE — Telephone Encounter (Signed)
Med refill denied,  Has not been seen since Dec 2019

## 2019-11-08 DIAGNOSIS — E559 Vitamin D deficiency, unspecified: Secondary | ICD-10-CM

## 2019-11-08 DIAGNOSIS — E785 Hyperlipidemia, unspecified: Secondary | ICD-10-CM

## 2019-11-08 DIAGNOSIS — R5383 Other fatigue: Secondary | ICD-10-CM

## 2019-11-09 NOTE — Telephone Encounter (Signed)
Sent pt a mycahrt letting her know that this medication was refilled on 11/04/2019. Pt sent a mychart back stating that she would call the pharmacy and check on it.

## 2019-11-11 NOTE — Telephone Encounter (Signed)
LMTCB to schedule a fasting lab appt.

## 2019-11-11 NOTE — Telephone Encounter (Signed)
Pt scheduled fasting labs 11/23/19.

## 2019-11-23 ENCOUNTER — Other Ambulatory Visit (INDEPENDENT_AMBULATORY_CARE_PROVIDER_SITE_OTHER): Payer: BC Managed Care – PPO

## 2019-11-23 ENCOUNTER — Other Ambulatory Visit: Payer: Self-pay

## 2019-11-23 DIAGNOSIS — E785 Hyperlipidemia, unspecified: Secondary | ICD-10-CM

## 2019-11-23 DIAGNOSIS — E559 Vitamin D deficiency, unspecified: Secondary | ICD-10-CM | POA: Diagnosis not present

## 2019-11-23 DIAGNOSIS — R5383 Other fatigue: Secondary | ICD-10-CM

## 2019-11-23 LAB — COMPREHENSIVE METABOLIC PANEL
ALT: 27 U/L (ref 0–35)
AST: 25 U/L (ref 0–37)
Albumin: 4.3 g/dL (ref 3.5–5.2)
Alkaline Phosphatase: 84 U/L (ref 39–117)
BUN: 15 mg/dL (ref 6–23)
CO2: 28 mEq/L (ref 19–32)
Calcium: 9.4 mg/dL (ref 8.4–10.5)
Chloride: 105 mEq/L (ref 96–112)
Creatinine, Ser: 0.77 mg/dL (ref 0.40–1.20)
GFR: 76.49 mL/min (ref >60.00)
Glucose, Bld: 95 mg/dL (ref 70–99)
Potassium: 4 mEq/L (ref 3.5–5.1)
Sodium: 139 mEq/L (ref 135–145)
Total Bilirubin: 0.6 mg/dL (ref 0.2–1.2)
Total Protein: 6.7 g/dL (ref 6.0–8.3)

## 2019-11-23 LAB — CBC WITH DIFFERENTIAL/PLATELET
Basophils Absolute: 0 10*3/uL (ref 0.0–0.1)
Basophils Relative: 0.8 % (ref 0.0–3.0)
Eosinophils Absolute: 0.1 10*3/uL (ref 0.0–0.7)
Eosinophils Relative: 1.9 % (ref 0.0–5.0)
HCT: 40.2 % (ref 36.0–46.0)
Hemoglobin: 13.6 g/dL (ref 12.0–15.0)
Lymphocytes Relative: 45.2 % (ref 12.0–46.0)
Lymphs Abs: 2.4 10*3/uL (ref 0.7–4.0)
MCHC: 33.8 g/dL (ref 30.0–36.0)
MCV: 91.3 fl (ref 78.0–100.0)
Monocytes Absolute: 0.5 10*3/uL (ref 0.1–1.0)
Monocytes Relative: 8.7 % (ref 3.0–12.0)
Neutro Abs: 2.3 10*3/uL (ref 1.4–7.7)
Neutrophils Relative %: 43.4 % (ref 43.0–77.0)
Platelets: 308 10*3/uL (ref 150.0–400.0)
RBC: 4.4 Mil/uL (ref 3.87–5.11)
RDW: 12.7 % (ref 11.5–15.5)
WBC: 5.2 10*3/uL (ref 4.0–10.5)

## 2019-11-23 LAB — LIPID PANEL
Cholesterol: 275 mg/dL — ABNORMAL HIGH (ref 0–200)
HDL: 57.8 mg/dL (ref >39.00)
LDL Cholesterol: 195 mg/dL — ABNORMAL HIGH (ref 0–99)
NonHDL: 216.8
Total CHOL/HDL Ratio: 5
Triglycerides: 111 mg/dL (ref 0.0–149.0)
VLDL: 22.2 mg/dL (ref 0.0–40.0)

## 2019-11-23 LAB — VITAMIN D 25 HYDROXY (VIT D DEFICIENCY, FRACTURES): VITD: 53.33 ng/mL (ref 30.00–100.00)

## 2019-11-23 LAB — TSH: TSH: 1.54 u[IU]/mL (ref 0.35–4.50)

## 2019-11-26 ENCOUNTER — Encounter: Payer: Self-pay | Admitting: Internal Medicine

## 2019-11-26 ENCOUNTER — Ambulatory Visit: Payer: BC Managed Care – PPO | Admitting: Internal Medicine

## 2019-11-26 ENCOUNTER — Other Ambulatory Visit: Payer: Self-pay

## 2019-11-26 VITALS — BP 100/70 | HR 62 | Temp 97.3°F | Resp 14 | Ht 65.75 in | Wt 147.4 lb

## 2019-11-26 DIAGNOSIS — E785 Hyperlipidemia, unspecified: Secondary | ICD-10-CM | POA: Diagnosis not present

## 2019-11-26 DIAGNOSIS — Z0000 Encounter for general adult medical examination without abnormal findings: Secondary | ICD-10-CM

## 2019-11-26 DIAGNOSIS — Z1211 Encounter for screening for malignant neoplasm of colon: Secondary | ICD-10-CM

## 2019-11-26 DIAGNOSIS — G43109 Migraine with aura, not intractable, without status migrainosus: Secondary | ICD-10-CM | POA: Diagnosis not present

## 2019-11-26 MED ORDER — ZOSTER VAC RECOMB ADJUVANTED 50 MCG/0.5ML IM SUSR: 0.5000 mL | Freq: Once | INTRAMUSCULAR | 1 refills | Status: AC

## 2019-11-26 MED ORDER — CITALOPRAM HYDROBROMIDE 10 MG PO TABS: 10.0000 mg | ORAL_TABLET | Freq: Every day | ORAL | 3 refills | Status: DC

## 2019-11-26 NOTE — Patient Instructions (Signed)
Good to see you!   Referral to [facility] is under way    The ShingRx vaccine is now available in local pharmacies and is much more protective than the old one  Zostavax  (it is about 97%  Effective in preventing shingles). .   It is therefore ADVISED for all interested adults over 50 to prevent shingles so I have printed you a prescription for it.  (it requires a 2nd dose 2 too 6 months after the first one) .  It will cause you to have flu  like symptoms for 2 days

## 2019-11-26 NOTE — Progress Notes (Signed)
Subjective:  Patient ID: Kristine Mcguire, female    DOB: [birth year]  Age: 59 y.o. MRN: [id number]  CC: There were no encounter diagnoses.  HPI PANZY WALN presents for follow up on history of migraines  And GAD managed with citalopram since menopause   This visit occurred during the SARS-CoV-2 public health emergency.  Safety protocols were in place, including screening questions prior to the visit, additional usage of staff PPE, and extensive cleaning of exam room while observing appropriate contact time as indicated for disinfecting solutions.    Patient has received both doses of the [place] 19 vaccine without complications.  Patient continues to mask when outside of the home except when walking in yard or at safe distances from others .  Patient denies any change in mood or development of unhealthy behaviors resuting from the pandemic's restriction of activities and socialization.    Needs colonoscopy and shingles vaccine .  Discussed  PAP normal 2019 Mammogram normal march 2021      Outpatient Medications Prior to Visit  Medication Sig Dispense Refill  . Cholecalciferol (VITAMIN D3) 1000 UNITS CHEW Chew 2 tablets by mouth daily.     . citalopram (CELEXA) 10 MG tablet Take 1 tablet (10 mg total) by mouth daily. 30 tablet 0  . Red Yeast Rice 600 MG CAPS Take 1 capsule by mouth 2 (two) times daily.     No facility-administered medications prior to visit.    Review of Systems;  Patient denies headache, fevers, malaise, unintentional weight loss, skin rash, eye pain, sinus congestion and sinus pain, sore throat, dysphagia,  hemoptysis , cough, dyspnea, wheezing, chest pain, palpitations, orthopnea, edema, abdominal pain, nausea, melena, diarrhea, constipation, flank pain, dysuria, hematuria, urinary  Frequency, nocturia, numbness, tingling, seizures,  Focal weakness, Loss of consciousness,  Tremor, insomnia, depression, anxiety, and suicidal ideation.       Objective:  BP 100/70 (BP Location: Left Arm, Patient Position: Sitting, Cuff Size: Normal)   Pulse 62   Temp (!) 97.3 F (36.3 C) (Temporal)   Resp 14   Ht 5' 5.75" (1.67 m)   Wt 147 lb 6.4 oz (66.9 kg)   SpO2 98%   BMI 23.97 kg/m   BP Readings from Last 3 Encounters:  11/26/19 100/70  07/30/18 100/70  01/22/18 110/72    Wt Readings from Last 3 Encounters:  11/26/19 147 lb 6.4 oz (66.9 kg)  07/30/18 143 lb 12.8 oz (65.2 kg)  01/22/18 154 lb 9.6 oz (70.1 kg)    General appearance: alert, cooperative and appears stated age Ears: normal TM's and external ear canals both ears Throat: lips, mucosa, and tongue normal; teeth and gums normal Neck: no adenopathy, no carotid bruit, supple, symmetrical, trachea midline and thyroid not enlarged, symmetric, no tenderness/mass/nodules Back: symmetric, no curvature. ROM normal. No CVA tenderness. Lungs: clear to auscultation bilaterally Heart: regular rate and rhythm, S1, S2 normal, no murmur, click, rub or gallop Abdomen: soft, non-tender; bowel sounds normal; no masses,  no organomegaly Pulses: 2+ and symmetric Skin: Skin color, texture, turgor normal. No rashes or lesions Lymph nodes: Cervical, supraclavicular, and axillary nodes normal. Psych: affect normal, makes good eye contact. No fidgeting,  Smiles easily.  Denies suicidal thoughts   Lab Results  Component Value Date   HGBA1C 5.3 07/24/2017    Lab Results  Component Value Date   CREATININE 0.77 11/23/2019   CREATININE 0.73 07/27/2018   CREATININE 0.84 01/22/2018    Lab Results  Component Value Date  WBC 5.2 11/23/2019   HGB 13.6 11/23/2019   HCT 40.2 11/23/2019   PLT 308.0 11/23/2019   GLUCOSE 95 11/23/2019   CHOL 275 (H) 11/23/2019   TRIG 111.0 11/23/2019   HDL 57.80 11/23/2019   LDLDIRECT 174.1 04/15/2013   LDLCALC 195 (H) 11/23/2019   ALT 27 11/23/2019   AST 25 11/23/2019   NA 139 11/23/2019   K 4.0 11/23/2019   CL 105 11/23/2019   CREATININE  0.77 11/23/2019   BUN 15 11/23/2019   CO2 28 11/23/2019   TSH 1.54 11/23/2019   HGBA1C 5.3 07/24/2017    MM 3D SCREEN BREAST BILATERAL  Result Date: 10/26/2019 CLINICAL DATA:  Screening. EXAM: DIGITAL SCREENING BILATERAL MAMMOGRAM WITH TOMO AND CAD COMPARISON:  Previous exam(s). ACR Breast Density Category c: The breast tissue is heterogeneously dense, which may obscure small masses. FINDINGS: There are no findings suspicious for malignancy. Images were processed with CAD. IMPRESSION: No mammographic evidence of malignancy. A result letter of this screening mammogram will be mailed directly to the patient. RECOMMENDATION: Screening mammogram in one year. (Code:SM-B-01Y) BI-RADS CATEGORY  1: Negative. Electronically Signed   By: Margarette Canada M.D.   On: 10/26/2019 12:37    Assessment & Plan:   Problem List Items Addressed This Visit    None      I am having Kristine Mcguire maintain her Red Yeast Rice, Vitamin D3, and citalopram.  No orders of the defined types were placed in this encounter.   There are no discontinued medications.  Follow-up: No follow-ups on file.   [name], MD

## 2019-11-26 NOTE — Assessment & Plan Note (Signed)
Even though her cholesterol is high,  Her 10 yr risk is 6%.  Continue RED YEAST RICE  Lab Results  Component Value Date   CHOL 275 (H) 11/23/2019   HDL 57.80 11/23/2019   LDLCALC 195 (H) 11/23/2019   LDLDIRECT 174.1 04/15/2013   TRIG 111.0 11/23/2019   CHOLHDL 5 11/23/2019

## 2019-11-26 NOTE — Assessment & Plan Note (Signed)

## 2019-11-26 NOTE — Assessment & Plan Note (Signed)
SHE HAS ocular migraines and use citalopram to prevent recurrence ,  Triggered by anxiety .  Refills for one year given

## 2020-03-08 ENCOUNTER — Telehealth: Payer: Self-pay | Admitting: Internal Medicine

## 2020-03-08 NOTE — Telephone Encounter (Signed)
Rejection Reason - Patient did not respond" °[facility] said 1 day ago °

## 2020-10-04 ENCOUNTER — Telehealth (INDEPENDENT_AMBULATORY_CARE_PROVIDER_SITE_OTHER): Payer: Self-pay | Admitting: Gastroenterology

## 2020-10-04 ENCOUNTER — Other Ambulatory Visit: Payer: Self-pay

## 2020-10-04 DIAGNOSIS — Z1211 Encounter for screening for malignant neoplasm of colon: Secondary | ICD-10-CM

## 2020-10-04 MED ORDER — PEG 3350-KCL-NA BICARB-NACL 420 G PO SOLR: 4000.0000 mL | Freq: Once | ORAL | 0 refills | Status: AC

## 2020-10-04 NOTE — Progress Notes (Signed)
Gastroenterology Pre-Procedure Review  Request Date: 10/30/20 Requesting Physician: Dr. Vicente Males  PATIENT REVIEW QUESTIONS: The patient responded to the following health history questions as indicated:    1. Are you having any GI issues? no 2. Do you have a personal history of Polyps? no 3. Do you have a family history of Colon Cancer or Polyps? no 4. Diabetes Mellitus? no 5. Joint replacements in the past 12 months?no 6. Major health problems in the past 3 months?no 7. Any artificial heart valves, MVP, or defibrillator?no    MEDICATIONS & ALLERGIES:    Patient reports the following regarding taking any anticoagulation/antiplatelet therapy:   Plavix, Coumadin, Eliquis, Xarelto, Lovenox, Pradaxa, Brilinta, or Effient? no Aspirin? no  Patient confirms/reports the following medications:  Current Outpatient Medications  Medication Sig Dispense Refill  . Cholecalciferol (VITAMIN D3) 1000 UNITS CHEW Chew 2 tablets by mouth daily.     . citalopram (CELEXA) 10 MG tablet Take 1 tablet (10 mg total) by mouth daily. 90 tablet 3  . Red Yeast Rice 600 MG CAPS Take 1 capsule by mouth 2 (two) times daily.     No current facility-administered medications for this visit.    Patient confirms/reports the following allergies:  No Known Allergies  No orders of the defined types were placed in this encounter.   AUTHORIZATION INFORMATION Primary Insurance: 1D#: Group #:  Secondary Insurance: 1D#: Group #:  SCHEDULE INFORMATION: Date:  Time: Location:

## 2020-10-09 ENCOUNTER — Other Ambulatory Visit: Payer: Self-pay | Admitting: Internal Medicine

## 2020-10-09 DIAGNOSIS — Z1231 Encounter for screening mammogram for malignant neoplasm of breast: Secondary | ICD-10-CM

## 2020-10-26 ENCOUNTER — Other Ambulatory Visit
Admission: RE | Admit: 2020-10-26 | Discharge: 2020-10-26 | Disposition: A | Discharge status: Home / Self Care | Payer: BC Managed Care – PPO | Source: Ambulatory Visit | Attending: Gastroenterology | Admitting: Gastroenterology

## 2020-10-26 ENCOUNTER — Other Ambulatory Visit: Payer: Self-pay

## 2020-10-26 DIAGNOSIS — Z20822 Contact with and (suspected) exposure to covid-19: Secondary | ICD-10-CM | POA: Insufficient documentation

## 2020-10-26 DIAGNOSIS — Z01812 Encounter for preprocedural laboratory examination: Secondary | ICD-10-CM | POA: Insufficient documentation

## 2020-10-27 LAB — SARS CORONAVIRUS 2 (TAT 6-24 HRS): SARS Coronavirus 2: NEGATIVE

## 2020-10-30 ENCOUNTER — Ambulatory Visit
Admission: RE | Admit: 2020-10-30 | Discharge: 2020-10-30 | Disposition: A | Discharge status: Home / Self Care | Payer: BC Managed Care – PPO | Attending: Gastroenterology | Admitting: Gastroenterology

## 2020-10-30 ENCOUNTER — Ambulatory Visit: Payer: BC Managed Care – PPO | Admitting: Certified Registered Nurse Anesthetist

## 2020-10-30 ENCOUNTER — Encounter: Payer: Self-pay | Admitting: Gastroenterology

## 2020-10-30 ENCOUNTER — Other Ambulatory Visit: Payer: Self-pay

## 2020-10-30 ENCOUNTER — Encounter
Admission: RE | Disposition: A | Discharge status: Home / Self Care | Payer: Self-pay | Source: Home / Self Care | Attending: Gastroenterology

## 2020-10-30 DIAGNOSIS — D122 Benign neoplasm of ascending colon: Secondary | ICD-10-CM | POA: Diagnosis not present

## 2020-10-30 DIAGNOSIS — Z803 Family history of malignant neoplasm of breast: Secondary | ICD-10-CM | POA: Insufficient documentation

## 2020-10-30 DIAGNOSIS — Z808 Family history of malignant neoplasm of other organs or systems: Secondary | ICD-10-CM | POA: Diagnosis not present

## 2020-10-30 DIAGNOSIS — D120 Benign neoplasm of cecum: Secondary | ICD-10-CM | POA: Diagnosis not present

## 2020-10-30 DIAGNOSIS — Z1211 Encounter for screening for malignant neoplasm of colon: Secondary | ICD-10-CM | POA: Diagnosis not present

## 2020-10-30 DIAGNOSIS — K635 Polyp of colon: Secondary | ICD-10-CM | POA: Diagnosis not present

## 2020-10-30 DIAGNOSIS — Z8619 Personal history of other infectious and parasitic diseases: Secondary | ICD-10-CM | POA: Diagnosis not present

## 2020-10-30 HISTORY — PX: COLONOSCOPY WITH PROPOFOL: SHX5780

## 2020-10-30 SURGERY — COLONOSCOPY WITH PROPOFOL
Anesthesia: General

## 2020-10-30 MED ORDER — PROPOFOL 10 MG/ML IV BOLUS
INTRAVENOUS | Status: DC | PRN
  Administered 2020-10-30: 80 mg via INTRAVENOUS

## 2020-10-30 MED ORDER — PROPOFOL 500 MG/50ML IV EMUL
INTRAVENOUS | Status: DC | PRN
  Administered 2020-10-30: 175 ug/kg/min via INTRAVENOUS

## 2020-10-30 MED ORDER — PROPOFOL 500 MG/50ML IV EMUL
INTRAVENOUS | Status: AC
  Filled 2020-10-30: qty 50

## 2020-10-30 MED ORDER — SODIUM CHLORIDE 0.9 % IV SOLN: INTRAVENOUS | Status: DC

## 2020-10-30 MED ORDER — LIDOCAINE HCL (CARDIAC) PF 100 MG/5ML IV SOSY
PREFILLED_SYRINGE | INTRAVENOUS | Status: DC | PRN
  Administered 2020-10-30: 50 mg via INTRAVENOUS

## 2020-10-30 MED ORDER — LIDOCAINE HCL (PF) 2 % IJ SOLN
INTRAMUSCULAR | Status: AC
  Filled 2020-10-30: qty 5

## 2020-10-30 NOTE — Op Note (Signed)
[facility] Patient Name: Mintie Witherington Procedure Date: 10/30/2020 8:42 AM MRN: [id number] Account #: [id number] Date of Birth: [birth year] Admit Type: Outpatient Age: 60 Room: [facility] ENDO ROOM 4 Gender: Female Note Status: Finalized Procedure:             Colonoscopy Indications:           Screening for colorectal malignant neoplasm Providers:             Jonathon Bellows MD, MD Referring MD:          Deborra Medina, MD (Referring MD) Medicines:             Monitored Anesthesia Care Complications:         No immediate complications. Procedure:             Pre-Anesthesia Assessment:                        - Prior to the procedure, a History and Physical was                         performed, and patient medications, allergies and                         sensitivities were reviewed. The patient's tolerance                         of previous anesthesia was reviewed.                        - The risks and benefits of the procedure and the                         sedation options and risks were discussed with the                         patient. All questions were answered and informed                         consent was obtained.                        - ASA Grade Assessment: II - A patient with mild                         systemic disease.                        After obtaining informed consent, the colonoscope was                         passed under direct vision. Throughout the procedure,                         the patient's blood pressure, pulse, and oxygen                         saturations were monitored continuously. The                         Colonoscope was introduced through the anus and  advanced to the the cecum, identified by the                         appendiceal orifice. The colonoscopy was performed                         with ease. The patient tolerated the procedure well.                         The quality  of the bowel preparation was excellent. Findings:      The perianal and digital rectal examinations were normal.      A 4 mm polyp was found in the cecum. The polyp was sessile. The polyp       was removed with a jumbo cold forceps. Resection and retrieval were       complete.      A 12 mm polyp was found in the proximal ascending colon. The polyp was       sessile. The polyp was removed with a cold snare. Resection and       retrieval were complete. To prevent bleeding after the polypectomy, two       hemostatic clips were successfully placed. There was no bleeding during,       or at the end, of the procedure.      The exam was otherwise without abnormality on direct and retroflexion       views. Impression:            - One 4 mm polyp in the cecum, removed with a jumbo                         cold forceps. Resected and retrieved.                        - One 12 mm polyp in the proximal ascending colon,                         removed with a cold snare. Resected and retrieved.                         Clips were placed.                        - The examination was otherwise normal on direct and                         retroflexion views. Recommendation:        - Discharge patient to home (with escort).                        - Resume previous diet.                        - Continue present medications.                        - Await pathology results.                        - Repeat colonoscopy in 3 years for surveillance. Procedure Code(s):     ---  Professional ---                        [phone], Colonoscopy, flexible; with removal of                         tumor(s), polyp(s), or other lesion(s) by snare                         technique                        [id number], 59, Colonoscopy, flexible; with biopsy, single                         or multiple Diagnosis Code(s):     --- Professional ---                        Z12.11, Encounter for screening for malignant neoplasm                          of colon                        K63.5, Polyp of colon CPT copyright 2019 American Medical Association. All rights reserved. The codes documented in this report are preliminary and upon coder review may  be revised to meet current compliance requirements. Jonathon Bellows, MD Jonathon Bellows MD, MD 10/30/2020 9:20:16 AM This report has been signed electronically. Number of Addenda: 0 Note Initiated On: 10/30/2020 8:42 AM Scope Withdrawal Time: 0 hours 19 minutes 21 seconds  Total Procedure Duration: 0 hours 26 minutes 13 seconds  Estimated Blood Loss:  Estimated blood loss: none.      [facility]

## 2020-10-30 NOTE — H&P (Signed)
Jonathon Bellows, MD [address] [place], [place], [place], [address] [address], [place], [place], [place], [address] Phone: [phone]  Fax: [phone]  Primary Care Physician:  [name], MD   Pre-Procedure History & Physical: HPI:  Kristine Mcguire is a 60 y.o. female is here for an colonoscopy.   Past Medical History:  Diagnosis Date  . History of chicken pox   . Migraine   . UTI (urinary tract infection)     Past Surgical History:  Procedure Laterality Date  . CESAREAN SECTION  1992  . DEBRIDEMENT TENNIS ELBOW  208   Right elbow    Prior to Admission medications   Medication Sig Start Date End Date Taking? Authorizing Provider  Cholecalciferol (VITAMIN D3) 1000 UNITS CHEW Chew 2 tablets by mouth daily.    Yes [provider]  citalopram (CELEXA) 10 MG tablet Take 1 tablet (10 mg total) by mouth daily. 11/26/19  Yes [name], MD  Red Yeast Rice 600 MG CAPS Take 1 capsule by mouth 2 (two) times daily.   Yes [provider]    Allergies as of 10/04/2020  . (No Known Allergies)    Family History  Problem Relation Age of Onset  . Hyperlipidemia Mother   . Hypertension Mother 81       Runs in mother's side of family  . Cancer Father        bone  . Heart attack Maternal Uncle 46  . Heart attack Maternal Grandmother 76  . Pulmonary fibrosis Maternal Grandmother   . Heart attack Maternal Grandfather 68  . Alzheimer's disease Paternal Grandmother   . Pulmonary fibrosis Brother 64       idiopathic   . Pulmonary fibrosis Paternal Uncle   . Breast cancer Maternal Aunt        postmeno.    Social History   Socioeconomic History  . Marital status: Married    Spouse name: Not on file  . Number of children: Not on file  . Years of education: Not on file  . Highest education level: Not on file  Occupational History  . Not on file  Tobacco Use  . Smoking status: Never Smoker  . Smokeless tobacco: Never Used  Vaping  Use  . Vaping Use: Never used  Substance and Sexual Activity  . Alcohol use: Yes    Comment: none last 24hrs  . Drug use: No  . Sexual activity: Not on file  Other Topics Concern  . Not on file  Social History Narrative  . Not on file   Social Determinants of Health   Financial Resource Strain: Not on file  Food Insecurity: Not on file  Transportation Needs: Not on file  Physical Activity: Not on file  Stress: Not on file  Social Connections: Not on file  Intimate Partner Violence: Not on file    Review of Systems: See HPI, otherwise negative ROS  Physical Exam: BP (!) 143/76   Pulse 65   Temp (!) 96.7 F (35.9 C) (Temporal)   Resp 16   Ht 5\' 5"  (1.651 m)   Wt 65.3 kg   SpO2 99%   BMI 23.96 kg/m  General:   Alert,  pleasant and cooperative in NAD Head:  Normocephalic and atraumatic. Neck:  Supple; no masses or thyromegaly. Lungs:  Clear throughout to auscultation, normal respiratory effort.    Heart:  +S1, +S2, Regular rate and rhythm, No edema. Abdomen:  Soft, nontender  and nondistended. Normal bowel sounds, without guarding, and without rebound.   Neurologic:  Alert and  oriented x4;  grossly normal neurologically.  Impression/Plan: Kristine Mcguire is here for an colonoscopy to be performed for Screening colonoscopy average risk   Risks, benefits, limitations, and alternatives regarding  colonoscopy have been reviewed with the patient.  Questions have been answered.  All parties agreeable.   Jonathon Bellows, MD  10/30/2020, 8:36 AM

## 2020-10-30 NOTE — Anesthesia Procedure Notes (Signed)
Date/Time: 10/30/2020 8:45 AM Performed by: Johnna Acosta, CRNA Pre-anesthesia Checklist: Patient identified, Emergency Drugs available, Suction available, Patient being monitored and Timeout performed Patient Re-evaluated:Patient Re-evaluated prior to induction Oxygen Delivery Method: Nasal cannula Preoxygenation: Pre-oxygenation with 100% oxygen Induction Type: IV induction

## 2020-10-30 NOTE — Anesthesia Postprocedure Evaluation (Signed)
Anesthesia Post Note  Patient: Kristine Mcguire  Procedure(s) Performed: COLONOSCOPY WITH PROPOFOL (N/A )  Patient location during evaluation: Endoscopy Anesthesia Type: General Level of consciousness: awake and alert Pain management: pain level controlled Vital Signs Assessment: post-procedure vital signs reviewed and stable Respiratory status: spontaneous breathing, nonlabored ventilation, respiratory function stable and patient connected to nasal cannula oxygen Cardiovascular status: blood pressure returned to baseline and stable Postop Assessment: no apparent nausea or vomiting Anesthetic complications: no   No complications documented.   Last Vitals:  Vitals:   10/30/20 0930 10/30/20 0950  BP: 105/73 128/64  Pulse: 72 69  Resp: 18 18  Temp:    SpO2: 100% 100%    Last Pain:  Vitals:   10/30/20 0950  TempSrc:   PainSc: 0-No pain                 Martha Clan

## 2020-10-30 NOTE — Anesthesia Preprocedure Evaluation (Signed)
Anesthesia Evaluation  Patient identified by MRN, date of birth, ID band Patient awake    Reviewed: Allergy & Precautions, H&P , NPO status , Patient's Chart, lab work & pertinent test results, reviewed documented beta blocker date and time   History of Anesthesia Complications Negative for: history of anesthetic complications  Airway Mallampati: II  TM Distance: >3 FB Neck ROM: full    Dental  (+) Dental Advidsory Given, Teeth Intact, Caps   Pulmonary neg pulmonary ROS,    Pulmonary exam normal breath sounds clear to auscultation       Cardiovascular Exercise Tolerance: Good negative cardio ROS Normal cardiovascular exam Rhythm:regular Rate:Normal     Neuro/Psych  Headaches, neg Seizures PSYCHIATRIC DISORDERS Anxiety    GI/Hepatic negative GI ROS, Neg liver ROS,   Endo/Other  negative endocrine ROS  Renal/GU negative Renal ROS  negative genitourinary   Musculoskeletal   Abdominal   Peds  Hematology negative hematology ROS (+)   Anesthesia Other Findings Past Medical History: No date: History of chicken pox No date: Migraine No date: UTI (urinary tract infection)   Reproductive/Obstetrics negative OB ROS                             Anesthesia Physical Anesthesia Plan  ASA: II  Anesthesia Plan: General   Post-op Pain Management:    Induction:   PONV Risk Score and Plan: 3 and TIVA and Propofol infusion  Airway Management Planned:   Additional Equipment:   Intra-op Plan:   Post-operative Plan:   Informed Consent: I have reviewed the patients History and Physical, chart, labs and discussed the procedure including the risks, benefits and alternatives for the proposed anesthesia with the patient or authorized representative who has indicated his/her understanding and acceptance.     Dental Advisory Given  Plan Discussed with: Anesthesiologist, CRNA and  Surgeon  Anesthesia Plan Comments:         Anesthesia Quick Evaluation

## 2020-10-30 NOTE — Transfer of Care (Signed)
Immediate Anesthesia Transfer of Care Note  Patient: Kristine Mcguire  Procedure(s) Performed: COLONOSCOPY WITH PROPOFOL (N/A )  Patient Location: PACU  Anesthesia Type:General  Level of Consciousness: sedated  Airway & Oxygen Therapy: Patient Spontanous Breathing  Post-op Assessment: Report given to RN and Post -op Vital signs reviewed and stable  Post vital signs: Reviewed and stable  Last Vitals:  Vitals Value Taken Time  BP 103/65 10/30/20 0921  Temp    Pulse 85 10/30/20 0921  Resp 25 10/30/20 0921  SpO2 100 % 10/30/20 0921  Vitals shown include unvalidated device data.  Last Pain:  Vitals:   10/30/20 0920  TempSrc:   PainSc: 0-No pain         Complications: No complications documented.

## 2020-10-31 ENCOUNTER — Encounter: Payer: Self-pay | Admitting: Gastroenterology

## 2020-10-31 LAB — SURGICAL PATHOLOGY

## 2020-11-01 ENCOUNTER — Other Ambulatory Visit: Payer: Self-pay

## 2020-11-01 ENCOUNTER — Encounter: Payer: Self-pay | Admitting: Gastroenterology

## 2020-11-01 ENCOUNTER — Ambulatory Visit
Admission: RE | Admit: 2020-11-01 | Discharge: 2020-11-01 | Disposition: A | Discharge status: Home / Self Care | Payer: BC Managed Care – PPO | Source: Ambulatory Visit | Attending: Internal Medicine | Admitting: Internal Medicine

## 2020-11-01 DIAGNOSIS — Z1231 Encounter for screening mammogram for malignant neoplasm of breast: Secondary | ICD-10-CM | POA: Insufficient documentation

## 2020-11-27 ENCOUNTER — Other Ambulatory Visit: Payer: Self-pay | Admitting: Internal Medicine

## 2020-11-30 ENCOUNTER — Ambulatory Visit (INDEPENDENT_AMBULATORY_CARE_PROVIDER_SITE_OTHER): Payer: BC Managed Care – PPO | Admitting: Internal Medicine

## 2020-11-30 ENCOUNTER — Other Ambulatory Visit (HOSPITAL_COMMUNITY)
Admission: RE | Admit: 2020-11-30 | Discharge: 2020-11-30 | Disposition: A | Discharge status: Home / Self Care | Payer: BC Managed Care – PPO | Source: Ambulatory Visit | Attending: Internal Medicine | Admitting: Internal Medicine

## 2020-11-30 ENCOUNTER — Other Ambulatory Visit: Payer: Self-pay

## 2020-11-30 ENCOUNTER — Encounter: Payer: Self-pay | Admitting: Internal Medicine

## 2020-11-30 VITALS — BP 108/66 | HR 74 | Temp 96.0°F | Resp 14 | Ht 65.0 in | Wt 149.0 lb

## 2020-11-30 DIAGNOSIS — Z0000 Encounter for general adult medical examination without abnormal findings: Secondary | ICD-10-CM | POA: Diagnosis not present

## 2020-11-30 DIAGNOSIS — D126 Benign neoplasm of colon, unspecified: Secondary | ICD-10-CM | POA: Diagnosis not present

## 2020-11-30 DIAGNOSIS — Z124 Encounter for screening for malignant neoplasm of cervix: Secondary | ICD-10-CM | POA: Diagnosis present

## 2020-11-30 NOTE — Patient Instructions (Signed)

## 2020-11-30 NOTE — Progress Notes (Signed)
Patient ID: Kristine Mcguire, female    DOB: 1960-09-24  Age: 60 y.o. MRN: [id number]  The patient is here for annual preventive examination and management of other chronic and acute problems.  This visit occurred during the SARS-CoV-2 public health emergency.  Safety protocols were in place, including screening questions prior to the visit, additional usage of staff PPE, and extensive cleaning of exam room while observing appropriate contact time as indicated for disinfecting solutions.      The risk factors are reflected in the social history.  The roster of all physicians providing medical care to patient - is listed in the Snapshot section of the chart.  Activities of daily living:  The patient is 100% independent in all ADLs: dressing, toileting, feeding as well as independent mobility  Home safety : The patient has smoke detectors in the home. They wear seatbelts.  There are no firearms at home. There is no violence in the home.   There is no risks for hepatitis, STDs or HIV. There is no   history of blood transfusion. They have no travel history to infectious disease endemic areas of the world.  The patient has seen their dentist in the last six month. They have seen their eye doctor in the last year. They admit to slight hearing difficulty with regard to whispered voices and some television programs.  They have deferred audiologic testing in the last year.  They do not  have excessive sun exposure. Discussed the need for sun protection: hats, long sleeves and use of sunscreen if there is significant sun exposure.   Diet: the importance of a healthy diet is discussed. They do have a healthy diet.  The benefits of regular aerobic exercise were discussed. She exercises 5 days per week ,  60 minutes.   Depression screen: there are no signs or vegative symptoms of depression- irritability, change in appetite, anhedonia, sadness/tearfullness.  Cognitive assessment: the patient manages all  their financial and personal affairs and is actively engaged. They could relate day,date,year and events; recalled 2/3 objects at 3 minutes; performed clock-face test normally.  The following portions of the patient's history were reviewed and updated as appropriate: allergies, current medications, past family history, past medical history,  past surgical history, past social history  and problem list.  Visual acuity was not assessed per patient preference since she has regular follow up with her ophthalmologist. Hearing and body mass index were assessed and reviewed.   During the course of the visit the patient was educated and counseled about appropriate screening and preventive services including : fall prevention , diabetes screening, nutrition counseling, colorectal cancer screening, and recommended immunizations.    CC: The primary encounter diagnosis was Cervical cancer screening. Diagnoses of Encounter for preventive health examination and Tubular adenoma of colon were also pertinent to this visit.   Reviewed recent colonoscopy; advised to have a 3 yr follow up  Cc:  Occasional back pain /hip pain ; mild,  No falls,  No sciatica   History Kristine Mcguire has a past medical history of History of chicken pox, Migraine, and UTI (urinary tract infection).   She has a past surgical history that includes Cesarean section (1992); Debridement tennis elbow (208); and Colonoscopy with propofol (N/A, 10/30/2020).   Her family history includes Alzheimer's disease in her paternal grandmother; Breast cancer in her maternal aunt; Cancer in her father; Heart attack (age of onset: 60) in her maternal uncle; Heart attack (age of onset: 31) in her maternal grandfather;  Heart attack (age of onset: 57) in her maternal grandmother; Hyperlipidemia in her mother; Hypertension (age of onset: 19) in her mother; Pulmonary fibrosis in her maternal grandmother and paternal uncle; Pulmonary fibrosis (age of onset: 58) in her  brother.She reports that she has never smoked. She has never used smokeless tobacco. She reports current alcohol use. She reports that she does not use drugs.  Outpatient Medications Prior to Visit  Medication Sig Dispense Refill  . Cholecalciferol (VITAMIN D3) 1000 UNITS CHEW Chew 2 tablets by mouth daily.     . citalopram (CELEXA) 10 MG tablet TAKE 1 TABLET BY MOUTH EVERY DAY 90 tablet 3  . Red Yeast Rice 600 MG CAPS Take 1 capsule by mouth 2 (two) times daily.     No facility-administered medications prior to visit.    Review of Systems   Patient denies headache, fevers, malaise, unintentional weight loss, skin rash, eye pain, sinus congestion and sinus pain, sore throat, dysphagia,  hemoptysis , cough, dyspnea, wheezing, chest pain, palpitations, orthopnea, edema, abdominal pain, nausea, melena, diarrhea, constipation, flank pain, dysuria, hematuria, urinary  Frequency, nocturia, numbness, tingling, seizures,  Focal weakness, Loss of consciousness,  Tremor, insomnia, depression, anxiety, and suicidal ideation.      Objective:  BP 108/66 (BP Location: Left Arm, Patient Position: Sitting, Cuff Size: Normal)   Pulse 74   Temp (!) 96 F (35.6 C) (Oral)   Resp 14   Ht 5\' 5"  (1.651 m)   Wt 149 lb (67.6 kg)   SpO2 98%   BMI 24.79 kg/m   Physical Exam   General Appearance:    Alert, cooperative, no distress, appears stated age  Head:    Normocephalic, without obvious abnormality, atraumatic  Eyes:    PERRL, conjunctiva/corneas clear, EOM's intact, fundi    benign, both eyes  Ears:    Normal TM's and external ear canals, both ears  Nose:   Nares normal, septum midline, mucosa normal, no drainage    or sinus tenderness  Throat:   Lips, mucosa, and tongue normal; teeth and gums normal  Neck:   Supple, symmetrical, trachea midline, no adenopathy;    thyroid:  no enlargement/tenderness/nodules; no carotid   bruit or JVD  Back:     Symmetric, no curvature, ROM normal, no CVA  tenderness  Lungs:     Clear to auscultation bilaterally, respirations unlabored  Chest Wall:    No tenderness or deformity   Heart:    Regular rate and rhythm, S1 and S2 normal, no murmur, rub   or gallop  Breast Exam:    No tenderness, masses, or nipple abnormality  Abdomen:     Soft, non-tender, bowel sounds active all four quadrants,    no masses, no organomegaly  Genitalia:    Pelvic: cervix normal in appearance, external genitalia normal, no adnexal masses or tenderness, no cervical motion tenderness, rectovaginal septum normal, uterus normal size, shape, and consistency and vagina normal without discharge  Extremities:   Extremities normal, atraumatic, no cyanosis or edema  Pulses:   2+ and symmetric all extremities  Skin:   Skin color, texture, turgor normal, no rashes or lesions  Lymph nodes:   Cervical, supraclavicular, and axillary nodes normal  Neurologic:   CNII-XII intact, normal strength, sensation and reflexes    throughout   Assessment & Plan:   Problem List Items Addressed This Visit      Unprioritized   Encounter for preventive health examination    age appropriate education and counseling  updated, referrals for preventative services and immunizations addressed, dietary and smoking counseling addressed, most recent labs reviewed.  I have personally reviewed and have noted:  1) the patient's medical and social history 2) The pt's use of alcohol, tobacco, and illicit drugs 3) The patient's current medications and supplements 4) Functional ability including ADL's, fall risk, home safety risk, hearing and visual impairment 5) Diet and physical activities 6) Evidence for depression or mood disorder 7) The patient's height, weight, and BMI have been recorded in the chart  I have made referrals, and provided counseling and education based on review of the above      Tubular adenoma of colon    3 yr follow up colonscopy is advised,  And will be due in 2025        Other Visit Diagnoses    Cervical cancer screening    -  Primary   Relevant Orders   Cytology - PAP (Completed)      I am having Kristine Mcguire maintain her Red Yeast Rice, Vitamin D3, and citalopram.  No orders of the defined types were placed in this encounter.   There are no discontinued medications.  Follow-up: No follow-ups on file.   [name], MD

## 2020-12-01 DIAGNOSIS — D126 Benign neoplasm of colon, unspecified: Secondary | ICD-10-CM | POA: Insufficient documentation

## 2020-12-01 LAB — CYTOLOGY - PAP
Comment: NEGATIVE
Diagnosis: NEGATIVE
High risk HPV: NEGATIVE

## 2020-12-01 NOTE — Assessment & Plan Note (Signed)

## 2020-12-01 NOTE — Assessment & Plan Note (Signed)
3 yr follow up colonscopy is advised,  And will be due in 2025

## 2020-12-28 ENCOUNTER — Other Ambulatory Visit: Payer: Self-pay

## 2020-12-28 ENCOUNTER — Ambulatory Visit (INDEPENDENT_AMBULATORY_CARE_PROVIDER_SITE_OTHER): Payer: BC Managed Care – PPO | Admitting: Dermatology

## 2020-12-28 ENCOUNTER — Encounter: Payer: Self-pay | Admitting: Dermatology

## 2020-12-28 DIAGNOSIS — L578 Other skin changes due to chronic exposure to nonionizing radiation: Secondary | ICD-10-CM

## 2020-12-28 DIAGNOSIS — Z1283 Encounter for screening for malignant neoplasm of skin: Secondary | ICD-10-CM | POA: Diagnosis not present

## 2020-12-28 DIAGNOSIS — L814 Other melanin hyperpigmentation: Secondary | ICD-10-CM | POA: Diagnosis not present

## 2020-12-28 DIAGNOSIS — L821 Other seborrheic keratosis: Secondary | ICD-10-CM | POA: Diagnosis not present

## 2020-12-28 DIAGNOSIS — Z86018 Personal history of other benign neoplasm: Secondary | ICD-10-CM

## 2020-12-28 DIAGNOSIS — D229 Melanocytic nevi, unspecified: Secondary | ICD-10-CM

## 2020-12-28 DIAGNOSIS — D1800 Hemangioma unspecified site: Secondary | ICD-10-CM

## 2020-12-28 NOTE — Patient Instructions (Signed)

## 2020-12-28 NOTE — Progress Notes (Signed)
   Follow-Up Visit   Subjective  Kristine Mcguire is a 60 y.o. female who presents for the following: Total body skin exam (Hx of Dysplastic nevi). The patient presents for Total-Body Skin Exam (TBSE) for skin cancer screening and mole check.  The following portions of the chart were reviewed this encounter and updated as appropriate:   Tobacco  Allergies  Meds  Problems  Med Hx  Surg Hx  Fam Hx     Review of Systems:  No other skin or systemic complaints except as noted in HPI or Assessment and Plan.  Objective  Well appearing patient in no apparent distress; mood and affect are within normal limits.  A full examination was performed including scalp, head, eyes, ears, nose, lips, neck, chest, axillae, abdomen, back, buttocks, bilateral upper extremities, bilateral lower extremities, hands, feet, fingers, toes, fingernails, and toenails. All findings within normal limits unless otherwise noted below.  Objective  Left distal ant lat thigh, R lower back, R tricep: Scars with no evidence of recurrence.   Objective  L dorsum hand x 3, R dorsum hand x 4, R medial calf x 1: Stuck-on, waxy, tan-brown papules and plaques -- Discussed benign etiology and prognosis.    Assessment & Plan    Lentigines - Scattered tan macules - Due to sun exposure - Benign-appering, observe - Recommend daily broad spectrum sunscreen SPF 30+ to sun-exposed areas, reapply every 2 hours as needed. - Call for any changes  Seborrheic Keratoses - Stuck-on, waxy, tan-brown papules and/or plaques  - Benign-appearing - Discussed benign etiology and prognosis. - Observe - Call for any changes  Melanocytic Nevi - Tan-brown and/or pink-flesh-colored symmetric macules and papules - Benign appearing on exam today - Observation - Call clinic for new or changing moles - Recommend daily use of broad spectrum spf 30+ sunscreen to sun-exposed areas.   Hemangiomas - Red papules - Discussed benign  nature - Observe - Call for any changes  Actinic Damage - Chronic condition, secondary to cumulative UV/sun exposure - diffuse scaly erythematous macules with underlying dyspigmentation - Recommend daily broad spectrum sunscreen SPF 30+ to sun-exposed areas, reapply every 2 hours as needed.  - Staying in the shade or wearing long sleeves, sun glasses (UVA+UVB protection) and wide brim hats (4-inch brim around the entire circumference of the hat) are also recommended for sun protection.  - Call for new or changing lesions.  Skin cancer screening performed today.  History of dysplastic nevus Left distal ant lat thigh, R lower back, R tricep Clear. Observe for recurrence. Call clinic for new or changing lesions.  Recommend regular skin exams, daily broad-spectrum spf 30+ sunscreen use, and photoprotection.     Seborrheic keratosis L dorsum hand x 3, R dorsum hand x 4, R medial calf x 1 Discussed cosmetic procedure, noncovered.  $60 for 1st lesion and $15 for each additional lesion if done on the same day.  Maximum charge $350.  One touch-up treatment included no charge. Discussed risks of treatment including dyspigmentation, small scar, and/or recurrence. Recommend daily broad spectrum sunscreen SPF 30+/photoprotection to treated areas once healed.   Return in about 1 year (around 12/28/2021) for TBSE, Hx of Dysplastic nevi, 10 wks for Sk f/u.   I, Othelia Pulling, RMA, am acting as scribe for Sarina Ser, MD .  Documentation: I have reviewed the above documentation for accuracy and completeness, and I agree with the above.  Sarina Ser, MD

## 2021-01-05 ENCOUNTER — Encounter: Payer: Self-pay | Admitting: Dermatology

## 2021-02-26 ENCOUNTER — Ambulatory Visit: Payer: BC Managed Care – PPO | Admitting: Dermatology

## 2021-10-10 ENCOUNTER — Other Ambulatory Visit: Payer: Self-pay | Admitting: Internal Medicine

## 2021-10-10 DIAGNOSIS — Z1231 Encounter for screening mammogram for malignant neoplasm of breast: Secondary | ICD-10-CM

## 2021-11-15 ENCOUNTER — Other Ambulatory Visit: Payer: Self-pay | Admitting: Internal Medicine

## 2021-11-15 ENCOUNTER — Ambulatory Visit
Admission: RE | Admit: 2021-11-15 | Discharge: 2021-11-15 | Disposition: A | Discharge status: Home / Self Care | Payer: BC Managed Care – PPO | Source: Ambulatory Visit | Attending: Internal Medicine | Admitting: Internal Medicine

## 2021-11-15 DIAGNOSIS — Z1231 Encounter for screening mammogram for malignant neoplasm of breast: Secondary | ICD-10-CM | POA: Insufficient documentation

## 2021-11-22 ENCOUNTER — Encounter: Payer: Self-pay | Admitting: Internal Medicine

## 2021-11-26 ENCOUNTER — Other Ambulatory Visit: Payer: Self-pay

## 2021-11-26 DIAGNOSIS — Z0000 Encounter for general adult medical examination without abnormal findings: Secondary | ICD-10-CM

## 2021-11-28 ENCOUNTER — Other Ambulatory Visit (INDEPENDENT_AMBULATORY_CARE_PROVIDER_SITE_OTHER): Payer: BC Managed Care – PPO

## 2021-11-28 DIAGNOSIS — Z0000 Encounter for general adult medical examination without abnormal findings: Secondary | ICD-10-CM | POA: Diagnosis not present

## 2021-11-28 LAB — HEMOGLOBIN A1C: Hgb A1c MFr Bld: 5.7 % (ref 4.6–6.5)

## 2021-11-28 LAB — CBC WITH DIFFERENTIAL/PLATELET
Basophils Absolute: 0.1 10*3/uL (ref 0.0–0.1)
Basophils Relative: 1.3 % (ref 0.0–3.0)
Eosinophils Absolute: 0.1 10*3/uL (ref 0.0–0.7)
Eosinophils Relative: 2.2 % (ref 0.0–5.0)
HCT: 40.2 % (ref 36.0–46.0)
Hemoglobin: 13.5 g/dL (ref 12.0–15.0)
Lymphocytes Relative: 43.3 % (ref 12.0–46.0)
Lymphs Abs: 2.2 10*3/uL (ref 0.7–4.0)
MCHC: 33.5 g/dL (ref 30.0–36.0)
MCV: 91.9 fl (ref 78.0–100.0)
Monocytes Absolute: 0.5 10*3/uL (ref 0.1–1.0)
Monocytes Relative: 10.2 % (ref 3.0–12.0)
Neutro Abs: 2.2 10*3/uL (ref 1.4–7.7)
Neutrophils Relative %: 43 % (ref 43.0–77.0)
Platelets: 293 10*3/uL (ref 150.0–400.0)
RBC: 4.38 Mil/uL (ref 3.87–5.11)
RDW: 13.2 % (ref 11.5–15.5)
WBC: 5.1 10*3/uL (ref 4.0–10.5)

## 2021-11-28 LAB — COMPREHENSIVE METABOLIC PANEL
ALT: 16 U/L (ref 0–35)
AST: 18 U/L (ref 0–37)
Albumin: 4.3 g/dL (ref 3.5–5.2)
Alkaline Phosphatase: 92 U/L (ref 39–117)
BUN: 13 mg/dL (ref 6–23)
CO2: 29 mEq/L (ref 19–32)
Calcium: 9.4 mg/dL (ref 8.4–10.5)
Chloride: 106 mEq/L (ref 96–112)
Creatinine, Ser: 0.8 mg/dL (ref 0.40–1.20)
GFR: 79.24 mL/min (ref >60.00)
Glucose, Bld: 97 mg/dL (ref 70–99)
Potassium: 4.5 mEq/L (ref 3.5–5.1)
Sodium: 142 mEq/L (ref 135–145)
Total Bilirubin: 0.5 mg/dL (ref 0.2–1.2)
Total Protein: 6.4 g/dL (ref 6.0–8.3)

## 2021-11-28 LAB — LIPID PANEL
Cholesterol: 266 mg/dL — ABNORMAL HIGH (ref 0–200)
HDL: 55.4 mg/dL (ref >39.00)
LDL Cholesterol: 176 mg/dL — ABNORMAL HIGH (ref 0–99)
NonHDL: 210.57
Total CHOL/HDL Ratio: 5
Triglycerides: 174 mg/dL — ABNORMAL HIGH (ref 0.0–149.0)
VLDL: 34.8 mg/dL (ref 0.0–40.0)

## 2021-11-28 LAB — TSH: TSH: 1.97 u[IU]/mL (ref 0.35–5.50)

## 2021-11-28 LAB — VITAMIN D 25 HYDROXY (VIT D DEFICIENCY, FRACTURES): VITD: 44.5 ng/mL (ref 30.00–100.00)

## 2021-12-03 ENCOUNTER — Ambulatory Visit (INDEPENDENT_AMBULATORY_CARE_PROVIDER_SITE_OTHER): Payer: BC Managed Care – PPO | Admitting: Internal Medicine

## 2021-12-03 ENCOUNTER — Encounter: Payer: Self-pay | Admitting: Internal Medicine

## 2021-12-03 VITALS — BP 102/62 | HR 79 | Temp 98.0°F | Ht 65.0 in | Wt 151.8 lb

## 2021-12-03 DIAGNOSIS — Z0000 Encounter for general adult medical examination without abnormal findings: Secondary | ICD-10-CM | POA: Diagnosis not present

## 2021-12-03 DIAGNOSIS — E785 Hyperlipidemia, unspecified: Secondary | ICD-10-CM | POA: Diagnosis not present

## 2021-12-03 MED ORDER — ZOSTER VAC RECOMB ADJUVANTED 50 MCG/0.5ML IM SUSR: 0.5000 mL | Freq: Once | INTRAMUSCULAR | 1 refills | Status: AC

## 2021-12-03 MED ORDER — ESTRADIOL 0.1 MG/GM VA CREA: TOPICAL_CREAM | VAGINAL | 0 refills | Status: DC

## 2021-12-03 NOTE — Patient Instructions (Signed)
Estrace cream (see below)  1 gram every night for 2 weeks,  then twice weekly thereafter  ? ?our cholesterol is fine .  Your ten year risk of cardiac events is low at 3%  ? ?Prove to yourself you can be satisfied with smaller portions" ? ?[employer] "low carb power bowl"  entrees and  "Steamer" entrees are are great low carb entrees that microwave in 5 minutes  ?    ? ? ?

## 2021-12-03 NOTE — Assessment & Plan Note (Signed)

## 2021-12-03 NOTE — Progress Notes (Signed)
The patient is here for annual preventive examination and management of other chronic and acute problems. ?  ?The risk factors are reflected in the social history. ?  ?The roster of all physicians providing medical care to patient - is listed in the Snapshot section of the chart. ?  ?Activities of daily living:  The patient is 100% independent in all ADLs: dressing, toileting, feeding as well as independent mobility ?  ?Home safety : The patient has smoke detectors in the home. They wear seatbelts.  There are no unsecured firearms at home. There is no violence in the home.  ?  ?There is no risks for hepatitis, STDs or HIV. There is no   history of blood transfusion. They have no travel history to infectious disease endemic areas of the world. ?  ?The patient has seen their dentist in the last six month. They have seen their eye doctor in the last year. The patinet  denies slight hearing difficulty with regard to whispered voices and some television programs.  They have deferred audiologic testing in the last year.  They do not  have excessive sun exposure. Discussed the need for sun protection: hats, long sleeves and use of sunscreen if there is significant sun exposure.  ?  ?Diet: the importance of a healthy diet is discussed. They do have a healthy diet. ?  ?The benefits of regular aerobic exercise were discussed. The patient  exercises  3 to 5 days per week  for  60 minutes.  ?  ?Depression screen: there are no signs or vegative symptoms of depression- irritability, change in appetite, anhedonia, sadness/tearfullness. ?  ?The following portions of the patient's history were reviewed and updated as appropriate: allergies, current medications, past family history, past medical history,  past surgical history, past social history  and problem list. ?  ?Visual acuity was not assessed per patient preference since the patient has regular follow up with an  ophthalmologist. Hearing and body mass index were assessed and  reviewed.  ?  ?During the course of the visit the patient was educated and counseled about appropriate screening and preventive services including : fall prevention , diabetes screening, nutrition counseling, colorectal cancer screening, and recommended immunizations.   ? ?Chief Complaint: ? ? 1) weight gain. Exercising regularly.  Appetite increased  ? ?2) Hyperlipidemia:  taking RYR.  Reviewed history ,  10 yr risk using AHA risk calculator.  ? ?3) migraines with aura:   prefers to continue celexa  ? ? ?Review of Symptoms ? ?Patient denies headache, fevers, malaise, unintentional weight loss, skin rash, eye pain, sinus congestion and sinus pain, sore throat, dysphagia,  hemoptysis , cough, dyspnea, wheezing, chest pain, palpitations, orthopnea, edema, abdominal pain, nausea, melena, diarrhea, constipation, flank pain, dysuria, hematuria, urinary  Frequency, nocturia, numbness, tingling, seizures,  Focal weakness, Loss of consciousness,  Tremor, insomnia, depression, anxiety, and suicidal ideation.   ? ?Physical Exam: ? ?BP 102/62 (BP Location: Left Arm, Patient Position: Sitting, Cuff Size: Large)   Pulse 79   Temp 98 ?F (36.7 ?C) (Oral)   Ht '5\' 5"'$  (1.651 m)   Wt 151 lb 12.8 oz (68.9 kg)   SpO2 96%   BMI 25.26 kg/m?   ? ?General appearance: alert, cooperative and appears stated age ?Ears: normal TM's and external ear canals both ears ?Throat: lips, mucosa, and tongue normal; teeth and gums normal ?Neck: no adenopathy, no carotid bruit, supple, symmetrical, trachea midline and thyroid not enlarged, symmetric, no tenderness/mass/nodules ?Back: symmetric, no  curvature. ROM normal. No CVA tenderness. ?Lungs: clear to auscultation bilaterally ?Breast exam deferred (mammogram last week) ?Heart: regular rate and rhythm, S1, S2 normal, no murmur, click, rub or gallop ?Abdomen: soft, non-tender; bowel sounds normal; no masses,  no organomegaly ?Pulses: 2+ and symmetric ?Skin: Skin color, texture, turgor normal. No  rashes or lesions ?Lymph nodes: Cervical, supraclavicular, and axillary nodes normal.  ? ?Assessment and Plan: ? ?Hyperlipidemia with target LDL less than 160 ?Even though her cholesterol is high,  Her 10 yr risk is 3%.  Continue RED YEAST RICE ? ?Lab Results  ?Component Value Date  ? CHOL 266 (H) 11/28/2021  ? HDL 55.40 11/28/2021  ? LDLCALC 176 (H) 11/28/2021  ? LDLDIRECT 174.1 04/15/2013  ? TRIG 174.0 (H) 11/28/2021  ? CHOLHDL 5 11/28/2021  ? ? ? ?Encounter for preventive health examination ?age appropriate education and counseling updated, referrals for preventative services and immunizations addressed, dietary and smoking counseling addressed, most recent labs reviewed.  I have personally reviewed and have noted: ?  ?1) the patient's medical and social history ?2) The pt's use of alcohol, tobacco, and illicit drugs ?3) The patient's current medications and supplements ?4) Functional ability including ADL's, fall risk, home safety risk, hearing and visual impairment ?5) Diet and physical activities ?6) Evidence for depression or mood disorder ?7) The patient's height, weight, and BMI have been recorded in the chart  ?I have made referrals, and provided counseling and education based on review of the above ? ?Updated Medication List ?Outpatient Encounter Medications as of 12/03/2021  ?Medication Sig  ? Cholecalciferol (VITAMIN D3) 1000 UNITS CHEW Chew 2 tablets by mouth daily.   ? citalopram (CELEXA) 10 MG tablet TAKE 1 TABLET BY MOUTH EVERY DAY  ? estradiol (ESTRACE VAGINAL) 0.1 MG/GM vaginal cream 1 gram intravagainally at night for 2 weeks,  then twice weekly thereafter  ? Red Yeast Rice 600 MG CAPS Take 1 capsule by mouth 2 (two) times daily.  ? Zoster Vaccine Adjuvanted [facility]) injection Inject 0.5 mLs into the muscle once for 1 dose.  ? ?No facility-administered encounter medications on file as of 12/03/2021.  ? ? ?

## 2021-12-03 NOTE — Assessment & Plan Note (Addendum)
Even though her cholesterol is high,  Her 10 yr risk is 3%.  Continue RED YEAST RICE ? ?Lab Results  ?Component Value Date  ? CHOL 266 (H) 11/28/2021  ? HDL 55.40 11/28/2021  ? LDLCALC 176 (H) 11/28/2021  ? LDLDIRECT 174.1 04/15/2013  ? TRIG 174.0 (H) 11/28/2021  ? CHOLHDL 5 11/28/2021  ? ? ?

## 2022-01-03 ENCOUNTER — Ambulatory Visit (INDEPENDENT_AMBULATORY_CARE_PROVIDER_SITE_OTHER): Payer: BC Managed Care – PPO | Admitting: Dermatology

## 2022-01-03 DIAGNOSIS — Z1283 Encounter for screening for malignant neoplasm of skin: Secondary | ICD-10-CM | POA: Diagnosis not present

## 2022-01-03 DIAGNOSIS — L738 Other specified follicular disorders: Secondary | ICD-10-CM

## 2022-01-03 DIAGNOSIS — L578 Other skin changes due to chronic exposure to nonionizing radiation: Secondary | ICD-10-CM

## 2022-01-03 DIAGNOSIS — D229 Melanocytic nevi, unspecified: Secondary | ICD-10-CM

## 2022-01-03 DIAGNOSIS — Z86018 Personal history of other benign neoplasm: Secondary | ICD-10-CM

## 2022-01-03 DIAGNOSIS — L814 Other melanin hyperpigmentation: Secondary | ICD-10-CM

## 2022-01-03 DIAGNOSIS — D1800 Hemangioma unspecified site: Secondary | ICD-10-CM

## 2022-01-03 DIAGNOSIS — L821 Other seborrheic keratosis: Secondary | ICD-10-CM

## 2022-01-03 NOTE — Progress Notes (Signed)
   Follow-Up Visit   Subjective  Kristine Mcguire is a 61 y.o. female who presents for the following: Annual Exam (1 year tbse hx of dysplastic nevus. ). The patient presents for Total-Body Skin Exam (TBSE) for skin cancer screening and mole check.  The patient has spots, moles and lesions to be evaluated, some may be new or changing and the patient has concerns that these could be cancer.  The following portions of the chart were reviewed this encounter and updated as appropriate:  Tobacco  Allergies  Meds  Problems  Med Hx  Surg Hx  Fam Hx     Review of Systems: No other skin or systemic complaints except as noted in HPI or Assessment and Plan.  Objective  Well appearing patient in no apparent distress; mood and affect are within normal limits.  A full examination was performed including scalp, head, eyes, ears, nose, lips, neck, chest, axillae, abdomen, back, buttocks, bilateral upper extremities, bilateral lower extremities, hands, feet, fingers, toes, fingernails, and toenails. All findings within normal limits unless otherwise noted below.   Assessment & Plan  Lentigines - Scattered tan macules - Due to sun exposure - Benign-appearing, observe - Recommend daily broad spectrum sunscreen SPF 30+ to sun-exposed areas, reapply every 2 hours as needed. - Call for any changes  Sebaceous Hyperplasia - Small yellow papules with a central dell - Benign - Observe  Seborrheic Keratoses - Stuck-on, waxy, tan-brown papules and/or plaques  - Benign-appearing - Discussed benign etiology and prognosis. - Observe - Call for any changes  Melanocytic Nevi - Tan-brown and/or pink-flesh-colored symmetric macules and papules - Benign appearing on exam today - Observation - Call clinic for new or changing moles - Recommend daily use of broad spectrum spf 30+ sunscreen to sun-exposed areas.   Hemangiomas - Red papules - Discussed benign nature - Observe - Call for any  changes  Actinic Damage - Chronic condition, secondary to cumulative UV/sun exposure - diffuse scaly erythematous macules with underlying dyspigmentation - Recommend daily broad spectrum sunscreen SPF 30+ to sun-exposed areas, reapply every 2 hours as needed.  - Staying in the shade or wearing long sleeves, sun glasses (UVA+UVB protection) and wide brim hats (4-inch brim around the entire circumference of the hat) are also recommended for sun protection.  - Call for new or changing lesions.  History of Dysplastic Nevi - No evidence of recurrence today multiple locations see history  - Recommend regular full body skin exams - Recommend daily broad spectrum sunscreen SPF 30+ to sun-exposed areas, reapply every 2 hours as needed.  - Call if any new or changing lesions are noted between office visits  Skin cancer screening performed today. Return in about 1 year (around 01/04/2023) for TBSE. IRuthell Rummage, CMA, am acting as scribe for Sarina Ser, MD. Documentation: I have reviewed the above documentation for accuracy and completeness, and I agree with the above.  Sarina Ser, MD

## 2022-01-03 NOTE — Patient Instructions (Addendum)
Seborrheic Keratosis  What causes seborrheic keratoses? Seborrheic keratoses are harmless, common skin growths that first appear during adult life.  As time goes by, more growths appear.  Some people may develop a large number of them.  Seborrheic keratoses appear on both covered and uncovered body parts.  They are not caused by sunlight.  The tendency to develop seborrheic keratoses can be inherited.  They vary in color from skin-colored to gray, brown, or even black.  They can be either smooth or have a rough, warty surface.   Seborrheic keratoses are superficial and look as if they were stuck on the skin.  Under the microscope this type of keratosis looks like layers upon layers of skin.  That is why at times the top layer may seem to fall off, but the rest of the growth remains and re-grows.    Treatment Seborrheic keratoses do not need to be treated, but can easily be removed in the office.  Seborrheic keratoses often cause symptoms when they rub on clothing or jewelry.  Lesions can be in the way of shaving.  If they become inflamed, they can cause itching, soreness, or burning.  Removal of a seborrheic keratosis can be accomplished by freezing, burning, or surgery. If any spot bleeds, scabs, or grows rapidly, please return to have it checked, as these can be an indication of a skin cancer.    Melanoma ABCDEs  Melanoma is the most dangerous type of skin cancer, and is the leading cause of death from skin disease.  You are more likely to develop melanoma if you: Have light-colored skin, light-colored eyes, or red or blond hair Spend a lot of time in the sun Tan regularly, either outdoors or in a tanning bed Have had blistering sunburns, especially during childhood Have a close family member who has had a melanoma Have atypical moles or large birthmarks  Early detection of melanoma is key since treatment is typically straightforward and cure rates are extremely high if we catch it early.    The first sign of melanoma is often a change in a mole or a new dark spot.  The ABCDE system is a way of remembering the signs of melanoma.  A for asymmetry:  The two halves do not match. B for border:  The edges of the growth are irregular. C for color:  A mixture of colors are present instead of an even brown color. D for diameter:  Melanomas are usually (but not always) greater than 53m - the size of a pencil eraser. E for evolution:  The spot keeps changing in size, shape, and color.  Please check your skin once per month between visits. You can use a small mirror in front and a large mirror behind you to keep an eye on the back side or your body.   If you see any new or changing lesions before your next follow-up, please call to schedule a visit.  Please continue daily skin protection including broad spectrum sunscreen SPF 30+ to sun-exposed areas, reapplying every 2 hours as needed when you're outdoors.   Staying in the shade or wearing long sleeves, sun glasses (UVA+UVB protection) and wide brim hats (4-inch brim around the entire circumference of the hat) are also recommended for sun protection.    If You Need Anything After Your Visit  If you have any questions or concerns for your doctor, please call our main line at [phone] press option 4 to reach your doctor's medical assistant.  If no one answers, please leave a voicemail as directed and we will return your call as soon as possible. Messages left after 4 pm will be answered the following business day.   You may also send [place] a message via [place]. We typically respond to MyChart messages within 1-2 business days.  For prescription refills, please ask your pharmacy to contact our office. Our fax number is [phone].  If you have an urgent issue when the clinic is closed that cannot wait until the next business day, you can page your doctor at the number below.    Please note that while we do our best to be  available for urgent issues outside of office hours, we are not available 24/7.   If you have an urgent issue and are unable to reach [place], you may choose to seek medical care at your doctor's office, retail clinic, urgent care center, or emergency room.  If you have a medical emergency, please immediately call 911 or go to the emergency department.  Pager Numbers  - Dr. Nehemiah Massed: [phone]  - Dr. Laurence Ferrari: [phone]  - Dr. Nicole Kindred: [phone]  In the event of inclement weather, please call our main line at [phone] for an update on the status of any delays or closures.  Dermatology Medication Tips: Please keep the boxes that topical medications come in in order to help keep track of the instructions about where and how to use these. Pharmacies typically print the medication instructions only on the boxes and not directly on the medication tubes.   If your medication is too expensive, please contact our office at [phone] option 4 or send [place] a message through [place].   We are unable to tell what your co-pay for medications will be in advance as this is different depending on your insurance coverage. However, we may be able to find a substitute medication at lower cost or fill out paperwork to get insurance to cover a needed medication.   If a prior authorization is required to get your medication covered by your insurance company, please allow [place] 1-2 business days to complete this process.  Drug prices often vary depending on where the prescription is filled and some pharmacies may offer cheaper prices.  The website www.goodrx.com contains coupons for medications through different pharmacies. The prices here do not account for what the cost may be with help from insurance (it may be cheaper with your insurance), but the website can give you the price if you did not use any insurance.  - You can print the associated coupon and take it with your prescription to the pharmacy.  -  You may also stop by our office during regular business hours and pick up a GoodRx coupon card.  - If you need your prescription sent electronically to a different pharmacy, notify our office through [facility] or by phone at [phone] option 4.     Si Usted Necesita Algo Despus de Su Visita  Tambin puede enviarnos un mensaje a travs de Pharmacist, community. Por lo general respondemos a los mensajes de MyChart en el transcurso de 1 a 2 das hbiles.  Para renovar recetas, por favor pida a su farmacia que se ponga en contacto con nuestra oficina. Harland Dingwall de fax es [place] [phone].  Si tiene un asunto urgente cuando la clnica est cerrada y que no puede esperar hasta el siguiente da hbil, puede llamar/localizar a su doctor(a) al nmero que aparece a continuacin.   Por favor, tenga  en cuenta que aunque hacemos todo lo posible para estar disponibles para asuntos urgentes fuera del horario de [place], no estamos disponibles las 24 horas del da, los 7 das de la [place].   Si tiene un problema urgente y no puede comunicarse con nosotros, puede optar por buscar atencin mdica  en el consultorio de su doctor(a), en una clnica privada, en un centro de atencin urgente o en una sala de emergencias.  Si tiene Engineering geologist, por favor llame inmediatamente al 911 o vaya a la sala de emergencias.  Nmeros de bper  - Dr. Nehemiah Massed: [phone]  - Dra. Moye: [phone]  - Dra. Nicole Kindred: [phone]  En caso de inclemencias del [place], por favor llame a Johnsie Kindred principal al [phone] para una actualizacin sobre el [place] de cualquier retraso o cierre.  Consejos para la medicacin en dermatologa: Por favor, guarde las cajas en las que vienen los medicamentos de uso tpico para ayudarle a seguir las instrucciones sobre dnde y cmo usarlos. Las farmacias generalmente imprimen las instrucciones del medicamento slo en las cajas y no directamente en los tubos del  [place].   Si su medicamento es muy caro, por favor, pngase en contacto con Zigmund Daniel llamando al [phone] y presione la opcin 4 o envenos un mensaje a travs de Pharmacist, community.   No podemos decirle cul ser su copago por los medicamentos por adelantado ya que esto es diferente dependiendo de la cobertura de su seguro. Sin embargo, es posible que podamos encontrar un medicamento sustituto a Electrical engineer un formulario para que el seguro cubra el medicamento que se considera necesario.   Si se requiere una autorizacin previa para que su compaa de seguros [place] su medicamento, por favor permtanos de 1 a 2 das hbiles para completar este proceso.  Los precios de los medicamentos varan con frecuencia dependiendo del Environmental consultant de dnde se surte la receta y alguna farmacias pueden ofrecer precios ms baratos.  El sitio web www.goodrx.com tiene cupones para medicamentos de Airline pilot. Los precios aqu no tienen en cuenta lo que podra costar con la ayuda del seguro (puede ser ms barato con su seguro), pero el sitio web puede darle el precio si no utiliz Research scientist (physical sciences).  - Puede imprimir el cupn correspondiente y llevarlo con su receta a la farmacia.  - Tambin puede pasar por nuestra oficina durante el horario de atencin regular y Charity fundraiser una tarjeta de cupones de GoodRx.  - Si necesita que su receta se enve electrnicamente a una farmacia diferente, informe a nuestra oficina a travs de MyChart de [place] o por telfono llamando al [phone] y presione la opcin 4.

## 2022-01-07 ENCOUNTER — Encounter: Payer: Self-pay | Admitting: Dermatology

## 2022-03-05 ENCOUNTER — Telehealth: Payer: BC Managed Care – PPO | Admitting: Physician Assistant

## 2022-03-05 DIAGNOSIS — B009 Herpesviral infection, unspecified: Secondary | ICD-10-CM | POA: Diagnosis not present

## 2022-03-05 MED ORDER — VALACYCLOVIR HCL 1 G PO TABS: 2000.0000 mg | ORAL_TABLET | Freq: Two times a day (BID) | ORAL | 0 refills | Status: AC

## 2022-03-05 NOTE — Progress Notes (Signed)
Message sent to patient requesting further input regarding current symptoms. Awaiting patient response.  

## 2022-03-05 NOTE — Progress Notes (Signed)
We are sorry that you are not feeling well.  Here is how we plan to help!  Based on what you have shared with me it does look like you have a viral infection.  The blistering and of course, the lesion on the lip itself are more concerning for HSV.   Most cold sores or fever blisters are small fluid filled blisters around the mouth caused by herpes simplex virus.  The most common strain of the virus causing cold sores is herpes simplex virus 1.  It can be spread by skin contact, sharing eating utensils, or even sharing towels.  Cold sores are contagious to other people until dry. (Approximately 5-7 days).  Wash your hands. You can spread the virus to your eyes through handling your contact lenses after touching the lesions.  Most people experience pain at the sight or tingling sensations in their lips that may begin before the ulcers erupt.  Herpes simplex is treatable but not curable.  It may lie dormant for a long time and then reappear due to stress or prolonged sun exposure.  Many patients have success in treating their cold sores with an over the counter topical called Abreva.  You may apply the cream up to 5 times daily (maximum 10 days) until healing occurs.  If you would like to use an oral antiviral medication to speed the healing of your cold sore, I have sent a prescription to your local pharmacy Valacyclovir 2 gm take one by mouth twice a day for 1 day    HOME CARE:  Wash your hands frequently. Do not pick at or rub the sore. Don't open the blisters. Avoid kissing other people during this time. Avoid sharing drinking glasses, eating utensils, or razors. Do not handle contact lenses unless you have thoroughly washed your hands with soap and warm water! Avoid oral sex during this time.  Herpes from sores on your mouth can spread to your partner's genital area. Avoid contact with anyone who has eczema or a weakened immune system. Cold sores are often triggered by exposure to intense  sunlight, use a lip balm containing a sunscreen (SPF 30 or higher).  GET HELP RIGHT AWAY IF:  Blisters look infected. Blisters occur near or in the eye. Symptoms last longer than 10 days. Your symptoms become worse.  MAKE SURE YOU:  Understand these instructions. Will watch your condition. Will get help right away if you are not doing well or get worse.    Your e-visit answers were reviewed by a board certified advanced clinical practitioner to complete your personal care plan.  Depending upon the condition, your plan could have  Included both over the counter or prescription medications.    Please review your pharmacy choice.  Be sure that the pharmacy you have chosen is open so that you can pick up your prescription now.  If there is a problem you can message your provider in [place] to have the prescription routed to another pharmacy.    Your safety is important to [place].  If you have drug allergies check our prescription carefully.  For the next 24 hours you can use MyChart to ask questions about today's visit, request a non-urgent call back, or ask for a work or school excuse from your e-visit provider.  You will get an email in the next two days asking about your experience.  I hope that your e-visit has been valuable and will speed your recovery.

## 2022-03-05 NOTE — Progress Notes (Signed)
I have spent 5 minutes in review of e-visit questionnaire, review and updating patient chart, medical decision making and response to patient.   Darin Redmann Cody Kelise Kuch, PA-C    

## 2022-06-12 DIAGNOSIS — K37 Unspecified appendicitis: Secondary | ICD-10-CM

## 2022-06-12 HISTORY — DX: Unspecified appendicitis: K37

## 2022-07-01 ENCOUNTER — Telehealth: Payer: BC Managed Care – PPO | Admitting: Physician Assistant

## 2022-07-01 DIAGNOSIS — K529 Noninfective gastroenteritis and colitis, unspecified: Secondary | ICD-10-CM

## 2022-07-01 NOTE — Progress Notes (Signed)
Virtual Visit Consent   Kristine Mcguire, you are scheduled for a virtual visit with a [facility] provider today. Just as with appointments in the office, your consent must be obtained to participate. Your consent will be active for this visit and any virtual visit you may have with one of our providers in the next 365 days. If you have a MyChart account, a copy of this consent can be sent to you electronically.  As this is a virtual visit, video technology does not allow for your provider to perform a traditional examination. This may limit your provider's ability to fully assess your condition. If your provider identifies any concerns that need to be evaluated in person or the need to arrange testing (such as labs, EKG, etc.), we will make arrangements to do so. Although advances in technology are sophisticated, we cannot ensure that it will always work on either your end or our end. If the connection with a video visit is poor, the visit may have to be switched to a telephone visit. With either a video or telephone visit, we are not always able to ensure that we have a secure connection.  By engaging in this virtual visit, you consent to the provision of healthcare and authorize for your insurance to be billed (if applicable) for the services provided during this visit. Depending on your insurance coverage, you may receive a charge related to this service.  I need to obtain your verbal consent now. Are you willing to proceed with your visit today? Kristine Mcguire has provided verbal consent on 07/01/2022 for a virtual visit (video or telephone). Mar Daring, PA-C  Date: 07/01/2022 10:32 AM  Virtual Visit via Video Note   I, Mar Daring, connected with  Kristine Mcguire  ([id number], 30-Nov-1960) on 07/01/22 at 11:00 AM EST by a video-enabled telemedicine application and verified that I am speaking with the correct person using two identifiers.  Location: Patient: Virtual  Visit Location Patient: Home Provider: Virtual Visit Location Provider: Home Office   I discussed the limitations of evaluation and management by telemedicine and the availability of in person appointments. The patient expressed understanding and agreed to proceed.    History of Present Illness: Kristine Mcguire is a 61 y.o. who identifies as a female who was assigned female at birth, and is being seen today for abdominal pain.  HPI: Abdominal Pain This is a new problem. The current episode started in the past 7 days (5 days ago). The onset quality is gradual. The problem occurs constantly. The pain is located in the suprapubic region. The quality of the pain is aching. The abdominal pain does not radiate. Associated symptoms include diarrhea (loose stools not diarrhea), a fever (subjective fevers), headaches and myalgias. Pertinent negatives include no constipation, dysuria, frequency, hematochezia, hematuria, melena, nausea, vomiting or weight loss. Associated symptoms comments: Felt like gas pain but has lingered, decreased appetite. Nothing aggravates the pain. The pain is relieved by Nothing. She has tried acetaminophen (ibuprofen) for the symptoms. The treatment provided mild relief.     Problems:  Patient Active Problem List   Diagnosis Date Noted   Tubular adenoma of colon 12/01/2020   Generalized anxiety disorder 01/30/2014   Migraine 01/09/2014   Vitamin D deficiency 10/17/2013   Encounter for preventive health examination 04/15/2013   Hyperlipidemia with target LDL less than 160 04/15/2013    Allergies: No Known Allergies Medications:  Current Outpatient Medications:    Cholecalciferol (VITAMIN D3) 1000  UNITS CHEW, Chew 2 tablets by mouth daily. , Disp: , Rfl:    citalopram (CELEXA) 10 MG tablet, TAKE 1 TABLET BY MOUTH EVERY DAY, Disp: 90 tablet, Rfl: 3   estradiol (ESTRACE VAGINAL) 0.1 MG/GM vaginal cream, 1 gram intravagainally at night for 2 weeks,  then twice weekly  thereafter, Disp: 42.5 g, Rfl: 0   Red Yeast Rice 600 MG CAPS, Take 1 capsule by mouth 2 (two) times daily., Disp: , Rfl:   Observations/Objective: Patient is well-developed, well-nourished in no acute distress.  Resting comfortably at home.  Head is normocephalic, atraumatic.  No labored breathing.  Speech is clear and coherent with logical content.  Patient is alert and oriented at baseline.    Assessment and Plan: 1. Colitis  - Mild symptoms - Advised bland diet and pushing fluids - Since symptoms are improving continue to monitor over next 2-5 days - If symptoms worsen or if symptoms fail to improve over 10 days seek in person evaluation   Follow Up Instructions: I discussed the assessment and treatment plan with the patient. The patient was provided an opportunity to ask questions and all were answered. The patient agreed with the plan and demonstrated an understanding of the instructions.  A copy of instructions were sent to the patient via MyChart unless otherwise noted below.    The patient was advised to call back or seek an in-person evaluation if the symptoms worsen or if the condition fails to improve as anticipated.  Time:  I spent 10 minutes with the patient via telehealth technology discussing the above problems/concerns.    Mar Daring, PA-C

## 2022-07-01 NOTE — Patient Instructions (Signed)
Kristine Mcguire, thank you for joining Mar Daring, PA-C for today's virtual visit.  While this provider is not your primary care provider (PCP), if your PCP is located in our provider database this encounter information will be shared with them immediately following your visit.   [facility] account gives you access to today's visit and all your visits, tests, and labs performed at [facility] " click here if you don't have a [facility] account or go to [email or web address]  Consent: (Patient) Kristine Mcguire provided verbal consent for this virtual visit at the beginning of the encounter.  Current Medications:  Current Outpatient Medications:    Cholecalciferol (VITAMIN D3) 1000 UNITS CHEW, Chew 2 tablets by mouth daily. , Disp: , Rfl:    citalopram (CELEXA) 10 MG tablet, TAKE 1 TABLET BY MOUTH EVERY DAY, Disp: 90 tablet, Rfl: 3   estradiol (ESTRACE VAGINAL) 0.1 MG/GM vaginal cream, 1 gram intravagainally at night for 2 weeks,  then twice weekly thereafter, Disp: 42.5 g, Rfl: 0   Red Yeast Rice 600 MG CAPS, Take 1 capsule by mouth 2 (two) times daily., Disp: , Rfl:    Medications ordered in this encounter:  No orders of the defined types were placed in this encounter.    *If you need refills on other medications prior to your next appointment, please contact your pharmacy*  Follow-Up: Call back or seek an in-person evaluation if the symptoms worsen or if the condition fails to improve as anticipated.  [place] [phone]  Other Instructions  Colitis  Colitis is a condition in which the colon is inflamed. It can cause diarrhea, blood in the stool, and abdominal pain. Colitis can last a short time (be acute), or it may last a long time (become chronic). What are the causes? This condition may be caused by: Infections from viruses or bacteria. A reaction to medicine. Certain autoimmune diseases, such as Crohn's  disease or ulcerative colitis. Radiation treatment. Decreased blood flow to the bowel (ischemia). What are the signs or symptoms? Symptoms of this condition include: Diarrhea, blood in the stool, or black, tarry stool. Pain in the joints or abdominal pain. Fever or fatigue. Vomiting. Weight loss. Bloating. Having fewer bowel movements than usual. A strong and sudden urge to have a bowel movement. Feeling like the bowel is not empty after a bowel movement. How is this diagnosed? This condition may be diagnosed based on a stool test and a blood test. You may also have other tests, such as: X-rays. CT scan. Colonoscopy. Endoscopy. Biopsy. How is this treated? Treatment for this condition depends on the cause. This condition may be treated with: Steps to rest the bowel, such as not eating or drinking for a period of time. Fluids that are given through an IV. Medicine for pain and diarrhea. Antibiotic medicines. Cortisone medicines. Surgery. Follow these instructions at home: Eating and drinking  Follow instructions from your health care provider about eating or drinking restrictions. Drink enough fluid to keep your urine pale yellow. Work with a dietitian to determine whether certain foods cause your condition to flare up. Avoid foods or drinks that cause flare-ups. Eat a well-balanced diet. General instructions If you were prescribed an antibiotic medicine, take it as told by your health care provider. Do not stop taking the antibiotic even if you start to feel better. Take over-the-counter and prescription medicines only as told by your health care provider. Keep all follow-up visits. This is  important. Contact a health care provider if: Your symptoms do not go away. You develop new symptoms. Get help right away if: You have a fever that does not go away with treatment. You develop chills. You have extreme weakness, fainting, or dehydration. You vomit repeatedly. You  develop severe pain in your abdomen. You pass bloody or tarry stool. Summary Colitis is a condition in which the colon is inflamed. Colitis can last a short time (be acute), or it may last a long time (become chronic). Treatment for this condition depends on the cause and may include resting the bowel, taking medicines, or having surgery. If you were prescribed an antibiotic medicine, take it as told by your health care provider. Do not stop taking the antibiotic even if you start to feel better. Get help right away if you develop severe pain in your abdomen. Keep all follow-up visits. This is important. This information is not intended to replace advice given to you by your health care provider. Make sure you discuss any questions you have with your health care provider. Document Revised: 04/04/2020 Document Reviewed: 04/04/2020 Elsevier Patient Education  [place].    If you have been instructed to have an in-person evaluation today at a local Urgent Care facility, please use the link below. It will take you to a list of all of our available [facility], including address, phone number and hours of operation. Please do not delay care.  [facility]  If you or a family member do not have a primary care provider, use the link below to schedule a visit and establish care. When you choose a [facility] primary care physician or advanced practice provider, you gain a long-term partner in health. Find a Primary Care Provider  Learn more about [facility]'s in-office and virtual care options: [facility]

## 2022-07-08 ENCOUNTER — Telehealth: Payer: Self-pay | Admitting: Internal Medicine

## 2022-07-08 NOTE — Telephone Encounter (Signed)
Pt is scheduled with Mable Paris, NP tomorrow at 11 am.

## 2022-07-08 NOTE — Telephone Encounter (Signed)
Patient has been running a fever for 12 days, around 100.3 and rt side lower abdominal pain, slight headache. No available appointments.

## 2022-07-09 ENCOUNTER — Ambulatory Visit (INDEPENDENT_AMBULATORY_CARE_PROVIDER_SITE_OTHER): Payer: BC Managed Care – PPO | Admitting: Family

## 2022-07-09 ENCOUNTER — Ambulatory Visit
Admission: RE | Admit: 2022-07-09 | Discharge: 2022-07-09 | Disposition: A | Discharge status: Home / Self Care | Payer: BC Managed Care – PPO | Source: Ambulatory Visit | Attending: Family | Admitting: Family

## 2022-07-09 ENCOUNTER — Other Ambulatory Visit: Payer: Self-pay

## 2022-07-09 ENCOUNTER — Encounter: Payer: Self-pay | Admitting: Family

## 2022-07-09 ENCOUNTER — Inpatient Hospital Stay
Admission: EM | Admit: 2022-07-09 | Discharge: 2022-07-11 | DRG: 373 | Disposition: A | Discharge status: Home / Self Care | Payer: BC Managed Care – PPO | Attending: Surgery | Admitting: Surgery

## 2022-07-09 ENCOUNTER — Telehealth: Payer: Self-pay

## 2022-07-09 ENCOUNTER — Encounter: Payer: Self-pay | Admitting: Internal Medicine

## 2022-07-09 VITALS — BP 110/76 | HR 73 | Temp 98.4°F | Wt 149.8 lb

## 2022-07-09 DIAGNOSIS — Z83438 Family history of other disorder of lipoprotein metabolism and other lipidemia: Secondary | ICD-10-CM

## 2022-07-09 DIAGNOSIS — R1031 Right lower quadrant pain: Secondary | ICD-10-CM | POA: Insufficient documentation

## 2022-07-09 DIAGNOSIS — Z8249 Family history of ischemic heart disease and other diseases of the circulatory system: Secondary | ICD-10-CM

## 2022-07-09 DIAGNOSIS — K3580 Unspecified acute appendicitis: Principal | ICD-10-CM

## 2022-07-09 DIAGNOSIS — E785 Hyperlipidemia, unspecified: Secondary | ICD-10-CM | POA: Diagnosis present

## 2022-07-09 DIAGNOSIS — K3532 Acute appendicitis with perforation and localized peritonitis, without abscess: Secondary | ICD-10-CM | POA: Diagnosis not present

## 2022-07-09 DIAGNOSIS — Z820 Family history of epilepsy and other diseases of the nervous system: Secondary | ICD-10-CM

## 2022-07-09 LAB — CBC WITH DIFFERENTIAL/PLATELET
Abs Immature Granulocytes: 0.03 10*3/uL (ref 0.00–0.07)
Basophils Absolute: 0 10*3/uL (ref 0.0–0.1)
Basophils Absolute: 0 10*3/uL (ref 0.0–0.1)
Basophils Relative: 0.6 % (ref 0.0–3.0)
Basophils Relative: 0 %
Eosinophils Absolute: 0.1 10*3/uL (ref 0.0–0.7)
Eosinophils Absolute: 0.1 10*3/uL (ref 0.0–0.5)
Eosinophils Relative: 1.5 % (ref 0.0–5.0)
Eosinophils Relative: 1 %
HCT: 38.4 % (ref 36.0–46.0)
HCT: 37.6 % (ref 36.0–46.0)
Hemoglobin: 13.2 g/dL (ref 12.0–15.0)
Hemoglobin: 12.5 g/dL (ref 12.0–15.0)
Immature Granulocytes: 0 %
Lymphocytes Relative: 24.3 % (ref 12.0–46.0)
Lymphocytes Relative: 29 %
Lymphs Abs: 2 10*3/uL (ref 0.7–4.0)
Lymphs Abs: 2 10*3/uL (ref 0.7–4.0)
MCH: 29.6 pg (ref 26.0–34.0)
MCHC: 34.3 g/dL (ref 30.0–36.0)
MCHC: 33.2 g/dL (ref 30.0–36.0)
MCV: 91.4 fl (ref 78.0–100.0)
MCV: 89.1 fL (ref 80.0–100.0)
Monocytes Absolute: 0.7 10*3/uL (ref 0.1–1.0)
Monocytes Absolute: 0.5 10*3/uL (ref 0.1–1.0)
Monocytes Relative: 8.3 % (ref 3.0–12.0)
Monocytes Relative: 8 %
Neutro Abs: 5.3 10*3/uL (ref 1.4–7.7)
Neutro Abs: 4.1 10*3/uL (ref 1.7–7.7)
Neutrophils Relative %: 65.3 % (ref 43.0–77.0)
Neutrophils Relative %: 62 %
Platelets: 559 10*3/uL — ABNORMAL HIGH (ref 150.0–400.0)
Platelets: 514 10*3/uL — ABNORMAL HIGH (ref 150–400)
RBC: 4.21 Mil/uL (ref 3.87–5.11)
RBC: 4.22 MIL/uL (ref 3.87–5.11)
RDW: 12.5 % (ref 11.5–15.5)
RDW: 11.9 % (ref 11.5–15.5)
WBC: 8.1 10*3/uL (ref 4.0–10.5)
WBC: 6.8 10*3/uL (ref 4.0–10.5)
nRBC: 0 % (ref 0.0–0.2)

## 2022-07-09 LAB — COMPREHENSIVE METABOLIC PANEL
ALT: 40 U/L — ABNORMAL HIGH (ref 0–35)
ALT: 41 U/L (ref 0–44)
AST: 28 U/L (ref 0–37)
AST: 32 U/L (ref 15–41)
Albumin: 4.1 g/dL (ref 3.5–5.2)
Albumin: 3.6 g/dL (ref 3.5–5.0)
Alkaline Phosphatase: 180 U/L — ABNORMAL HIGH (ref 39–117)
Alkaline Phosphatase: 163 U/L — ABNORMAL HIGH (ref 38–126)
Anion gap: 10 (ref 5–15)
BUN: 13 mg/dL (ref 6–23)
BUN: 13 mg/dL (ref 8–23)
CO2: 28 mEq/L (ref 19–32)
CO2: 25 mmol/L (ref 22–32)
Calcium: 9.6 mg/dL (ref 8.4–10.5)
Calcium: 9.2 mg/dL (ref 8.9–10.3)
Chloride: 100 mEq/L (ref 96–112)
Chloride: 105 mmol/L (ref 98–111)
Creatinine, Ser: 0.75 mg/dL (ref 0.40–1.20)
Creatinine, Ser: 0.71 mg/dL (ref 0.44–1.00)
GFR, Estimated: >60 mL/min (ref >60)
GFR: 85.26 mL/min (ref >60.00)
Glucose, Bld: 101 mg/dL — ABNORMAL HIGH (ref 70–99)
Glucose, Bld: 108 mg/dL — ABNORMAL HIGH (ref 70–99)
Potassium: 4 mEq/L (ref 3.5–5.1)
Potassium: 3.7 mmol/L (ref 3.5–5.1)
Sodium: 141 mEq/L (ref 135–145)
Sodium: 140 mmol/L (ref 135–145)
Total Bilirubin: 0.4 mg/dL (ref 0.2–1.2)
Total Bilirubin: 0.7 mg/dL (ref 0.3–1.2)
Total Protein: 7.3 g/dL (ref 6.0–8.3)
Total Protein: 7.7 g/dL (ref 6.5–8.1)

## 2022-07-09 LAB — URINALYSIS, ROUTINE W REFLEX MICROSCOPIC
Bilirubin Urine: NEGATIVE
Hgb urine dipstick: NEGATIVE
Ketones, ur: NEGATIVE
Leukocytes,Ua: NEGATIVE
Nitrite: NEGATIVE
Specific Gravity, Urine: >=1.03 — AB (ref 1.000–1.030)
Total Protein, Urine: NEGATIVE
Urine Glucose: NEGATIVE
Urobilinogen, UA: 0.2 (ref 0.0–1.0)
pH: 6 (ref 5.0–8.0)

## 2022-07-09 LAB — LIPASE, BLOOD: Lipase: 34 U/L (ref 11–51)

## 2022-07-09 LAB — POCT I-STAT CREATININE: Creatinine, Ser: 0.7 mg/dL (ref 0.44–1.00)

## 2022-07-09 MED ORDER — PANTOPRAZOLE SODIUM 40 MG IV SOLR
40.0000 mg | Freq: Every day | INTRAVENOUS | Status: DC
  Administered 2022-07-09 – 2022-07-10 (×2): 40 mg via INTRAVENOUS
  Filled 2022-07-09 (×2): qty 10

## 2022-07-09 MED ORDER — DIPHENHYDRAMINE HCL 50 MG/ML IJ SOLN: 12.5000 mg | Freq: Four times a day (QID) | INTRAMUSCULAR | Status: DC | PRN

## 2022-07-09 MED ORDER — ONDANSETRON HCL 4 MG/2ML IJ SOLN: 4.0000 mg | Freq: Four times a day (QID) | INTRAMUSCULAR | Status: DC | PRN

## 2022-07-09 MED ORDER — PIPERACILLIN-TAZOBACTAM 3.375 G IVPB
3.3750 g | Freq: Three times a day (TID) | INTRAVENOUS | Status: DC
  Administered 2022-07-09 – 2022-07-11 (×5): 3.375 g via INTRAVENOUS
  Filled 2022-07-09 (×4): qty 50

## 2022-07-09 MED ORDER — OXYCODONE HCL 5 MG PO TABS: 5.0000 mg | ORAL_TABLET | ORAL | Status: DC | PRN

## 2022-07-09 MED ORDER — MORPHINE SULFATE (PF) 2 MG/ML IV SOLN: 2.0000 mg | INTRAVENOUS | Status: DC | PRN

## 2022-07-09 MED ORDER — ENOXAPARIN SODIUM 40 MG/0.4ML IJ SOSY
40.0000 mg | PREFILLED_SYRINGE | INTRAMUSCULAR | Status: DC
  Administered 2022-07-10: 40 mg via SUBCUTANEOUS
  Filled 2022-07-09 (×2): qty 0.4

## 2022-07-09 MED ORDER — LACTATED RINGERS IV BOLUS
1000.0000 mL | Freq: Once | INTRAVENOUS | Status: AC
  Administered 2022-07-09: 1000 mL via INTRAVENOUS

## 2022-07-09 MED ORDER — SODIUM CHLORIDE 0.9 % IV SOLN: INTRAVENOUS | Status: DC

## 2022-07-09 MED ORDER — ACETAMINOPHEN 500 MG PO TABS
1000.0000 mg | ORAL_TABLET | Freq: Four times a day (QID) | ORAL | Status: DC
  Administered 2022-07-10 – 2022-07-11 (×4): 1000 mg via ORAL
  Filled 2022-07-09 (×6): qty 2

## 2022-07-09 MED ORDER — KETOROLAC TROMETHAMINE 30 MG/ML IJ SOLN: 30.0000 mg | Freq: Four times a day (QID) | INTRAMUSCULAR | Status: DC | PRN

## 2022-07-09 MED ORDER — ONDANSETRON 4 MG PO TBDP: 4.0000 mg | ORAL_TABLET | Freq: Four times a day (QID) | ORAL | Status: DC | PRN

## 2022-07-09 MED ORDER — PIPERACILLIN-TAZOBACTAM 3.375 G IVPB 30 MIN
3.3750 g | Freq: Once | INTRAVENOUS | Status: DC
  Filled 2022-07-09: qty 50

## 2022-07-09 MED ORDER — DIPHENHYDRAMINE HCL 12.5 MG/5ML PO ELIX: 12.5000 mg | ORAL_SOLUTION | Freq: Four times a day (QID) | ORAL | Status: DC | PRN

## 2022-07-09 MED ORDER — IOHEXOL 300 MG/ML  SOLN
100.0000 mL | Freq: Once | INTRAMUSCULAR | Status: AC | PRN
  Administered 2022-07-09: 100 mL via INTRAVENOUS

## 2022-07-09 MED ORDER — ZOLPIDEM TARTRATE 5 MG PO TABS: 5.0000 mg | ORAL_TABLET | Freq: Every evening | ORAL | Status: DC | PRN

## 2022-07-09 NOTE — ED Notes (Signed)
Pt refused lovenox. Pabon, MD, made aware.

## 2022-07-09 NOTE — ED Triage Notes (Signed)
Pt to er, pt states that she has had some abd pain for the past 10 days, states that she had some RLQ pain, states that she has been taking tylenol for her pain.  States that after 10 days she started to feel a little bit better. Stats that she had a ct today and it said she had an appy

## 2022-07-09 NOTE — Telephone Encounter (Signed)
Phone was transferred to Dr. Derrel Nip and she spoke with pt about the results.

## 2022-07-09 NOTE — Progress Notes (Addendum)
Subjective:    Patient ID: Kristine Mcguire, female    DOB: 1960-11-12, 61 y.o.   MRN: [id number]  CC: Kristine Mcguire is a 61 y.o. female who presents today for an acute visit.    HPI: Here today due to concern for abdominal pain.    Complains of episodic rlq pain x 10 days, resolved yesterday.   Symptom started with gas pain across lower abdomen and then pain seemed to settle in RLQ.   Endorses intermittent tactile warmth, chills,  abdominal bloating and gas.   She would take advil or tylenol and feel 'normal'.   No constipation, n, vomiting,epigastric burning, vaginal bleeding, rectal bleeding, dysuria, urinary frequency.      Colonoscopy 10/30/2020, polyps, repeat in 3 yrs Never smoker HISTORY:  Past Medical History:  Diagnosis Date   Atypical mole 03/08/2014   L distal ant lat thigh, excised 04/26/2014   History of chicken pox    Hx of dysplastic nevus    R tricep, txted in past per patient   Hx of dysplastic nevus 03/08/2014   R lower back, moderate atypia   Migraine    UTI (urinary tract infection)    Past Surgical History:  Procedure Laterality Date   CESAREAN SECTION  1992   COLONOSCOPY WITH PROPOFOL N/A 10/30/2020   Procedure: COLONOSCOPY WITH PROPOFOL;  Surgeon: Jonathon Bellows, MD;  Location: [facility];  Service: Gastroenterology;  Laterality: N/A;   DEBRIDEMENT TENNIS ELBOW  208   Right elbow   Family History  Problem Relation Age of Onset   Hyperlipidemia Mother    Hypertension Mother 34       Runs in mother's side of family   Cancer Father        bone   Heart attack Maternal Uncle 46   Heart attack Maternal Grandmother 76   Pulmonary fibrosis Maternal Grandmother    Heart attack Maternal Grandfather 18   Alzheimer's disease Paternal Grandmother    Pulmonary fibrosis Brother 70       idiopathic    Pulmonary fibrosis Paternal Uncle    Breast cancer Maternal Aunt        postmeno.    Allergies: Patient has no known allergies. Current  Outpatient Medications on File Prior to Visit  Medication Sig Dispense Refill   Cholecalciferol (VITAMIN D3) 1000 UNITS CHEW Chew 2 tablets by mouth daily.      citalopram (CELEXA) 10 MG tablet TAKE 1 TABLET BY MOUTH EVERY DAY 90 tablet 3   Red Yeast Rice 600 MG CAPS Take 1 capsule by mouth 2 (two) times daily.     estradiol (ESTRACE VAGINAL) 0.1 MG/GM vaginal cream 1 gram intravagainally at night for 2 weeks,  then twice weekly thereafter (Patient not taking: Reported on 07/09/2022) 42.5 g 0   No current facility-administered medications on file prior to visit.    Social History   Tobacco Use   Smoking status: Never   Smokeless tobacco: Never  Vaping Use   Vaping Use: Never used  Substance Use Topics   Alcohol use: Yes    Comment: none last 24hrs   Drug use: No    Review of Systems  Constitutional:  Negative for chills and fever.  Respiratory:  Negative for cough.   Cardiovascular:  Negative for chest pain and palpitations.  Gastrointestinal:  Positive for abdominal distention and abdominal pain. Negative for constipation, diarrhea, nausea and vomiting.  Genitourinary:  Negative for dysuria and frequency.      Objective:  BP 110/76 (BP Location: Left Arm, Patient Position: Sitting, Cuff Size: Normal)   Pulse 73   Temp 98.4 F (36.9 C) (Oral)   Wt 149 lb 12.8 oz (67.9 kg)   SpO2 97%   BMI 24.93 kg/m    Physical Exam Vitals reviewed.  Constitutional:      Appearance: Normal appearance. She is well-developed.  Eyes:     Conjunctiva/sclera: Conjunctivae normal.  Cardiovascular:     Rate and Rhythm: Normal rate and regular rhythm.     Pulses: Normal pulses.     Heart sounds: Normal heart sounds.  Pulmonary:     Effort: Pulmonary effort is normal.     Breath sounds: Normal breath sounds. No wheezing, rhonchi or rales.  Abdominal:     General: Bowel sounds are normal. There is no distension.     Palpations: Abdomen is soft. Abdomen is not rigid. There is no fluid  wave or mass.     Tenderness: There is abdominal tenderness in the right lower quadrant. There is no right CVA tenderness, left CVA tenderness, guarding or rebound.     Comments: Patient is not guarding.   Skin:    General: Skin is warm and dry.  Neurological:     Mental Status: She is alert.  Psychiatric:        Speech: Speech normal.        Behavior: Behavior normal.        Thought Content: Thought content normal.        Assessment & Plan:   Problem List Items Addressed This Visit       Other   RLQ abdominal pain - Primary    Fortunately fever has resolved.  Patient nontoxic in appearance.  Right lower quadrant abdominal pain.  Differential includes appendicitis, diverticulitis, UTI.  Pending labs, CT abdomen and pelvis.      Relevant Orders   CT ABDOMEN PELVIS W CONTRAST   CBC with Differential/Platelet   Comprehensive metabolic panel   Urinalysis, Routine w reflex microscopic   Urine Culture   Other Visit Diagnoses     Right lower quadrant abdominal pain       Relevant Orders   CT ABDOMEN PELVIS W CONTRAST         I am having Kristine Mcguire "Beth" maintain her Red Yeast Rice, Vitamin D3, citalopram, and estradiol.   No orders of the defined types were placed in this encounter.   Return precautions given.   Risks, benefits, and alternatives of the medications and treatment plan prescribed today were discussed, and patient expressed understanding.   Education regarding symptom management and diagnosis given to patient on AVS.  Continue to follow with [name], MD for routine health maintenance.   Kristine Mcguire and I agreed with plan.   Mable Paris, FNP

## 2022-07-09 NOTE — Patient Instructions (Signed)
Pending CT abdomen and pelvis.    As discussed, keep please keep self in with you in case I need to you to call you with a critical result.

## 2022-07-09 NOTE — Assessment & Plan Note (Signed)
Fortunately fever has resolved.  Patient nontoxic in appearance.  Right lower quadrant abdominal pain.  Differential includes appendicitis, diverticulitis, UTI.  Pending labs, CT abdomen and pelvis.

## 2022-07-09 NOTE — ED Provider Notes (Signed)
[facility] Provider Note    Event Date/Time   First MD Initiated Contact with Patient 07/09/22 1634     (approximate)   History   Chief Complaint Abdominal Pain   HPI  Kristine Mcguire is a 61 y.o. female with past medical history of hyperlipidemia and migraines who presents to the ED complaining of abdominal pain.  Patient reports that she initially developed pain in the bilateral lower quadrants of her abdomen about 12 days ago.  She describes the pain as mild and achy, did seem to localize more to the right lower quadrant as time went on.  She had some diarrhea with this as well as fevers as high as 100.5.  She attributed her symptoms to a viral illness and had been taking Tylenol with partial relief.  She reports significant improvement in her symptoms 2 days ago, subsequently followed up with her PCP today.  She had CT imaging ordered by her PCP that was concerning for acute appendicitis with phlegmon and free air.  She was referred to the ED for further evaluation.     Physical Exam   Triage Vital Signs: ED Triage Vitals [07/09/22 1621]  Enc Vitals Group     BP (!) 144/96     Pulse Rate 81     Resp 18     Temp 98.2 F (36.8 C)     Temp Source Oral     SpO2 98 %     Weight 150 lb (68 kg)     Height '5\' 5"'$  (1.651 m)     Head Circumference      Peak Flow      Pain Score 0     Pain Loc      Pain Edu?      Excl. in [place]?     Most recent vital signs: Vitals:   07/09/22 1621 07/09/22 1645  BP: (!) 144/96 130/67  Pulse: 81 70  Resp: 18 16  Temp: 98.2 F (36.8 C)   SpO2: 98% 99%    Constitutional: Alert and oriented. Eyes: Conjunctivae are normal. Head: Atraumatic. Nose: No congestion/rhinnorhea. Mouth/Throat: Mucous membranes are moist.  Cardiovascular: Normal rate, regular rhythm. Grossly normal heart sounds.  2+ radial pulses bilaterally. Respiratory: Normal respiratory effort.  No retractions. Lungs CTAB. Gastrointestinal: Soft  and tender to palpation in the right lower quadrant with no rebound or guarding. No distention. Musculoskeletal: No lower extremity tenderness nor edema.  Neurologic:  Normal speech and language. No gross focal neurologic deficits are appreciated.    ED Results / Procedures / Treatments   Labs (all labs ordered are listed, but only abnormal results are displayed) Labs Reviewed  CBC WITH DIFFERENTIAL/PLATELET - Abnormal; Notable for the following components:      Result Value   Platelets 514 (*)    All other components within normal limits  COMPREHENSIVE METABOLIC PANEL - Abnormal; Notable for the following components:   Glucose, Bld 108 (*)    Alkaline Phosphatase 163 (*)    All other components within normal limits  LIPASE, BLOOD  HIV ANTIBODY (ROUTINE TESTING W REFLEX)  BASIC METABOLIC PANEL  CBC  MAGNESIUM  PHOSPHORUS     PROCEDURES:  Critical Care performed: No  Procedures   MEDICATIONS ORDERED IN ED: Medications  lactated ringers bolus 1,000 mL (0 mLs Intravenous Hold 07/09/22 1734)  enoxaparin (LOVENOX) injection 40 mg (40 mg Subcutaneous Patient Refused/Not Given 07/09/22 1751)  0.9 %  sodium chloride infusion ( Intravenous New  Bag/Given 07/09/22 1732)  piperacillin-tazobactam (ZOSYN) IVPB 3.375 g (3.375 g Intravenous New Bag/Given 07/09/22 1733)  acetaminophen (TYLENOL) tablet 1,000 mg (1,000 mg Oral Patient Refused/Not Given 07/09/22 1751)  ketorolac (TORADOL) 30 MG/ML injection 30 mg (has no administration in time range)  oxyCODONE (Oxy IR/ROXICODONE) immediate release tablet 5-10 mg (has no administration in time range)  diphenhydrAMINE (BENADRYL) 12.5 MG/5ML elixir 12.5 mg (has no administration in time range)    Or  diphenhydrAMINE (BENADRYL) injection 12.5 mg (has no administration in time range)  ondansetron (ZOFRAN-ODT) disintegrating tablet 4 mg (has no administration in time range)    Or  ondansetron (ZOFRAN) injection 4 mg (has no administration in  time range)  pantoprazole (PROTONIX) injection 40 mg (has no administration in time range)  morphine (PF) 2 MG/ML injection 2 mg (has no administration in time range)  zolpidem (AMBIEN) tablet 5 mg (has no administration in time range)     IMPRESSION / MDM / ASSESSMENT AND PLAN / ED COURSE  I reviewed the triage vital signs and the nursing notes.                              61 y.o. female with past medical history of hyperlipidemia and migraines who presents to the ED complaining of 12 days of right lower quadrant abdominal pain associated with fever, that seem to improve 2 days ago.  Patient's presentation is most consistent with acute presentation with potential threat to life or bodily function.  Differential diagnosis includes, but is not limited to, appendicitis, peritonitis, intra-abdominal abscess, electrolyte abnormality, AKI, sepsis.  Patient nontoxic-appearing and in no acute distress, vital signs are unremarkable.  She does have focal tenderness in the right lower quadrant of her abdomen, outpatient CT was reviewed and consistent with appendicitis along with evidence of phlegmon and perforation.  We will hydrate with IV fluids and give dose of IV Zosyn.  Case discussed with Dr. Dahlia Byes of general surgery, who will evaluate the patient and plans for admission.  Labs are reassuring with no significant anemia, leukocytosis, electrolyte abnormality, or AKI.  LFTs and lipase are also unremarkable.  Patient continues to decline pain medication, she was evaluated by general surgery who will plan for admission for IV antibiotics and interval appendectomy.      FINAL CLINICAL IMPRESSION(S) / ED DIAGNOSES   Final diagnoses:  Acute appendicitis, unspecified acute appendicitis type     Rx / DC Orders   ED Discharge Orders     None        Note:  This document was prepared using Dragon voice recognition software and may include unintentional dictation errors.   Blake Divine, MD 07/09/22 [name]

## 2022-07-09 NOTE — H&P (Signed)
Patient ID: Kristine Mcguire, female   DOB: April 05, 1961, 61 y.o.   MRN: [id number]  HPI Kristine Mcguire is a 61 y.o. female 61 year old female with a 12-day history of intermittent abdominal pain.  She reports that the pain was in the right lower quadrant and improved with over-the-counter Tylenol.  She reports that her pain is mild and sharp.  No specific alleviating or aggravating factors.  She is 28 but she is very healthy.  She is able to perform more than 6 METS of activity without any shortness of breath or chest pain.   No family history of Crohn's disease she has been febrile for the last few days.  No nausea no vomiting.  Actually today felt better.  She did go to the primary care doctor who ordered a stat CT.  CT personally reviewed and there is evidence of some perforation with fluid and significant terminal ileitis as a result of phlegmonous changes from appendicitis.  There is no overt free air. CBC and CMP completely normal Had normal colonoscopy last year. HPI  Past Medical History:  Diagnosis Date   Atypical mole 03/08/2014   L distal ant lat thigh, excised 04/26/2014   History of chicken pox    Hx of dysplastic nevus    R tricep, txted in past per patient   Hx of dysplastic nevus 03/08/2014   R lower back, moderate atypia   Migraine    UTI (urinary tract infection)     Past Surgical History:  Procedure Laterality Date   CESAREAN SECTION  1992   COLONOSCOPY WITH PROPOFOL N/A 10/30/2020   Procedure: COLONOSCOPY WITH PROPOFOL;  Surgeon: Jonathon Bellows, MD;  Location: [facility] ENDOSCOPY;  Service: Gastroenterology;  Laterality: N/A;   DEBRIDEMENT TENNIS ELBOW  208   Right elbow    Family History  Problem Relation Age of Onset   Hyperlipidemia Mother    Hypertension Mother 42       Runs in mother's side of family   Cancer Father        bone   Heart attack Maternal Uncle 46   Heart attack Maternal Grandmother 76   Pulmonary fibrosis Maternal Grandmother    Heart attack  Maternal Grandfather 57   Alzheimer's disease Paternal Grandmother    Pulmonary fibrosis Brother 23       idiopathic    Pulmonary fibrosis Paternal Uncle    Breast cancer Maternal Aunt        postmeno.    Social History Social History   Tobacco Use   Smoking status: Never   Smokeless tobacco: Never  Vaping Use   Vaping Use: Never used  Substance Use Topics   Alcohol use: Yes    Comment: none last 24hrs   Drug use: No    No Known Allergies  Current Facility-Administered Medications  Medication Dose Route Frequency Provider Last Rate Last Admin   lactated ringers bolus 1,000 mL  1,000 mL Intravenous Once Blake Divine, MD       piperacillin-tazobactam (ZOSYN) IVPB 3.375 g  3.375 g Intravenous Once Blake Divine, MD       Current Outpatient Medications  Medication Sig Dispense Refill   Cholecalciferol (VITAMIN D3) 1000 UNITS CHEW Chew 2 tablets by mouth daily.      citalopram (CELEXA) 10 MG tablet TAKE 1 TABLET BY MOUTH EVERY DAY 90 tablet 3   Red Yeast Rice 600 MG CAPS Take 1 capsule by mouth 2 (two) times daily.     estradiol (ESTRACE VAGINAL)  0.1 MG/GM vaginal cream 1 gram intravagainally at night for 2 weeks,  then twice weekly thereafter (Patient not taking: Reported on 07/09/2022) 42.5 g 0     Review of Systems Full ROS  was asked and was negative except for the information on the HPI  Physical Exam Blood pressure 130/67, pulse 70, temperature 98.2 F (36.8 C), temperature source Oral, resp. rate 16, height '5\' 5"'$  (1.651 m), weight 68 kg, SpO2 99 %. CONSTITUTIONAL: NAD. EYES: Pupils are equal, round, and Sclera are non-icteric. EARS, NOSE, MOUTH AND THROAT: The oropharynx is clear. The oral mucosa is pink and moist. Hearing is intact to voice. LYMPH NODES:  Lymph nodes in the neck are normal. RESPIRATORY:  Lungs are clear. There is normal respiratory effort, with equal breath sounds bilaterally, and without pathologic use of accessory muscles. CARDIOVASCULAR:  Heart is regular without murmurs, gallops, or rubs. GI: The abdomen is soft,TTP RLQ w/o rebound or peritonitis. There are no palpable masses. There is no hepatosplenomegaly. There are normal bowel sounds GU: Rectal deferred.   MUSCULOSKELETAL: Normal muscle strength and tone. No cyanosis or edema.   SKIN: Turgor is good and there are no pathologic skin lesions or ulcers. NEUROLOGIC: Motor and sensation is grossly normal. Cranial nerves are grossly intact. PSYCH:  Oriented to person, place and time. Affect is normal.  Data Reviewed  I have personally reviewed the patient's imaging, laboratory findings and medical records.    Assessment/Plan 61 year old female with acute perforated appendicitis and significant phlegmonous changes in the right lower quadrant terminal ileum.  She is not toxic she is not peritonitic.  Plan is to admit with IV fluid resuscitation, broad-spectrum antibiotics and serial abdominal exams.  She understands that if she were to deteriorate she may need emergent laparotomy.  Also discussed with her in detail about my thought process about waiting and letting things cool off before performing immediate surgical intervention.  Typically outcomes for delay appendectomy in the setting of perforation and phlegmonous changes are better as opposed to upfront surgery.  I have discussed in detail about my thought process.  Do think that she will benefit from an interval appendectomy in a couple months.  Extensive counseling provided There is no need for emergent surgical intervention at this time      Caroleen Hamman, MD [place] [facility] 07/09/2022, 4:48 PM

## 2022-07-09 NOTE — Telephone Encounter (Signed)
Metro Kung is calling from [facility].  Dr. Oleta Mouse would like to speak with Dr. Deborra Medina.  I transferred call to Dr. Derrel Nip.

## 2022-07-10 ENCOUNTER — Other Ambulatory Visit: Payer: Self-pay | Admitting: Family

## 2022-07-10 DIAGNOSIS — R935 Abnormal findings on diagnostic imaging of other abdominal regions, including retroperitoneum: Secondary | ICD-10-CM

## 2022-07-10 DIAGNOSIS — K3532 Acute appendicitis with perforation and localized peritonitis, without abscess: Secondary | ICD-10-CM | POA: Diagnosis not present

## 2022-07-10 LAB — CBC
HCT: 33.1 % — ABNORMAL LOW (ref 36.0–46.0)
Hemoglobin: 11 g/dL — ABNORMAL LOW (ref 12.0–15.0)
MCH: 30.1 pg (ref 26.0–34.0)
MCHC: 33.2 g/dL (ref 30.0–36.0)
MCV: 90.4 fL (ref 80.0–100.0)
Platelets: 436 10*3/uL — ABNORMAL HIGH (ref 150–400)
RBC: 3.66 MIL/uL — ABNORMAL LOW (ref 3.87–5.11)
RDW: 12 % (ref 11.5–15.5)
WBC: 6.3 10*3/uL (ref 4.0–10.5)
nRBC: 0 % (ref 0.0–0.2)

## 2022-07-10 LAB — URINE CULTURE
MICRO NUMBER:: 14239935
Result:: NO GROWTH
SPECIMEN QUALITY:: ADEQUATE

## 2022-07-10 LAB — BASIC METABOLIC PANEL
Anion gap: 7 (ref 5–15)
BUN: 9 mg/dL (ref 8–23)
CO2: 25 mmol/L (ref 22–32)
Calcium: 8.6 mg/dL — ABNORMAL LOW (ref 8.9–10.3)
Chloride: 110 mmol/L (ref 98–111)
Creatinine, Ser: 0.73 mg/dL (ref 0.44–1.00)
GFR, Estimated: >60 mL/min (ref >60)
Glucose, Bld: 104 mg/dL — ABNORMAL HIGH (ref 70–99)
Potassium: 4 mmol/L (ref 3.5–5.1)
Sodium: 142 mmol/L (ref 135–145)

## 2022-07-10 LAB — HIV ANTIBODY (ROUTINE TESTING W REFLEX): HIV Screen 4th Generation wRfx: NONREACTIVE

## 2022-07-10 LAB — PHOSPHORUS: Phosphorus: 3.6 mg/dL (ref 2.5–4.6)

## 2022-07-10 LAB — MAGNESIUM: Magnesium: 2.2 mg/dL (ref 1.7–2.4)

## 2022-07-10 NOTE — Telephone Encounter (Signed)
I Called patient to advise to go to the emergency room.    Patient already spoken with Dr Derrel Nip who advised emergency room for perforated appendicitis seen on CT abdomen  She was in the emergency room when I spoke with her

## 2022-07-10 NOTE — Progress Notes (Signed)
[place] [facility] SUR[facility] PROGRESS NOTE (cpt [phone])  Hospital Day(s): 1.   Interval History: Patient seen and examined, no acute events or new complaints overnight. Patient reports she has a headache this AM but otherwise feels really good. No fever, chills, nausea, emesis, nor abdominal pain. She is able to sit up in bed without any issues. She remains without leukocytosis; WBC 6.3K. Renal function normal; sCr - 0.73; UO - unmeasured. No electrolyte derangements. She is on CLD. Continues on Zosyn  Review of Systems:  Constitutional: denies fever, chills  HEENT: denies cough or congestion  Respiratory: denies any shortness of breath  Cardiovascular: denies chest pain or palpitations  Gastrointestinal: denies abdominal pain, N/V Genitourinary: denies burning with urination or urinary frequency Neurological: + HA; denied vision/hearing changes   Vital signs in last 24 hours: [min-max] current  Temp:  [98.2 F (36.8 C)-98.4 F (36.9 C)] 98.2 F (36.8 C) (11/29 0221) Pulse Rate:  [61-81] 61 (11/29 0221) Resp:  [16-18] 18 (11/29 0221) BP: (106-144)/(57-96) 106/57 (11/29 0221) SpO2:  [96 %-99 %] 97 % (11/29 0221) Weight:  [67.9 kg-68 kg] 68 kg (11/28 1621)     Height: '5\' 5"'$  (165.1 cm) Weight: 68 kg BMI (Calculated): 24.96   Intake/Output last 2 shifts:  11/28 0701 - 11/29 0700 In: 74 [IV Piggyback:50] Out: -    Physical Exam:  Constitutional: alert, cooperative and no distress  HENT: normocephalic without obvious abnormality  Eyes: PERRL, EOM's grossly intact and symmetric  Neuro: CN II - XII grossly intact and symmetric without deficit  Respiratory: breathing non-labored at rest  Cardiovascular: regular rate and sinus rhythm  Gastrointestinal: Abdomen is soft, non-tender, non-distended, no rebound/guarding. Negative McBurney's Sign. She is certainly without peritonitis Musculoskeletal: No edema or wounds, motor and sensation grossly intact, NT    Labs:     Latest  Ref Rng & Units 07/10/2022    5:02 AM 07/09/2022    5:03 PM 07/09/2022   11:31 AM  CBC  WBC 4.0 - 10.5 K/uL 6.3  6.8  8.1   Hemoglobin 12.0 - 15.0 g/dL 11.0  12.5  13.2   Hematocrit 36.0 - 46.0 % 33.1  37.6  38.4   Platelets 150 - 400 K/uL 436  514  559.0       Latest Ref Rng & Units 07/10/2022    5:02 AM 07/09/2022    5:03 PM 07/09/2022    2:38 PM  CMP  Glucose 70 - 99 mg/dL 104  108    BUN 8 - 23 mg/dL 9  13    Creatinine 0.44 - 1.00 mg/dL 0.73  0.71  0.70   Sodium 135 - 145 mmol/L 142  140    Potassium 3.5 - 5.1 mmol/L 4.0  3.7    Chloride 98 - 111 mmol/L 110  105    CO2 22 - 32 mmol/L 25  25    Calcium 8.9 - 10.3 mg/dL 8.6  9.2    Total Protein 6.5 - 8.1 g/dL  7.7    Total Bilirubin 0.3 - 1.2 mg/dL  0.7    Alkaline Phos 38 - 126 U/L  163    AST 15 - 41 U/L  32    ALT 0 - 44 U/L  41      Imaging studies: No new pertinent imaging studies   Assessment/Plan: (ICD-10's: K35.32) 61 y.o. female with perforated appendicitis with significant phlegmon without gross abscess nor peritonitis.   - Okay to advance to FLD; can consider soft  diet for dinner vs breakfast tomorrow    - Continue IV Abx (Zosyn)  - Wean IVF - No surgical intervention currently. Cotinue conservative measures as outline with goal of interval appendectomy as outpatient in ~8 weeks once recovered. She understands that is she were to clinically deteriorate at any time, we would need to consider more urgent interventions   - I do not think we need repeat imaging unless her clinical condition changes   - Monitor abdominal examination; on-going bowel function - Pain control prn; antiemetics prn - Okay to mobilize    - Discharge Planning: Doing remarkably well; diet advancing. May be able to DC in next 24-48 hours   All of the above findings and recommendations were discussed with the patient, and the medical team, and all of patient's questions were answered to her expressed satisfaction.  -- Edison Simon,  PA-C  [facility] 07/10/2022, 7:44 AM M-F: 7am - 4pm

## 2022-07-11 DIAGNOSIS — K3532 Acute appendicitis with perforation and localized peritonitis, without abscess: Secondary | ICD-10-CM | POA: Diagnosis not present

## 2022-07-11 LAB — CBC
HCT: 33.4 % — ABNORMAL LOW (ref 36.0–46.0)
Hemoglobin: 11.4 g/dL — ABNORMAL LOW (ref 12.0–15.0)
MCH: 30.4 pg (ref 26.0–34.0)
MCHC: 34.1 g/dL (ref 30.0–36.0)
MCV: 89.1 fL (ref 80.0–100.0)
Platelets: 458 10*3/uL — ABNORMAL HIGH (ref 150–400)
RBC: 3.75 MIL/uL — ABNORMAL LOW (ref 3.87–5.11)
RDW: 11.9 % (ref 11.5–15.5)
WBC: 5.7 10*3/uL (ref 4.0–10.5)
nRBC: 0 % (ref 0.0–0.2)

## 2022-07-11 LAB — BASIC METABOLIC PANEL
Anion gap: 5 (ref 5–15)
BUN: 7 mg/dL — ABNORMAL LOW (ref 8–23)
CO2: 27 mmol/L (ref 22–32)
Calcium: 8.6 mg/dL — ABNORMAL LOW (ref 8.9–10.3)
Chloride: 110 mmol/L (ref 98–111)
Creatinine, Ser: 0.85 mg/dL (ref 0.44–1.00)
GFR, Estimated: >60 mL/min (ref >60)
Glucose, Bld: 105 mg/dL — ABNORMAL HIGH (ref 70–99)
Potassium: 3.7 mmol/L (ref 3.5–5.1)
Sodium: 142 mmol/L (ref 135–145)

## 2022-07-11 MED ORDER — AMOXICILLIN-POT CLAVULANATE 875-125 MG PO TABS: 1.0000 | ORAL_TABLET | Freq: Two times a day (BID) | ORAL | 0 refills | Status: AC

## 2022-07-11 NOTE — TOC CM/SW Note (Signed)
Patient has orders to discharge home today. Chart reviewed. No TOC needs identified.  Regie Bunner, CSW [phone]  

## 2022-07-11 NOTE — Plan of Care (Signed)
  Problem: Education: Goal: Knowledge of General Education information will improve Description: Including pain rating scale, medication(s)/side effects and non-pharmacologic comfort measures Outcome: Progressing   Problem: Clinical Measurements: Goal: Will remain free from infection Outcome: Progressing   Problem: Clinical Measurements: Goal: Respiratory complications will improve Outcome: Progressing   Problem: Clinical Measurements: Goal: Cardiovascular complication will be avoided Outcome: Progressing   Problem: Activity: Goal: Risk for activity intolerance will decrease Outcome: Progressing   Problem: Nutrition: Goal: Adequate nutrition will be maintained Outcome: Progressing   Problem: Pain Managment: Goal: General experience of comfort will improve Outcome: Progressing

## 2022-07-11 NOTE — Progress Notes (Signed)
Pt discharged per MD order. IV removed. Discharge instructions reviewed with pt. Pt verbalized understanding. All question answered to pt satisfaction. Pt taken to car in wheelchair by volunteer.

## 2022-07-11 NOTE — Discharge Summary (Signed)
[facility] SUR[facility]ICAL DISCHARGE SUMMARY (cpt: [phone])  Patient ID: Kristine Mcguire MRN: [id number] DOB/AGE: Aug 23, 1960 61 y.o.  Admit date: 07/09/2022 Discharge date: 07/11/2022  Discharge Diagnoses Patient Active Problem List   Diagnosis Date Noted   RLQ abdominal pain 07/09/2022   Acute appendicitis 07/09/2022   Perforated appendicitis 07/09/2022    Consultants None  Procedures None  HPI: Kristine Mcguire is a 61 y.o. female 60 year old female with a 12-day history of intermittent abdominal pain.  She reports that the pain was in the right lower quadrant and improved with over-the-counter Tylenol. She reports that her pain is mild and sharp.  No specific alleviating or aggravating factors. She is 66 but she is very healthy.  She is able to perform more than 6 METS of activity without any shortness of breath or chest pain. No family history of Crohn's disease she has been febrile for the last few days. No nausea no vomiting.  Actually today felt better.  She did go to the primary care doctor who ordered a stat CT.  CT personally reviewed and there is evidence of some perforation with fluid and significant terminal ileitis as a result of phlegmonous changes from appendicitis.  There is no overt free air. CBC and CMP completely normal. Had normal colonoscopy last year.  Hospital Course: Patient admitted and underwent conservative measures. She did markedly well. Advancement of patient's diet and ambulation were well-tolerated. The remainder of patient's hospital course was essentially unremarkable, and discharge planning was initiated accordingly with patient safely able to be discharged home with appropriate discharge instructions, antibiotics(Augmentin x11 days to complete 14 total), pain control, and outpatient follow-up after all of her questions were answered to her expressed satisfaction.   Discharge Condition: Good   Physical Examination:  Constitutional:  alert, cooperative and no distress  HENT: normocephalic without obvious abnormality  Eyes: PERRL, EOM's grossly intact and symmetric  Neuro: CN II - XII grossly intact and symmetric without deficit  Respiratory: breathing non-labored at rest  Cardiovascular: regular rate and sinus rhythm  Gastrointestinal: Abdomen is soft, non-tender, non-distended, no rebound/guarding. Negative McBurney's Sign. She is certainly without peritonitis Musculoskeletal: No edema or wounds, motor and sensation grossly intact, NT    Allergies as of 07/11/2022   No Known Allergies      Medication List     TAKE these medications    amoxicillin-clavulanate 875-125 MG tablet Commonly known as: AUGMENTIN Take 1 tablet by mouth 2 (two) times daily for 11 days.   citalopram 10 MG tablet Commonly known as: CELEXA TAKE 1 TABLET BY MOUTH EVERY DAY   Red Yeast Rice 600 MG Caps Take 1 capsule by mouth 2 (two) times daily.   Vitamin D3 25 MCG (1000 UT) Chew Chew 2 tablets by mouth daily.          Follow-up Information     Pabon, [place] F, MD Follow up in 4 week(s).   Specialty: General Surgery Why: Hospital follow up; appendicitis with perforation; surgical planning Contact information: [address] [place] [address] [phone]                  Time spent on discharge management including discussion of hospital course, clinical condition, outpatient instructions, prescriptions, and follow up with the patient and members of the medical team: >30 minutes  -- Edison Simon , PA-C [facility]  07/11/2022, 8:02 AM [phone] M-F: 7am - 4pm

## 2022-07-11 NOTE — Discharge Instructions (Signed)
In addition to included general instructions for,  Diet: Resume home diet.   Medications: Resume all home medications. For mild to moderate pain: acetaminophen (Tylenol) or ibuprofen/naproxen (if no kidney disease).  Call office [phone]) at any time if any questions, worsening pain, fevers/chills, bleeding, drainage from incision site, or other concerns.

## 2022-07-12 ENCOUNTER — Other Ambulatory Visit: Payer: Self-pay | Admitting: Family

## 2022-07-12 DIAGNOSIS — R899 Unspecified abnormal finding in specimens from other organs, systems and tissues: Secondary | ICD-10-CM

## 2022-07-18 ENCOUNTER — Ambulatory Visit
Admission: RE | Admit: 2022-07-18 | Discharge: 2022-07-18 | Disposition: A | Discharge status: Home / Self Care | Payer: BC Managed Care – PPO | Source: Ambulatory Visit | Attending: Family | Admitting: Family

## 2022-07-18 DIAGNOSIS — R935 Abnormal findings on diagnostic imaging of other abdominal regions, including retroperitoneum: Secondary | ICD-10-CM | POA: Diagnosis present

## 2022-07-22 ENCOUNTER — Encounter: Payer: Self-pay | Admitting: Internal Medicine

## 2022-07-23 ENCOUNTER — Telehealth: Payer: Self-pay | Admitting: Family

## 2022-07-23 ENCOUNTER — Encounter: Payer: Self-pay | Admitting: Family

## 2022-07-23 ENCOUNTER — Other Ambulatory Visit: Payer: Self-pay | Admitting: Family

## 2022-07-23 DIAGNOSIS — R160 Hepatomegaly, not elsewhere classified: Secondary | ICD-10-CM

## 2022-07-23 DIAGNOSIS — K769 Liver disease, unspecified: Secondary | ICD-10-CM

## 2022-07-23 DIAGNOSIS — D649 Anemia, unspecified: Secondary | ICD-10-CM

## 2022-07-23 NOTE — Telephone Encounter (Signed)
Spoke with pt She is sch for labs She is agreeable to liver US ruq which I have ordered She plans to dicuss with dr Derrel Nip hepatic steatosis at appt next week

## 2022-07-24 ENCOUNTER — Other Ambulatory Visit (INDEPENDENT_AMBULATORY_CARE_PROVIDER_SITE_OTHER): Payer: BC Managed Care – PPO

## 2022-07-24 DIAGNOSIS — D649 Anemia, unspecified: Secondary | ICD-10-CM | POA: Diagnosis not present

## 2022-07-24 DIAGNOSIS — R899 Unspecified abnormal finding in specimens from other organs, systems and tissues: Secondary | ICD-10-CM

## 2022-07-24 LAB — CBC WITH DIFFERENTIAL/PLATELET
Basophils Absolute: 0.1 10*3/uL (ref 0.0–0.1)
Basophils Relative: 1.2 % (ref 0.0–3.0)
Eosinophils Absolute: 0.2 10*3/uL (ref 0.0–0.7)
Eosinophils Relative: 3 % (ref 0.0–5.0)
HCT: 40.4 % (ref 36.0–46.0)
Hemoglobin: 13.7 g/dL (ref 12.0–15.0)
Lymphocytes Relative: 36.8 % (ref 12.0–46.0)
Lymphs Abs: 2 10*3/uL (ref 0.7–4.0)
MCHC: 33.9 g/dL (ref 30.0–36.0)
MCV: 90.8 fl (ref 78.0–100.0)
Monocytes Absolute: 0.5 10*3/uL (ref 0.1–1.0)
Monocytes Relative: 9.2 % (ref 3.0–12.0)
Neutro Abs: 2.6 10*3/uL (ref 1.4–7.7)
Neutrophils Relative %: 49.8 % (ref 43.0–77.0)
Platelets: 395 10*3/uL (ref 150.0–400.0)
RBC: 4.45 Mil/uL (ref 3.87–5.11)
RDW: 13.1 % (ref 11.5–15.5)
WBC: 5.3 10*3/uL (ref 4.0–10.5)

## 2022-07-24 LAB — HEPATIC FUNCTION PANEL
ALT: 22 U/L (ref 0–35)
AST: 20 U/L (ref 0–37)
Albumin: 4.4 g/dL (ref 3.5–5.2)
Alkaline Phosphatase: 96 U/L (ref 39–117)
Bilirubin, Direct: 0.1 mg/dL (ref 0.0–0.3)
Total Bilirubin: 0.6 mg/dL (ref 0.2–1.2)
Total Protein: 6.9 g/dL (ref 6.0–8.3)

## 2022-07-24 LAB — IBC + FERRITIN
Ferritin: 127.1 ng/mL (ref 10.0–291.0)
Iron: 204 ug/dL — ABNORMAL HIGH (ref 42–145)
Saturation Ratios: 62.3 % — ABNORMAL HIGH (ref 20.0–50.0)
TIBC: 327.6 ug/dL (ref 250.0–450.0)
Transferrin: 234 mg/dL (ref 212.0–360.0)

## 2022-07-24 LAB — GAMMA GT: GGT: 56 U/L — ABNORMAL HIGH (ref 7–51)

## 2022-07-24 NOTE — Telephone Encounter (Signed)
Spoke to patient and scheduled her lab  appt for today and canceled the one for the 19th

## 2022-07-29 ENCOUNTER — Telehealth (INDEPENDENT_AMBULATORY_CARE_PROVIDER_SITE_OTHER): Payer: BC Managed Care – PPO | Admitting: Internal Medicine

## 2022-07-29 VITALS — Ht 65.0 in | Wt 145.0 lb

## 2022-07-29 DIAGNOSIS — K760 Fatty (change of) liver, not elsewhere classified: Secondary | ICD-10-CM | POA: Diagnosis not present

## 2022-07-29 DIAGNOSIS — R160 Hepatomegaly, not elsewhere classified: Secondary | ICD-10-CM | POA: Insufficient documentation

## 2022-07-29 LAB — ALKALINE PHOSPHATASE, ISOENZYMES
Alkaline Phosphatase: 110 IU/L (ref 44–121)
BONE FRACTION: 28 % (ref 14–68)
INTESTINAL FRAC.: 0 % (ref 0–18)
LIVER FRACTION: 72 % (ref 18–85)

## 2022-07-29 NOTE — Assessment & Plan Note (Signed)
Not seen on CT ,  but on ultrasound. MRI deferred until she sees general surgery

## 2022-07-29 NOTE — Assessment & Plan Note (Addendum)
Suggested by CT and ultrasound . With elevated TSAT of 62% suggestive of iron overload.  Additional labs ordered,  will likely need liver biopsy

## 2022-07-29 NOTE — Progress Notes (Signed)
Virtual Visit via [facility]   Note   This format is felt to be most appropriate for this patient at this time.  All issues noted in this document were discussed and addressed.  No physical exam was performed (except for noted visual exam findings with Video Visits).   I connected with Beth on 07/29/22 at  4:00 PM EST by a video enabled telemedicine application  and verified that I am speaking with the correct person using two identifiers. Location patient: home Location provider: work or home office Persons participating in the virtual visit: patient, provider  I discussed the limitations, risks, security and privacy concerns of performing an evaluation and management service by telephone and the availability of in person appointments. I also discussed with the patient that there may be a patient responsible charge related to this service. The patient expressed understanding and agreed to proceed.  Reason for visit: recent hospitalization for perforated appendix   HPI:  Admitted to [facility] on Nov 28 with perforated appendix by CT scan  ordered by Kathrin Penner during workup for right sided abd pain x 12 days .  He was admitted,  treated with broad spectrum antibiotics for 48 hours,  and sent home with augmentin  x 11 days,  and told to follow up with [facility] [facility] in  4 weeks.  Appt is Jan 3    Since her discharge, MA has ordered an abdominal ultrasound to follow up on an indeterminate 7 mm cyst vs hemangioma .  FAtty liver was also noted. On CT scan   Unfortunately a 2.2 cm indeterminate lesion was noted  on the ultrasound which was not mentioned on CT scan , and an MRI was recommended for further evaluation and is scheduled for Dec 27  Additionally,  iron studies were ordered due to new onset anemia.  Iron saturation was elevated at 62.3 (20-50)  and iron at 204 (42-145) , but ferritin ws normal.   .tt     ROS: See pertinent positives and negatives per HPI.  Past  Medical History:  Diagnosis Date   Atypical mole 03/08/2014   L distal ant lat thigh, excised 04/26/2014   History of chicken pox    Hx of dysplastic nevus    R tricep, txted in past per patient   Hx of dysplastic nevus 03/08/2014   R lower back, moderate atypia   Migraine    UTI (urinary tract infection)     Past Surgical History:  Procedure Laterality Date   CESAREAN SECTION  1992   COLONOSCOPY WITH PROPOFOL N/A 10/30/2020   Procedure: COLONOSCOPY WITH PROPOFOL;  Surgeon: Jonathon Bellows, MD;  Location: [facility] ENDOSCOPY;  Service: Gastroenterology;  Laterality: N/A;   DEBRIDEMENT TENNIS ELBOW  208   Right elbow    Family History  Problem Relation Age of Onset   Hyperlipidemia Mother    Hypertension Mother 3       Runs in mother's side of family   Cancer Father        bone   Heart attack Maternal Uncle 46   Heart attack Maternal Grandmother 76   Pulmonary fibrosis Maternal Grandmother    Heart attack Maternal Grandfather 22   Alzheimer's disease Paternal Grandmother    Pulmonary fibrosis Brother 16       idiopathic    Pulmonary fibrosis Paternal Uncle    Breast cancer Maternal Aunt        postmeno.    SOCIAL HX:  reports that she has  never smoked. She has never used smokeless tobacco. She reports current alcohol use. She reports that she does not use drugs.    Current Outpatient Medications:    Cholecalciferol (VITAMIN D3) 1000 UNITS CHEW, Chew 2 tablets by mouth daily. , Disp: , Rfl:    citalopram (CELEXA) 10 MG tablet, TAKE 1 TABLET BY MOUTH EVERY DAY, Disp: 90 tablet, Rfl: 3   Red Yeast Rice 600 MG CAPS, Take 1 capsule by mouth 2 (two) times daily., Disp: , Rfl:   EXAM:  VITALS per patient if applicable:  GENERAL: alert, oriented, appears well and in no acute distress  HEENT: atraumatic, conjunttiva clear, no obvious abnormalities on inspection of external nose and ears  NECK: normal movements of the head and neck  LUNGS: on inspection no signs of respiratory  distress, breathing rate appears normal, no obvious gross SOB, gasping or wheezing  CV: no obvious cyanosis  MS: moves all visible extremities without noticeable abnormality  PSYCH/NEURO: pleasant and cooperative, no obvious depression or anxiety, speech and thought processing grossly intact  ASSESSMENT AND PLAN: Hepatic steatosis Assessment & Plan: Suggested by CT and ultrasound . With elevated TSAT of 62% suggestive of iron overload.  Additional labs ordered,  will likely need liver biopsy   Orders: -     Hemochromatosis DNA-PCR(c282y,h63d); Future  Liver mass, right lobe Assessment & Plan: Not seen on CT ,  but on ultrasound. MRI deferred until she sees general surgery   Orders: -     AFP tumor marker; Future  Iron overload -     Hemochromatosis DNA-PCR(c282y,h63d); Future      I discussed the assessment and treatment plan with the patient. The patient was provided an opportunity to ask questions and all were answered. The patient agreed with the plan and demonstrated an understanding of the instructions.   The patient was advised to call back or seek an in-person evaluation if the symptoms worsen or if the condition fails to improve as anticipated.   I spent 30 minutes dedicated to the care of this patient on the date of this encounter to include pre-visit review of his medical history,  Face-to-face time with the patient , and post visit ordering of testing and therapeutics.    [name], MD

## 2022-07-30 ENCOUNTER — Other Ambulatory Visit: Payer: BC Managed Care – PPO

## 2022-07-30 ENCOUNTER — Other Ambulatory Visit (INDEPENDENT_AMBULATORY_CARE_PROVIDER_SITE_OTHER): Payer: BC Managed Care – PPO

## 2022-07-30 ENCOUNTER — Encounter: Payer: Self-pay | Admitting: Internal Medicine

## 2022-07-30 DIAGNOSIS — K760 Fatty (change of) liver, not elsewhere classified: Secondary | ICD-10-CM

## 2022-07-30 DIAGNOSIS — R160 Hepatomegaly, not elsewhere classified: Secondary | ICD-10-CM | POA: Diagnosis not present

## 2022-08-07 ENCOUNTER — Ambulatory Visit
Admission: RE | Admit: 2022-08-07 | Discharge: 2022-08-07 | Disposition: A | Discharge status: Home / Self Care | Payer: BC Managed Care – PPO | Source: Ambulatory Visit | Attending: Family | Admitting: Family

## 2022-08-07 DIAGNOSIS — R160 Hepatomegaly, not elsewhere classified: Secondary | ICD-10-CM | POA: Diagnosis present

## 2022-08-07 MED ORDER — GADOBUTROL 1 MMOL/ML IV SOLN
6.0000 mL | Freq: Once | INTRAVENOUS | Status: AC | PRN
  Administered 2022-08-07: 6 mL via INTRAVENOUS

## 2022-08-08 LAB — AFP TUMOR MARKER: AFP-Tumor Marker: 2.8 ng/mL

## 2022-08-08 LAB — HEMOCHROMATOSIS DNA-PCR(C282Y,H63D)

## 2022-08-14 ENCOUNTER — Telehealth: Payer: Self-pay | Admitting: Surgery

## 2022-08-14 ENCOUNTER — Encounter: Payer: Self-pay | Admitting: Surgery

## 2022-08-14 ENCOUNTER — Ambulatory Visit: Payer: BC Managed Care – PPO | Admitting: Surgery

## 2022-08-14 ENCOUNTER — Other Ambulatory Visit: Payer: Self-pay

## 2022-08-14 VITALS — BP 122/70 | HR 64 | Temp 98.3°F | Ht 65.0 in | Wt 148.4 lb

## 2022-08-14 DIAGNOSIS — K3532 Acute appendicitis with perforation and localized peritonitis, without abscess: Secondary | ICD-10-CM

## 2022-08-14 NOTE — Telephone Encounter (Signed)
Patient has been advised of Pre-Admission date/time, and Surgery date at [facility].  Surgery Date: 08/29/22 Preadmission Testing Date: 08/21/22 (phone 8a-1p)  Patient has been made aware to call [phone], between 1-3:00pm the day before surgery, to find out what time to arrive for surgery.

## 2022-08-14 NOTE — H&P (View-Only) (Signed)
Surgical Consultation  08/14/2022  Kristine Mcguire is an 62 y.o. female.   Chief Complaint  Patient presents with   Follow-up    Perforated appendicitis     HPI: Is a 62 year old female well-known to me with prior history of perforated appendicitis managed medically with antibiotics and she responded well.  She was hospitalized for a couple of days and did very well.  I have personally reviewed her CT scan recently showing evidence of acute appendicitis with perforation.  She has done well since.  She reports that she did have some transient diarrhea related to the antibiotics but now feels better.  She is tolerating diet and is back to normal.  She is able to perform more than 4 METS of activity without any shortness of breath or chest pain.  Lab work performed by Dr. Myriam Jacobson from primary care shows elevation of alkaline phosphatase as well as GGT.  Also underwent MRI of the liver that I have personally reviewed showing evidence of diffuse hepatic asteatosis.  No other suspicious lesions. She did have a history of C-section in the past. Her husband is a Insurance underwriter and her son is a Designer, television/film set in [place].  She actually spent the holidays here in [place] due to recent illness.   Past Medical History:  Diagnosis Date   Atypical mole 03/08/2014   L distal ant lat thigh, excised 04/26/2014   History of chicken pox    Hx of dysplastic nevus    R tricep, txted in past per patient   Hx of dysplastic nevus 03/08/2014   R lower back, moderate atypia   Migraine    UTI (urinary tract infection)     Past Surgical History:  Procedure Laterality Date   CESAREAN SECTION  1992   COLONOSCOPY WITH PROPOFOL N/A 10/30/2020   Procedure: COLONOSCOPY WITH PROPOFOL;  Surgeon: Jonathon Bellows, MD;  Location: [facility] ENDOSCOPY;  Service: Gastroenterology;  Laterality: N/A;   DEBRIDEMENT TENNIS ELBOW  208   Right elbow    Family History  Problem Relation Age of Onset   Hyperlipidemia Mother     Hypertension Mother 60       Runs in mother's side of family   Cancer Father        bone   Heart attack Maternal Uncle 46   Heart attack Maternal Grandmother 76   Pulmonary fibrosis Maternal Grandmother    Heart attack Maternal Grandfather 57   Alzheimer's disease Paternal Grandmother    Pulmonary fibrosis Brother 75       idiopathic    Pulmonary fibrosis Paternal Uncle    Breast cancer Maternal Aunt        postmeno.    Social History:  reports that she has never smoked. She has never used smokeless tobacco. She reports current alcohol use. She reports that she does not use drugs.  Allergies: No Known Allergies  Medications reviewed.     ROS Full ROS performed and is otherwise negative other than what is stated in the HPI    BP 122/70   Pulse 64   Temp 98.3 F (36.8 C) (Oral)   Ht '5\' 5"'$  (1.651 m)   Wt 148 lb 6.4 oz (67.3 kg)   SpO2 96%   BMI 24.70 kg/m   Physical Exam Vitals and nursing note reviewed. Exam conducted with a chaperone present.  Constitutional:      General: She is not in acute distress.    Appearance: Normal appearance. She is normal weight.  Eyes:     Extraocular Movements: Extraocular movements intact.     Pupils: Pupils are equal, round, and reactive to light.  Cardiovascular:     Rate and Rhythm: Normal rate and regular rhythm.     Heart sounds: No murmur heard. Pulmonary:     Effort: Pulmonary effort is normal. No respiratory distress.     Breath sounds: Normal breath sounds. No stridor. No wheezing or rhonchi.  Abdominal:     General: Abdomen is flat. There is no distension.     Palpations: Abdomen is soft. There is no mass.     Tenderness: There is no abdominal tenderness. There is no guarding or rebound.     Hernia: No hernia is present.  Musculoskeletal:     Cervical back: Normal range of motion and neck supple. No rigidity or tenderness.  Skin:    General: Skin is warm.     Capillary Refill: Capillary refill takes less than 2  seconds.  Neurological:     General: No focal deficit present.     Mental Status: She is alert and oriented to person, place, and time.  Psychiatric:        Mood and Affect: Mood normal.        Behavior: Behavior normal.        Thought Content: Thought content normal.        Judgment: Judgment normal.      Assessment/Plan: [name] is a 62 year old female with history of perforated appendicitis back in November that was treated with antibiotics and she responded well.  Not she is for interval appendectomy and liver biopsy.  I Do recommend interval appendectomy given recurrences as well as potential causes that may cause appendicitis such as small appendiceal tumors, been increasing LFTs Dr. Derrel Nip is also requesting a liver biopsy which I do think that is reasonable and adds minimal risks.  Procedures discussed with the patient in detail .  Risks, benefits and possible medications including but not limited to: Bleeding, infection, bowel injuries, chronic pain potential interventions.  She understands and wished to proceed.  Will tentatively schedule her in about 2-1/2 weeks from now. I spent 40 minutes in this encounter including personally reviewing imaging studies, coordinating her care, placing orders, counseling the patient and performing appropriate documentation  Caroleen Hamman, MD [facility]

## 2022-08-14 NOTE — Progress Notes (Signed)
Surgical Consultation  08/14/2022  Kristine Mcguire is an 62 y.o. female.   Chief Complaint  Patient presents with   Follow-up    Perforated appendicitis     HPI: Is a 62 year old female well-known to me with prior history of perforated appendicitis managed medically with antibiotics and she responded well.  She was hospitalized for a couple of days and did very well.  I have personally reviewed her CT scan recently showing evidence of acute appendicitis with perforation.  She has done well since.  She reports that she did have some transient diarrhea related to the antibiotics but now feels better.  She is tolerating diet and is back to normal.  She is able to perform more than 4 METS of activity without any shortness of breath or chest pain.  Lab work performed by Dr. Myriam Mcguire from primary care shows elevation of alkaline phosphatase as well as GGT.  Also underwent MRI of the liver that I have personally reviewed showing evidence of diffuse hepatic asteatosis.  No other suspicious lesions. She did have a history of C-section in the past. Her husband is a Insurance underwriter and her son is a Designer, television/film set in [place].  She actually spent the holidays here in [place] due to recent illness.   Past Medical History:  Diagnosis Date   Atypical mole 03/08/2014   L distal ant lat thigh, excised 04/26/2014   History of chicken pox    Hx of dysplastic nevus    R tricep, txted in past per patient   Hx of dysplastic nevus 03/08/2014   R lower back, moderate atypia   Migraine    UTI (urinary tract infection)     Past Surgical History:  Procedure Laterality Date   CESAREAN SECTION  1992   COLONOSCOPY WITH PROPOFOL N/A 10/30/2020   Procedure: COLONOSCOPY WITH PROPOFOL;  Surgeon: Jonathon Bellows, MD;  Location: [facility] ENDOSCOPY;  Service: Gastroenterology;  Laterality: N/A;   DEBRIDEMENT TENNIS ELBOW  208   Right elbow    Family History  Problem Relation Age of Onset   Hyperlipidemia Mother     Hypertension Mother 31       Runs in mother's side of family   Cancer Father        bone   Heart attack Maternal Uncle 46   Heart attack Maternal Grandmother 76   Pulmonary fibrosis Maternal Grandmother    Heart attack Maternal Grandfather 2   Alzheimer's disease Paternal Grandmother    Pulmonary fibrosis Brother 92       idiopathic    Pulmonary fibrosis Paternal Uncle    Breast cancer Maternal Aunt        postmeno.    Social History:  reports that she has never smoked. She has never used smokeless tobacco. She reports current alcohol use. She reports that she does not use drugs.  Allergies: No Known Allergies  Medications reviewed.     ROS Full ROS performed and is otherwise negative other than what is stated in the HPI    BP 122/70   Pulse 64   Temp 98.3 F (36.8 C) (Oral)   Ht '5\' 5"'$  (1.651 m)   Wt 148 lb 6.4 oz (67.3 kg)   SpO2 96%   BMI 24.70 kg/m   Physical Exam Vitals and nursing note reviewed. Exam conducted with a chaperone present.  Constitutional:      General: She is not in acute distress.    Appearance: Normal appearance. She is normal weight.  Eyes:     Extraocular Movements: Extraocular movements intact.     Pupils: Pupils are equal, round, and reactive to light.  Cardiovascular:     Rate and Rhythm: Normal rate and regular rhythm.     Heart sounds: No murmur heard. Pulmonary:     Effort: Pulmonary effort is normal. No respiratory distress.     Breath sounds: Normal breath sounds. No stridor. No wheezing or rhonchi.  Abdominal:     General: Abdomen is flat. There is no distension.     Palpations: Abdomen is soft. There is no mass.     Tenderness: There is no abdominal tenderness. There is no guarding or rebound.     Hernia: No hernia is present.  Musculoskeletal:     Cervical back: Normal range of motion and neck supple. No rigidity or tenderness.  Skin:    General: Skin is warm.     Capillary Refill: Capillary refill takes less than 2  seconds.  Neurological:     General: No focal deficit present.     Mental Status: She is alert and oriented to person, place, and time.  Psychiatric:        Mood and Affect: Mood normal.        Behavior: Behavior normal.        Thought Content: Thought content normal.        Judgment: Judgment normal.      Assessment/Plan: Kristine Mcguire is a 62 year old female with history of perforated appendicitis back in November that was treated with antibiotics and she responded well.  Not she is for interval appendectomy and liver biopsy.  I Do recommend interval appendectomy given recurrences as well as potential causes that may cause appendicitis such as small appendiceal tumors, been increasing LFTs Dr. Derrel Nip is also requesting a liver biopsy which I do think that is reasonable and adds minimal risks.  Procedures discussed with the patient in detail .  Risks, benefits and possible medications including but not limited to: Bleeding, infection, bowel injuries, chronic pain potential interventions.  She understands and wished to proceed.  Will tentatively schedule her in about 2-1/2 weeks from now. I spent 40 minutes in this encounter including personally reviewing imaging studies, coordinating her care, placing orders, counseling the patient and performing appropriate documentation  Caroleen Hamman, MD [facility]

## 2022-08-14 NOTE — Patient Instructions (Addendum)
Our surgery scheduler will call you within 24-48 hours to schedule your surgery. Please have the Kristine Mcguire surgery sheet available when speaking with her.   Appendicitis, Adult  The appendix is a finger-shaped tube that is attached to the large intestine. Appendicitis is inflammation of the appendix. If appendicitis is not treated, it can cause the appendix to tear (rupture). A ruptured appendix can lead to a life-threatening infection. It can also cause a painful collection of pus (abscess) to form in the appendix. What are the causes? This condition may be caused by a blockage in the appendix that leads to infection. The blockage can be caused by: A ball of stool (fecal impaction). Enlarged lymph glands in the intestine. Injury (trauma) to the abdomen. In some cases, the cause may not be known. What increases the risk? This condition is more likely to develop in people who are 63-15 years old. What are the signs or symptoms? Symptoms of this condition include: Pain or tenderness that starts around the belly button and: Moves toward the lower right abdomen. Gets more severe as time passes. Gets worse with coughing or sudden movements. Nausea, vomiting, or loss of appetite. Fever. Constipation or diarrhea. Feeling general discomfort (malaise). How is this diagnosed? This condition may be diagnosed with: A physical exam. Blood tests. Urine test. To confirm the diagnosis, an ultrasound, MRI, or CT scan may be done. How is this treated? This condition can sometimes be treated with antibiotic medicine alone, but it is usually treated with both antibiotics and surgery to remove the appendix (appendectomy). If only antibiotics are given and the appendix is not removed, there is a chance that appendicitis could come back. There are two methods for doing an appendectomy: Open appendectomy. In this surgery, the appendix is removed through a large incision that is made in the lower right abdomen.  This procedure may be recommended if: You have major scarring from a previous surgery. You have a bleeding disorder. You are pregnant and are about to give birth. You have a condition that makes it hard to do surgery through small incisions (laparoscopic procedure). This includes severe infection or a ruptured appendix. Laparoscopic appendectomy. For this method, the appendix is removed through small incisions. This procedure usually causes less pain and fewer problems than an open appendectomy. It also has a shorter recovery time. If the appendix has ruptured and an abscess has formed, a tube (drain) may be placed into the abscess to remove fluid, and antibiotics may be given through an IV. The appendix may or may not need to be removed. Follow these instructions at home: If you had surgery: Follow instructions from your health care provider on how to: Care for yourself at home. Take care of your incision. Medicines Take over-the-counter and prescription medicines only as told by your health care provider. If you were prescribed an antibiotic medicine, take it as told by your health care provider. Do not stop using the antibiotic even if you start to feel better. Ask your health care provider if the medicine prescribed to you: Requires you to avoid driving or using machinery. Can cause constipation. You may need to take these actions to prevent or treat constipation: Drink enough fluid to keep your urine pale yellow. Take over-the-counter or prescription medicines. Eat foods that are high in fiber, such as beans, whole grains, and fresh fruits and vegetables. Limit foods that are high in fat and processed sugars, such as fried or sweet foods. General instructions Follow instructions from your  health care provider about eating or drinking restrictions. Do not use any products that contain nicotine or tobacco. These products include cigarettes, chewing tobacco, and vaping devices, such as  e-cigarettes. These can delay incision healing after surgery. If you need help quitting, ask your health care provider. Return to your normal activities as told by your health care provider. Ask your health care provider what activities are safe for you. Keep all follow-up visits. This is important. Contact a health care provider if: There is pus, blood, or excessive drainage coming from your incision. You have nausea or vomiting. You have a fever. You are feeling tired (fatigue) or muscle aches. Get help right away if: You have worsening abdominal pain that does not get better with medicine. You have shortness of breath. You cannot stop vomiting. These symptoms may represent a serious problem that is an emergency. Do not wait to see if the symptoms will go away. Get medical help right away. Call your local emergency services (911 in the [place] Do not drive yourself to the hospital. Summary Appendicitis is inflammation of the appendix. It can be caused by a blockage in the appendix that leads to infection. This condition is usually treated with both antibiotic medicines and surgery to remove the appendix (appendectomy). Symptoms include pain or tenderness that starts around your belly button and moves toward the lower right abdomen, nausea, vomiting, loss of appetite, fever, constipation or diarrhea, and malaise. To confirm the diagnosis, an ultrasound, MRI, or CT scan may be done. This information is not intended to replace advice given to you by your health care provider. Make sure you discuss any questions you have with your health care provider. Document Revised: 01/18/2021 Document Reviewed: 01/18/2021 Elsevier Patient Education  [employer].

## 2022-08-16 ENCOUNTER — Inpatient Hospital Stay: Payer: BC Managed Care – PPO | Attending: Oncology | Admitting: Oncology

## 2022-08-16 ENCOUNTER — Inpatient Hospital Stay: Payer: BC Managed Care – PPO

## 2022-08-16 ENCOUNTER — Encounter: Payer: Self-pay | Admitting: Oncology

## 2022-08-16 DIAGNOSIS — K760 Fatty (change of) liver, not elsewhere classified: Secondary | ICD-10-CM | POA: Insufficient documentation

## 2022-08-16 DIAGNOSIS — E785 Hyperlipidemia, unspecified: Secondary | ICD-10-CM | POA: Insufficient documentation

## 2022-08-16 DIAGNOSIS — E875 Hyperkalemia: Secondary | ICD-10-CM | POA: Diagnosis not present

## 2022-08-16 NOTE — Progress Notes (Signed)
Hematology/Oncology Consult note [facility] Telephone:([phone] Fax:([phone]) [phone]  Patient Care Team: [name], MD as PCP - General (Internal Medicine)   Name of the patient: Kristine Mcguire  [id number]  [birth year]    Reason for referral-referral for possible hemochromatosis   Referring physician-Dr. Derrel Nip  Date of visit: 08/16/22   History of presenting illness- Patient is a 62 year old female with a past medical history significant for hyperlipidemia.  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  She had undergone a CT abdomen and pelvis with contrast in November 2023 which showed fatty infiltration of the liver and low-density lesion in the inferior right hepatic lobe measuring 7 mm.  At that time there was also signs of acute appendicitis..  She then had a right upper quadrant ultrasound which showed a 2.2 cm echogenic mass in the right hepatic lobe which could be a hemangioma but was otherwise indeterminate.  This was followed by an MRI liver with and without contrast which was motion degraded but showed diffuse hepatic steatosis and no suspicious hepatic lesion was found.  AFP normal at 2.8.  GGT mildly elevated at 56.  Liver enzymes normal.  Ferritin was normal at 127 with an iron saturation of 62%.  No family history of known hemochromatosis.  Patient drinks about 2 alcoholic drinks per week.  Patient  ECOG PS- 0  Pain scale- 0   Review of systems- Review of Systems  Constitutional:  Negative for chills, fever, malaise/fatigue and weight loss.  HENT:  Negative for congestion, ear discharge and nosebleeds.   Eyes:  Negative for blurred vision.  Respiratory:  Negative for cough, hemoptysis, sputum production, shortness of breath and wheezing.   Cardiovascular:  Negative for chest pain, palpitations, orthopnea and claudication.  Gastrointestinal:  Negative for abdominal pain, blood in  stool, constipation, diarrhea, heartburn, melena, nausea and vomiting.  Genitourinary:  Negative for dysuria, flank pain, frequency, hematuria and urgency.  Musculoskeletal:  Negative for back pain, joint pain and myalgias.  Skin:  Negative for rash.  Neurological:  Negative for dizziness, tingling, focal weakness, seizures, weakness and headaches.  Endo/Heme/Allergies:  Does not bruise/bleed easily.  Psychiatric/Behavioral:  Negative for depression and suicidal ideas. The patient does not have insomnia.     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[place]) 08/08/2022   Hepatic steatosis 07/29/2022   Liver mass, right lobe 07/29/2022   Liver mass 07/23/2022   Tubular adenoma of colon 12/01/2020   Generalized anxiety disorder 01/30/2014   Migraine 01/09/2014   Vitamin D deficiency 10/17/2013   Encounter for preventive health examination 04/15/2013   Hyperlipidemia with target LDL less than 160 04/15/2013     Past Medical History:  Diagnosis Date   Atypical mole 03/08/2014   L distal ant lat thigh, excised 04/26/2014   History of chicken pox    Hx of dysplastic nevus    R tricep, txted in past per patient   Hx of dysplastic nevus 03/08/2014   R lower back, moderate atypia   Migraine    UTI (urinary tract infection)      Past Surgical History:  Procedure Laterality Date   CESAREAN SECTION  1992   COLONOSCOPY WITH PROPOFOL N/A 10/30/2020   Procedure: COLONOSCOPY WITH PROPOFOL;  Surgeon: Jonathon Bellows, MD;  Location: [facility] ENDOSCOPY;  Service: Gastroenterology;  Laterality: N/A;   DEBRIDEMENT TENNIS ELBOW  208   Right elbow    Social History   Socioeconomic History   Marital status: Married    Spouse name: Not on file   Number of children: Not on file   Years of education: Not on file   Highest education level: Not on file  Occupational History   Not on file   Tobacco Use   Smoking status: Never   Smokeless tobacco: Never  Vaping Use   Vaping Use: Never used  Substance and Sexual Activity   Alcohol use: Yes    Comment: none last 24hrs   Drug use: No   Sexual activity: Not on file  Other Topics Concern   Not on file  Social History Narrative   Not on file   Social Determinants of Health   Financial Resource Strain: Not on file  Food Insecurity: No Food Insecurity (07/09/2022)   Hunger Vital Sign    Worried About Running Out of Food in the Last Year: Never true    Ran Out of Food in the Last Year: Never true  Transportation Needs: No Transportation Needs (07/09/2022)   PRAPARE - Hydrologist (Medical): No    Lack of Transportation (Non-Medical): No  Physical Activity: Not on file  Stress: Not on file  Social Connections: Not on file  Intimate Partner Violence: Not At Risk (07/09/2022)   Humiliation, Afraid, Rape, and Kick questionnaire    Fear of Current or Ex-Partner: No    Emotionally Abused: No    Physically Abused: No    Sexually Abused: No     Family History  Problem Relation Age of Onset   Hyperlipidemia Mother    Hypertension Mother 80       Runs in mother's side of family   Cancer Father        bone   Heart attack Maternal Uncle 84   Heart attack Maternal Grandmother 76   Pulmonary fibrosis Maternal Grandmother    Heart attack Maternal Grandfather 59   Alzheimer's disease Paternal Grandmother    Pulmonary fibrosis Brother 47       idiopathic    Pulmonary fibrosis Paternal Uncle    Breast cancer Maternal Aunt        postmeno.     Current Outpatient Medications:    Cholecalciferol (VITAMIN D3) 1000 UNITS CHEW, Chew 2 tablets by mouth daily. , Disp: , Rfl:    citalopram (CELEXA) 10 MG tablet, TAKE 1 TABLET BY MOUTH EVERY DAY, Disp: 90 tablet, Rfl: 3   Red Yeast Rice 600 MG CAPS, Take 1 capsule by mouth 2 (two) times daily., Disp: , Rfl:    Physical exam: There were no vitals  filed for this visit. Physical Exam Constitutional:      General: She is not in acute distress. Cardiovascular:     Rate and Rhythm: Normal rate and regular rhythm.     Heart sounds: Normal heart sounds.  Pulmonary:     Effort: Pulmonary effort is normal.     Breath sounds: Normal breath sounds.  Abdominal:     General: Bowel sounds are normal.     Palpations: Abdomen is soft.     Comments: No palpable hepatosplenomegaly  Skin:    General: Skin is warm and dry.  Neurological:     Mental Status: She is alert and oriented to person, place, and time.           Latest Ref Rng & Units 07/24/2022   10:08 AM  CMP  Total Protein 6.0 - 8.3 g/dL 6.9   Total Bilirubin 0.2 - 1.2 mg/dL 0.6   Alkaline Phos 39 - 117 U/L 44 - 121 IU/L 96    110   AST 0 - 37 U/L 20   ALT 0 - 35 U/L 22       Latest Ref Rng & Units 07/24/2022   10:08 AM  CBC  WBC 4.0 - 10.5 K/uL 5.3   Hemoglobin 12.0 - 15.0 g/dL 13.7   Hematocrit 36.0 - 46.0 % 40.4   Platelets 150.0 - 400.0 K/uL 395.0     No images are attached to the encounter.  MR LIVER W WO CONTRAST  Result Date: 08/07/2022 CLINICAL DATA:  Further evaluation of subcapsular lesion in the right hepatic lobe seen on prior ultrasound. EXAM: MRI ABDOMEN WITHOUT AND WITH CONTRAST TECHNIQUE: Multiplanar multisequence MR imaging of the abdomen was performed both before and after the administration of intravenous contrast. CONTRAST:  78m GADAVIST GADOBUTROL 1 MMOL/ML IV SOLN COMPARISON:  CT July 09, 2022 and ultrasound July 18, 2022. FINDINGS: Despite efforts by the technologist and patient, motion artifact is present on today's exam and could not be eliminated. This reduces exam sensitivity and specificity. Lower chest: No acute abnormality. Hepatobiliary: Diffuse hepatic steatosis with linear and nodular focal fatty sparing along the gallbladder fossa. Scattered tiny hepatic cysts. No suspicious hepatic lesion. Gallbladder is unremarkable. No  biliary ductal dilation. Pancreas: No pancreatic ductal dilation or evidence of acute inflammation. Spleen:  No splenomegaly. Adrenals/Urinary Tract: Bilateral adrenal glands appear normal. No hydronephrosis. Kidneys demonstrate symmetric enhancement and excretion of contrast. Stomach/Bowel: Stomach is unremarkable for degree of distension. Limited evaluation of visualized portions of small and large bowel within the abdomen are unremarkable. Vascular/Lymphatic: Normal caliber abdominal aorta. The portal, splenic and superior mesenteric veins are patent. Smooth IVC contours. No pathologically enlarged abdominal lymph nodes. Other:  No significant abdominal free fluid. Musculoskeletal: No suspicious osseous lesion. Multilevel degenerative changes spine. IMPRESSION: Motion degraded examination. Within this context there is diffuse hepatic steatosis with linear and nodular focal fatty sparing along the gallbladder fossa. No suspicious hepatic lesion identified. Electronically Signed   By: JDahlia BailiffM.D.   On: 08/07/2022 13:54   UKoreaAbdomen Limited RUQ (LIVER/GB)  Result Date: 07/18/2022 CLINICAL DATA:  Lesion on prior CT. EXAM: ULTRASOUND ABDOMEN LIMITED RIGHT UPPER QUADRANT COMPARISON:  CT abdomen pelvis 07/09/2022 FINDINGS: Gallbladder: No gallstones or wall thickening visualized. No sonographic Murphy sign noted by sonographer. Common bile duct: Diameter: 4 mm Liver: Increased hepatic parenchymal echogenicity compatible with steatosis. Within the subcapsular right hepatic lobe there is a 2.2 x 1.9 x 0.8 cm echogenic mass. Additionally within the right hepatic lobe there is a 9 x 9 x 5 mm too small to characterize hypoechoic mass, likely a small cyst. Portal vein is patent on color Doppler imaging with normal direction of blood flow towards the liver. Other: None. IMPRESSION: 1. There is a 2.2 cm echogenic mass within the subcapsular right hepatic lobe. While this may represent hemangioma, it has an  indeterminate appearance by ultrasound. Recommend further evaluation with pre and post contrast-enhanced abdominal MRI. 2. Hepatic steatosis. 3. No cholelithiasis or sonographic evidence for acute cholecystitis. Electronically Signed   By: DLovey NewcomerM.D.   On: 07/18/2022 12:46    Assessment and plan- Patient is a 62y.o. female referred for abnormal hemochromatosis gene testing  Hemochromatosis gene testing was done due to evidence of fatty liver noted on imaging.  She was found to have heterozygosity for C282Y.  Iron studies showed normal ferritin level and increased iron saturation.  Liver enzymes are normal.  Discussed that hereditary hemochromatosis is a clinical condition leading to iron overload and is an autosomal recessive condition.  Patient has a single gene mutation in C282Y which typically does not lead to clinically significant iron overload.  Based on her labs and history patient does not have any evidence of iron overload and does not require phlebotomy.  With regards to abnormal imaging patientPatient was noted to have a possible 2.2 cm echogenic mass in the subcapsular right hepatic lobe.  She subsequently had an MRI liver with and without contrast which showed diffuse hepatic steatosis and nodular focal fatty sparing along the gallbladder fossa.  There was no liver lesion demonstrated.  I plan to follow-up on the 2.2 cm possible hemangioma with a repeat ultrasound in 6 months since there was motion degradation noted on her MRI.  MRI liver did not show any evidence of iron overload.  With regards to findings of fatty liver: Patient does not have any abnormal LFTs.  She can discuss GI referral with Dr. Derrel Nip for further workup of fatty liver if desired.  I will see her back in 6 months with labs   Thank you for this kind referral and the opportunity to participate in the care of this patient   Visit Diagnosis 1. 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 Fatty infiltration of liver     Dr. Randa Evens, MD, MPH [facility] at [facility] [phone] 08/16/2022

## 2022-08-16 NOTE — Progress Notes (Unsigned)
New patient referred by Dr Derrel Nip for possible hemochromatosis. Pt is scheduled to have appendectomy on Aug 29 2022.

## 2022-08-21 ENCOUNTER — Encounter
Admission: RE | Admit: 2022-08-21 | Discharge: 2022-08-21 | Disposition: A | Discharge status: Home / Self Care | Payer: BC Managed Care – PPO | Source: Ambulatory Visit | Attending: Surgery | Admitting: Surgery

## 2022-08-21 HISTORY — DX: Vitamin D deficiency, unspecified: E55.9

## 2022-08-21 HISTORY — DX: Fatty (change of) liver, not elsewhere classified: K76.0

## 2022-08-21 NOTE — Patient Instructions (Addendum)
Your procedure is scheduled on: Thursday, January 18 Report to the Registration Desk on the 1st floor of the [facility]. To find out your arrival time, please call [phone] between 1PM - 3PM on: Wednesday, January 17 If your arrival time is 6:00 am, do not arrive prior to that time as the [facility] entrance doors do not open until 6:00 am.  REMEMBER: Instructions that are not followed completely may result in serious medical risk, up to and including death; or upon the discretion of your surgeon and anesthesiologist your surgery may need to be rescheduled.  Do not eat food after midnight the night before surgery.  No gum chewing, lozengers or hard candies.  You may however, drink CLEAR liquids up to 2 hours before you are scheduled to arrive for your surgery. Do not drink anything within 2 hours of your scheduled arrival time.  Clear liquids include: - water  - apple juice without pulp - gatorade (not RED colors) - black coffee or tea (Do NOT add milk or creamers to the coffee or tea) Do NOT drink anything that is not on this list.  TAKE THESE MEDICATIONS THE MORNING OF SURGERY WITH A SIP OF WATER:  Celexa  One week prior to surgery: starting January 11 Stop Anti-inflammatories (NSAIDS) such as Advil, Aleve, Ibuprofen, Motrin, Naproxen, Naprosyn and Aspirin based products such as Excedrin, Goodys Powder, BC Powder. Stop ANY OVER THE COUNTER supplements until after surgery. Stop red yeast rice You may however, continue to take Tylenol if needed for pain up until the day of surgery.  No Alcohol for 24 hours before or after surgery.  No Smoking including e-cigarettes for 24 hours prior to surgery.  No chewable tobacco products for at least 6 hours prior to surgery.  No nicotine patches on the day of surgery.  Do not use any "recreational" drugs for at least a week prior to your surgery.  Please be advised that the combination of cocaine and anesthesia may have negative  outcomes, up to and including death. If you test positive for cocaine, your surgery will be cancelled.  On the morning of surgery brush your teeth with toothpaste and water, you may rinse your mouth with mouthwash if you wish. Do not swallow any toothpaste or mouthwash.  Use CHG Soap as directed on instruction sheet.  Do not wear jewelry, make-up, hairpins, clips or nail polish.  Do not wear lotions, powders, or perfumes.   Do not shave body from the neck down 48 hours prior to surgery just in case you cut yourself which could leave a site for infection.  Also, freshly shaved skin may become irritated if using the CHG soap.  Contact lenses, hearing aids and dentures may not be worn into surgery.  Do not bring valuables to the hospital. [facility] is not responsible for any missing/lost belongings or valuables.   Notify your doctor if there is any change in your medical condition (cold, fever, infection).  Wear comfortable clothing (specific to your surgery type) to the hospital.  After surgery, you can help prevent lung complications by doing breathing exercises.  Take deep breaths and cough every 1-2 hours. Your doctor may order a device called an Incentive Spirometer to help you take deep breaths. When coughing or sneezing, hold a pillow firmly against your incision with both hands. This is called "splinting." Doing this helps protect your incision. It also decreases belly discomfort.  If you are being discharged the day of surgery, you will  not be allowed to drive home. You will need a responsible adult (18 years or older) to drive you home and stay with you that night.   If you are taking public transportation, you will need to have a responsible adult (18 years or older) with you. Please confirm with your physician that it is acceptable to use public transportation.   Please call the [place] Dept. at [phone] if you have any questions about these  instructions.  Surgery Visitation Policy:  Patients undergoing a surgery or procedure may have two family members or support persons with them as long as the person is not COVID-19 positive or experiencing its symptoms.     Preparing for Surgery with CHLORHEXIDINE GLUCONATE (CHG) Soap  Chlorhexidine Gluconate (CHG) Soap  o An antiseptic cleaner that kills germs and bonds with the skin to continue killing germs even after washing  o Used for showering the night before surgery and morning of surgery  Before surgery, you can play an important role by reducing the number of germs on your skin.  CHG (Chlorhexidine gluconate) soap is an antiseptic cleanser which kills germs and bonds with the skin to continue killing germs even after washing.  Please do not use if you have an allergy to CHG or antibacterial soaps. If your skin becomes reddened/irritated stop using the CHG.  1. Shower the NIGHT BEFORE SURGERY and the MORNING OF SURGERY with CHG soap.  2. If you choose to wash your hair, wash your hair first as usual with your normal shampoo.  3. After shampooing, rinse your hair and body thoroughly to remove the shampoo.  4. Use CHG as you would any other liquid soap. You can apply CHG directly to the skin and wash gently with a scrungie or a clean washcloth.  5. Apply the CHG soap to your body only from the neck down. Do not use on open wounds or open sores. Avoid contact with your eyes, ears, mouth, and genitals (private parts). Wash face and genitals (private parts) with your normal soap.  6. Wash thoroughly, paying special attention to the area where your surgery will be performed.  7. Thoroughly rinse your body with warm water.  8. Do not shower/wash with your normal soap after using and rinsing off the CHG soap.  9. Pat yourself dry with a clean towel.  10. Wear clean pajamas to bed the night before surgery.  12. Place clean sheets on your bed the night of your first shower  and do not sleep with pets.  13. Shower again with the CHG soap on the day of surgery prior to arriving at the hospital.  14. Do not apply any deodorants/lotions/powders.  15. Please wear clean clothes to the hospital.

## 2022-08-28 MED ORDER — ORAL CARE MOUTH RINSE: 15.0000 mL | Freq: Once | OROMUCOSAL | Status: AC

## 2022-08-28 MED ORDER — ACETAMINOPHEN 500 MG PO TABS: 1000.0000 mg | ORAL_TABLET | ORAL | Status: AC

## 2022-08-28 MED ORDER — CHLORHEXIDINE GLUCONATE CLOTH 2 % EX PADS: 6.0000 | MEDICATED_PAD | Freq: Once | CUTANEOUS | Status: DC

## 2022-08-28 MED ORDER — FAMOTIDINE 20 MG PO TABS: 20.0000 mg | ORAL_TABLET | Freq: Once | ORAL | Status: AC

## 2022-08-28 MED ORDER — CHLORHEXIDINE GLUCONATE 0.12 % MT SOLN: 15.0000 mL | Freq: Once | OROMUCOSAL | Status: AC

## 2022-08-28 MED ORDER — SODIUM CHLORIDE 0.9 % IV SOLN
2.0000 g | INTRAVENOUS | Status: AC
  Administered 2022-08-29: 2 g via INTRAVENOUS

## 2022-08-28 MED ORDER — LACTATED RINGERS IV SOLN: INTRAVENOUS | Status: DC

## 2022-08-28 MED ORDER — GABAPENTIN 300 MG PO CAPS: 300.0000 mg | ORAL_CAPSULE | ORAL | Status: AC

## 2022-08-28 MED ORDER — CELECOXIB 200 MG PO CAPS: 200.0000 mg | ORAL_CAPSULE | ORAL | Status: AC

## 2022-08-29 ENCOUNTER — Encounter
Admission: RE | Disposition: A | Discharge status: Home / Self Care | Payer: Self-pay | Source: Ambulatory Visit | Attending: Surgery

## 2022-08-29 ENCOUNTER — Other Ambulatory Visit: Payer: Self-pay

## 2022-08-29 ENCOUNTER — Ambulatory Visit: Payer: BC Managed Care – PPO | Admitting: Certified Registered"

## 2022-08-29 ENCOUNTER — Encounter: Payer: Self-pay | Admitting: Surgery

## 2022-08-29 ENCOUNTER — Ambulatory Visit
Admission: RE | Admit: 2022-08-29 | Discharge: 2022-08-29 | Disposition: A | Discharge status: Home / Self Care | Payer: BC Managed Care – PPO | Source: Ambulatory Visit | Attending: Surgery | Admitting: Surgery

## 2022-08-29 DIAGNOSIS — K760 Fatty (change of) liver, not elsewhere classified: Secondary | ICD-10-CM | POA: Diagnosis present

## 2022-08-29 DIAGNOSIS — K3532 Acute appendicitis with perforation and localized peritonitis, without abscess: Secondary | ICD-10-CM | POA: Insufficient documentation

## 2022-08-29 HISTORY — PX: LAPAROSCOPIC APPENDECTOMY: SHX408

## 2022-08-29 HISTORY — PX: LIVER BIOPSY: SHX301

## 2022-08-29 SURGERY — APPENDECTOMY, LAPAROSCOPIC
Anesthesia: General

## 2022-08-29 MED ORDER — HYDROMORPHONE HCL 1 MG/ML IJ SOLN
INTRAMUSCULAR | Status: DC | PRN
  Administered 2022-08-29: 1 mg via INTRAVENOUS

## 2022-08-29 MED ORDER — DEXAMETHASONE SODIUM PHOSPHATE 10 MG/ML IJ SOLN
INTRAMUSCULAR | Status: DC | PRN
  Administered 2022-08-29: 10 mg via INTRAVENOUS

## 2022-08-29 MED ORDER — MIDAZOLAM HCL 2 MG/2ML IJ SOLN
INTRAMUSCULAR | Status: AC
  Filled 2022-08-29: qty 2

## 2022-08-29 MED ORDER — BUPIVACAINE HCL (PF) 0.25 % IJ SOLN
INTRAMUSCULAR | Status: AC
  Filled 2022-08-29: qty 30

## 2022-08-29 MED ORDER — BUPIVACAINE-EPINEPHRINE (PF) 0.5% -1:200000 IJ SOLN
INTRAMUSCULAR | Status: AC
  Filled 2022-08-29: qty 30

## 2022-08-29 MED ORDER — HYDROMORPHONE HCL 1 MG/ML IJ SOLN
INTRAMUSCULAR | Status: AC
  Filled 2022-08-29: qty 1

## 2022-08-29 MED ORDER — SUGAMMADEX SODIUM 200 MG/2ML IV SOLN
INTRAVENOUS | Status: DC | PRN
  Administered 2022-08-29: 200 mg via INTRAVENOUS

## 2022-08-29 MED ORDER — BUPIVACAINE HCL 0.25 % IJ SOLN
INTRAMUSCULAR | Status: DC | PRN
  Administered 2022-08-29: 30 mL via INTRAMUSCULAR

## 2022-08-29 MED ORDER — LIDOCAINE HCL (CARDIAC) PF 100 MG/5ML IV SOSY
PREFILLED_SYRINGE | INTRAVENOUS | Status: DC | PRN
  Administered 2022-08-29: 100 mg via INTRAVENOUS

## 2022-08-29 MED ORDER — MIDAZOLAM HCL 2 MG/2ML IJ SOLN
INTRAMUSCULAR | Status: DC | PRN
  Administered 2022-08-29: 2 mg via INTRAVENOUS

## 2022-08-29 MED ORDER — FENTANYL CITRATE (PF) 100 MCG/2ML IJ SOLN
INTRAMUSCULAR | Status: AC
  Filled 2022-08-29: qty 2

## 2022-08-29 MED ORDER — FAMOTIDINE 20 MG PO TABS
ORAL_TABLET | ORAL | Status: AC
  Administered 2022-08-29: 20 mg via ORAL
  Filled 2022-08-29: qty 1

## 2022-08-29 MED ORDER — PROPOFOL 10 MG/ML IV BOLUS
INTRAVENOUS | Status: DC | PRN
  Administered 2022-08-29: 100 mg via INTRAVENOUS

## 2022-08-29 MED ORDER — BUPIVACAINE LIPOSOME 1.3 % IJ SUSP
INTRAMUSCULAR | Status: DC | PRN
  Administered 2022-08-29: 20 mL

## 2022-08-29 MED ORDER — OXYCODONE HCL 5 MG/5ML PO SOLN: 5.0000 mg | Freq: Once | ORAL | Status: DC | PRN

## 2022-08-29 MED ORDER — BUPIVACAINE LIPOSOME 1.3 % IJ SUSP
INTRAMUSCULAR | Status: AC
  Filled 2022-08-29: qty 20

## 2022-08-29 MED ORDER — ACETAMINOPHEN 500 MG PO TABS
ORAL_TABLET | ORAL | Status: AC
  Administered 2022-08-29: 1000 mg via ORAL
  Filled 2022-08-29: qty 2

## 2022-08-29 MED ORDER — GABAPENTIN 300 MG PO CAPS
ORAL_CAPSULE | ORAL | Status: AC
  Administered 2022-08-29: 300 mg via ORAL
  Filled 2022-08-29: qty 1

## 2022-08-29 MED ORDER — OXYCODONE HCL 5 MG PO TABS: 5.0000 mg | ORAL_TABLET | Freq: Once | ORAL | Status: DC | PRN

## 2022-08-29 MED ORDER — FENTANYL CITRATE (PF) 100 MCG/2ML IJ SOLN
INTRAMUSCULAR | Status: DC | PRN
  Administered 2022-08-29: 100 ug via INTRAVENOUS

## 2022-08-29 MED ORDER — HYDROCODONE-ACETAMINOPHEN 5-325 MG PO TABS: 1.0000 | ORAL_TABLET | Freq: Four times a day (QID) | ORAL | 0 refills | Status: DC | PRN

## 2022-08-29 MED ORDER — ONDANSETRON HCL 4 MG/2ML IJ SOLN
INTRAMUSCULAR | Status: DC | PRN
  Administered 2022-08-29: 4 mg via INTRAVENOUS

## 2022-08-29 MED ORDER — CELECOXIB 200 MG PO CAPS
ORAL_CAPSULE | ORAL | Status: AC
  Administered 2022-08-29: 200 mg via ORAL
  Filled 2022-08-29: qty 1

## 2022-08-29 MED ORDER — ROCURONIUM BROMIDE 100 MG/10ML IV SOLN
INTRAVENOUS | Status: DC | PRN
  Administered 2022-08-29: 10 mg via INTRAVENOUS
  Administered 2022-08-29: 50 mg via INTRAVENOUS

## 2022-08-29 MED ORDER — SODIUM CHLORIDE 0.9 % IV SOLN
INTRAVENOUS | Status: AC
  Filled 2022-08-29: qty 2

## 2022-08-29 MED ORDER — CHLORHEXIDINE GLUCONATE 0.12 % MT SOLN
OROMUCOSAL | Status: AC
  Administered 2022-08-29: 15 mL via OROMUCOSAL
  Filled 2022-08-29: qty 15

## 2022-08-29 MED ORDER — EPHEDRINE SULFATE (PRESSORS) 50 MG/ML IJ SOLN
INTRAMUSCULAR | Status: DC | PRN
  Administered 2022-08-29 (×2): 10 mg via INTRAVENOUS

## 2022-08-29 MED ORDER — FENTANYL CITRATE (PF) 100 MCG/2ML IJ SOLN: 25.0000 ug | INTRAMUSCULAR | Status: DC | PRN

## 2022-08-29 SURGICAL SUPPLY — 45 items
ADH SKN CLS APL DERMABOND .7 (GAUZE/BANDAGES/DRESSINGS) ×1
APPLIER CLIP 5 13 M/L LIGAMAX5 (MISCELLANEOUS)
APR CLP MED LRG 5 ANG JAW (MISCELLANEOUS)
BLADE CLIPPER SURG (BLADE) ×1 IMPLANT
CLIP APPLIE 5 13 M/L LIGAMAX5 (MISCELLANEOUS) IMPLANT
CUTTER FLEX LINEAR 45M (STAPLE) ×1 IMPLANT
DERMABOND ADVANCED .7 DNX12 (GAUZE/BANDAGES/DRESSINGS) ×1 IMPLANT
ELECT CAUTERY BLADE 6.4 (BLADE) ×1 IMPLANT
ELECT CAUTERY BLADE TIP 2.5 (TIP) ×1
ELECT REM PT RETURN 9FT ADLT (ELECTROSURGICAL) ×1
ELECTRODE CAUTERY BLDE TIP 2.5 (TIP) ×1 IMPLANT
ELECTRODE REM PT RTRN 9FT ADLT (ELECTROSURGICAL) ×1 IMPLANT
GLOVE BIO SURGEON STRL SZ7 (GLOVE) ×1 IMPLANT
GOWN STRL REUS W/ TWL LRG LVL3 (GOWN DISPOSABLE) ×2 IMPLANT
GOWN STRL REUS W/TWL LRG LVL3 (GOWN DISPOSABLE) ×5
IRRIGATION STRYKERFLOW (MISCELLANEOUS) ×1 IMPLANT
IRRIGATOR STRYKERFLOW (MISCELLANEOUS) ×1
IV NS 1000ML (IV SOLUTION) ×1
IV NS 1000ML BAXH (IV SOLUTION) ×1 IMPLANT
L-HOOK LAP DISP 36CM (ELECTROSURGICAL) ×1
LHOOK LAP DISP 36CM (ELECTROSURGICAL) IMPLANT
MANIFOLD NEPTUNE II (INSTRUMENTS) ×1 IMPLANT
NEEDLE HYPO 22GX1.5 SAFETY (NEEDLE) ×1 IMPLANT
NS IRRIG 500ML POUR BTL (IV SOLUTION) ×1 IMPLANT
PACK LAP CHOLECYSTECTOMY (MISCELLANEOUS) ×1 IMPLANT
PENCIL SMOKE EVACUATOR (MISCELLANEOUS) ×1 IMPLANT
RELOAD 45 VASCULAR/THIN (ENDOMECHANICALS) ×1 IMPLANT
RELOAD STAPLE 45 2.5 WHT GRN (ENDOMECHANICALS) ×1 IMPLANT
RELOAD STAPLE 45 3.5 BLU ETS (ENDOMECHANICALS) ×1 IMPLANT
RELOAD STAPLE TA45 3.5 REG BLU (ENDOMECHANICALS) ×2 IMPLANT
SCISSORS METZENBAUM CVD 33 (INSTRUMENTS) IMPLANT
SHEARS HARMONIC ACE PLUS 36CM (ENDOMECHANICALS) ×1 IMPLANT
SLEEVE Z-THREAD 5X100MM (TROCAR) ×1 IMPLANT
SPONGE T-LAP 18X18 ~~LOC~~+RFID (SPONGE) ×1 IMPLANT
SUT MNCRL AB 4-0 PS2 18 (SUTURE) ×1 IMPLANT
SUT VICRYL 0 UR6 27IN ABS (SUTURE) ×2 IMPLANT
SYR 20ML LL LF (SYRINGE) ×1 IMPLANT
SYS BAG RETRIEVAL 10MM (BASKET) ×1
SYSTEM BAG RETRIEVAL 10MM (BASKET) ×1 IMPLANT
TRAP FLUID SMOKE EVACUATOR (MISCELLANEOUS) ×1 IMPLANT
TRAY FOLEY MTR SLVR 16FR STAT (SET/KITS/TRAYS/PACK) ×1 IMPLANT
TROCAR XCEL BLUNT TIP 100MML (ENDOMECHANICALS) ×1 IMPLANT
TROCAR XCEL NON-BLD 5MMX100MML (ENDOMECHANICALS) ×1 IMPLANT
TUBING EVAC SMOKE HEATED PNEUM (TUBING) ×1 IMPLANT
WATER STERILE IRR 500ML POUR (IV SOLUTION) ×1 IMPLANT

## 2022-08-29 NOTE — Op Note (Signed)
laparascopic appendectomy with laparoscopic liver biopsy  Kristine Mcguire Date of operation:  08/29/2022  Indications: The patient presented with a history of  abdominal pain. Workup has revealed findings consistent with acute appendicitis.  Pre-operative Diagnosis: Hx of perforated appendicitis, abnormal LFT  Post-operative Diagnosis: Same  Surgeon: Caroleen Hamman, MD, FACS  Anesthesia: General with endotracheal tube  Findings: Appendix with chronic inflammation, normal liver, no evidence of cirrhosis  Estimated Blood Loss: 5cc         Specimens: appendix         Complications:  none  Procedure Details  The patient was seen again in the preop area. The options of surgery versus observation were reviewed with the patient and/or family. The risks of bleeding, infection, recurrence of symptoms, negative laparoscopy, potential for an open procedure, bowel injury, abscess or infection, were all reviewed as well. The patient was taken to Operating Room, identified as Kristine Mcguire and the procedure verified as laparoscopic appendectomy. A Time Out was held and the above information confirmed.  The patient was placed in the supine position and general anesthesia was induced.  Antibiotic prophylaxis was administered and VT E prophylaxis was in place.  The abdomen was prepped and draped in a sterile fashion. An infraumbilical incision was made. A cutdown technique was used to enter the abdominal cavity. Two vicryl stitches were placed on the fascia and a Hasson trocar inserted. Pneumoperitoneum obtained. Two 5 mm ports were placed under direct visualization.   The appendix was identified and found to be inflamed and with significant adhesions to the pelvic wall , lysis of adhesions performed with suction irrigator.  The appendix was carefully dissected. The mesoappendix was divided with Harmonic scalpel. The base of the appendix was dissected out and divided with a standard load Endo  GIA.The appendix was placed in a Endo Catch bag and removed via the Hasson port.  Attention was turned to the RUQ , segment 3 of the liver was retracted and using a hook cautery a wedge biopsy of the liver was performed. Electrocautery used to obtain hemostasis  The right lower quadrant and pelvis was then irrigated with  normal saline which was aspirated. Inspection  failed to identify any additional bleeding and there were no signs of bowel injury.   Again the right lower quadrant was inspected there was no sign of bleeding or bowel injury therefore pneumoperitoneum was released, all ports were removed.  The umbilical fascia was closed with 0 Vicryl interrupted sutures and the skin incisions were approximated with subcuticular 4-0 Monocryl. Dermabond was placed The patient tolerated the procedure well, there were no complications. The sponge lap and needle count were correct at the end of the procedure.  The patient was taken to the recovery room in stable condition to be admitted for continued care.    Caroleen Hamman, MD FACS

## 2022-08-29 NOTE — Interval H&P Note (Signed)
History and Physical Interval Note:  08/29/2022 10:53 AM  Kristine Mcguire  has presented today for surgery, with the diagnosis of appendicitis.  The various methods of treatment have been discussed with the patient and family. After consideration of risks, benefits and other options for treatment, the patient has consented to  Procedure(s): APPENDECTOMY LAPAROSCOPIC (N/A) LIVER BIOPSY (N/A) as a surgical intervention.  The patient's history has been reviewed, patient examined, no change in status, stable for surgery.  I have reviewed the patient's chart and labs.  Questions were answered to the patient's satisfaction.     [place]

## 2022-08-29 NOTE — Transfer of Care (Signed)
Immediate Anesthesia Transfer of Care Note  Patient: Kristine Mcguire  Procedure(s) Performed: APPENDECTOMY LAPAROSCOPIC LIVER BIOPSY  Patient Location: PACU  Anesthesia Type:General  Level of Consciousness: drowsy  Airway & Oxygen Therapy: Patient connected to nasal cannula oxygen  Post-op Assessment: Report given to RN and Post -op Vital signs reviewed and stable  Post vital signs: stable  Last Vitals:  Vitals Value Taken Time  BP 134/60 08/29/22 1337  Temp    Pulse 73 08/29/22 1339  Resp 21 08/29/22 1339  SpO2 93 % 08/29/22 1339  Vitals shown include unvalidated device data.  Last Pain:  Vitals:   08/29/22 1118  TempSrc: Temporal  PainSc: 0-No pain         Complications: No notable events documented.

## 2022-08-29 NOTE — Anesthesia Procedure Notes (Signed)
Procedure Name: Intubation Date/Time: 08/29/2022 12:09 PM  Performed by: Aline Brochure, CRNAPre-anesthesia Checklist: Patient identified, Patient being monitored, Timeout performed, Emergency Drugs available and Suction available Patient Re-evaluated:Patient Re-evaluated prior to induction Oxygen Delivery Method: Circle system utilized Preoxygenation: Pre-oxygenation with 100% oxygen Induction Type: IV induction Ventilation: Mask ventilation without difficulty Laryngoscope Size: 3 and McGraph Grade View: Grade I Tube type: Oral Tube size: 7.0 mm Number of attempts: 1 Airway Equipment and Method: Stylet Placement Confirmation: ETT inserted through vocal cords under direct vision, positive ETCO2 and breath sounds checked- equal and bilateral Secured at: 21 cm Tube secured with: Tape Dental Injury: Teeth and Oropharynx as per pre-operative assessment

## 2022-08-29 NOTE — Anesthesia Preprocedure Evaluation (Addendum)
Anesthesia Evaluation  Patient identified by MRN, date of birth, ID band Patient awake    Reviewed: Allergy & Precautions, NPO status , Patient's Chart, lab work & pertinent test results  History of Anesthesia Complications Negative for: history of anesthetic complications  Airway Mallampati: III  TM Distance: <3 FB Neck ROM: full    Dental  (+) Chipped   Pulmonary neg pulmonary ROS, neg shortness of breath   Pulmonary exam normal        Cardiovascular Exercise Tolerance: Good (-) angina (-) Past MI negative cardio ROS Normal cardiovascular exam     Neuro/Psych  Headaches  negative psych ROS   GI/Hepatic negative GI ROS, Neg liver ROS,neg GERD  ,,  Endo/Other  negative endocrine ROS    Renal/GU      Musculoskeletal   Abdominal   Peds  Hematology negative hematology ROS (+)   Anesthesia Other Findings Past Medical History: 06/2022: Appendicitis 03/08/2014: Atypical mole     Comment:  L distal ant lat thigh, excised 04/26/2014 No date: Hepatic steatosis No date: History of chicken pox No date: Hx of dysplastic nevus     Comment:  R tricep, txted in past per patient 03/08/2014: Hx of dysplastic nevus     Comment:  R lower back, moderate atypia No date: Migraine No date: UTI (urinary tract infection) No date: Vitamin D deficiency  Past Surgical History: 11/05/1990: CESAREAN SECTION 2012: COLONOSCOPY 10/30/2020: COLONOSCOPY WITH PROPOFOL; N/A     Comment:  Procedure: COLONOSCOPY WITH PROPOFOL;  Surgeon: Jonathon Bellows, MD;  Location: [facility] ENDOSCOPY;  Service:               Gastroenterology;  Laterality: N/A; 12/2006: DEBRIDEMENT TENNIS ELBOW; Right     Comment:  Right elbow     Reproductive/Obstetrics negative OB ROS                             Anesthesia Physical Anesthesia Plan  ASA: 2  Anesthesia Plan: General ETT   Post-op Pain Management:    Induction:  Intravenous  PONV Risk Score and Plan: Ondansetron, Dexamethasone, Midazolam and Treatment may vary due to age or medical condition  Airway Management Planned: Oral ETT  Additional Equipment:   Intra-op Plan:   Post-operative Plan: Extubation in OR  Informed Consent: I have reviewed the patients History and Physical, chart, labs and discussed the procedure including the risks, benefits and alternatives for the proposed anesthesia with the patient or authorized representative who has indicated his/her understanding and acceptance.     Dental Advisory Given  Plan Discussed with: Anesthesiologist, CRNA and Surgeon  Anesthesia Plan Comments: (Patient consented for risks of anesthesia including but not limited to:  - adverse reactions to medications - damage to eyes, teeth, lips or other oral mucosa - nerve damage due to positioning  - sore throat or hoarseness - Damage to heart, brain, nerves, lungs, other parts of body or loss of life  Patient voiced understanding.)       Anesthesia Quick Evaluation

## 2022-08-29 NOTE — Discharge Instructions (Addendum)
AMBULATORY SURGERY  DISCHARGE INSTRUCTIONS   The drugs that you were given will stay in your system until tomorrow so for the next 24 hours you should not:  Drive an automobile Make any legal decisions Drink any alcoholic beverage   You may resume regular meals tomorrow.  Today it is better to start with liquids and gradually work up to solid foods.  You may eat anything you prefer, but it is better to start with liquids, then soup and crackers, and gradually work up to solid foods.   Please notify your doctor immediately if you have any unusual bleeding, trouble breathing, redness and pain at the surgery site, drainage, fever, or pain not relieved by medication.    Additional Instructions:        Please contact your physician with any problems or Same Day Surgery at [phone], Monday through Friday 6 am to 4 pm, or [place] at [facility] number at [phone].  Laparoscopic Appendectomy, Adult, Care After The following information offers guidance on how to care for yourself after your procedure. Your health care provider may also give you more specific instructions. If you have problems or questions, contact your health care provider. What can I expect after the procedure? After the procedure, it is common to have: Tiredness (fatigue). Mild pain in the incision areas. Constipation. This may be caused by taking pain medicines and being less active during recovery. Gas pain that can radiate to your shoulders. Follow these instructions at home: Medicines Take over-the-counter and prescription medicines only as told by your health care provider. Finish all antibiotic medicine even if you start to feel better. Take pain medicines before your pain becomes severe. Ask your health care provider if your condition or your medicine: Requires you to avoid driving or using machinery. Can cause constipation. You may need to take these actions to prevent or treat  constipation: Drink enough fluid to keep your urine pale yellow. Take over-the-counter or prescription medicines. Eat foods that are high in fiber, such as beans, whole grains, and fresh fruits and vegetables. Limit foods that are high in fat and processed sugars, such as fried or sweet foods. Incision care  Follow instructions from your health care provider about how to take care of your incisions. Make sure you: Wash your hands with soap and water for at least 20 seconds before and after you change your bandage (dressing). If soap and water are not available, use hand sanitizer. Change your dressing as told by your health care provider. Leave stitches (sutures), staples, skin glue, or adhesive strips in place. These skin closures may need to stay in place for 2 weeks or longer. If adhesive strip edges start to loosen and curl up, you may trim the loose edges. Do not remove adhesive strips completely unless your health care provider tells you to do that. Check your incision areas every day for signs of infection. Check for: Redness, swelling, or pain. Fluid or blood. Warmth. Pus or a bad smell. Bathing Do not take baths, swim, or use a hot tub until your health care provider approves. Ask your health care provider if you may take showers. You may only be allowed to take sponge baths. Keep your incisions clean and dry. Clean them as often as told by your health care provider. To do this: Gently wash the incisions with soap and water. Rinse the incisions with water to remove all soap. Pat the incisions dry with a clean towel. Do not rub the incisions.  Activity  If you were given a sedative during the procedure, it can affect you for several hours. Do not drive or operate machinery until your health care provider says that it is safe. Rest as told by your health care provider. Do not lift anything that is heavier than 10 lb (4.5 kg), or the limit that you are told, until your health care  provider says that it is safe. Do not play contact sports until your health care provider tells you that it is safe to do so. Return to your normal activities as told by your health care provider. Ask your health care provider what activities are safe for you. General instructions If you were sent home with a drain, follow instructions from your health care provider about how to care for it. Take deep breaths. This helps to prevent your lungs from developing an infection (pneumonia). If you need to cough or sneeze, place a pillow or blanket on your abdomen before you do so. This will help you control the pain. Keep all follow-up visits. This is important. Contact a health care provider if: You have redness, swelling, or pain around an incision. You have fluid or blood coming from an incision. An incision feels warm to the touch. You have pus or a bad smell coming from an incision or dressing. The edges of an incision break open after your sutures have been removed. You lose your appetite, keep feeling nauseous, or you are vomiting. You have diarrhea, or you cannot control your bowel functions. You develop a rash. Get help right away if: You have a fever. You have difficulty breathing. You have sharp pains in your chest. You develop swelling or pain in your legs. You faint. These symptoms may be an emergency. Get help right away. Call 911. Do not wait to see if the symptoms will go away. Do not drive yourself to the hospital. Summary After a laparoscopic appendectomy, it is common to have tiredness, mild pain in the area of the incisions, constipation, and gas pain. Follow your health care provider's instructions about caring for yourself after the procedure. Rest after the procedure. Return to your normal activities as told by your health care provider. Contact your health care provider if you notice signs of infection around your incisions. Get help right away if you develop a fever,  chest pain, or difficulty breathing. This information is not intended to replace advice given to you by your health care provider. Make sure you discuss any questions you have with your health care provider. Document Revised: 05/10/2021 Document Reviewed: 05/10/2021 Elsevier Patient Education  [place].

## 2022-08-30 ENCOUNTER — Encounter: Payer: Self-pay | Admitting: Surgery

## 2022-08-30 LAB — SURGICAL PATHOLOGY

## 2022-09-02 NOTE — Anesthesia Postprocedure Evaluation (Signed)
Anesthesia Post Note  Patient: Kristine Mcguire  Procedure(s) Performed: APPENDECTOMY LAPAROSCOPIC LIVER BIOPSY  Patient location during evaluation: PACU Anesthesia Type: General Level of consciousness: awake Pain management: satisfactory to patient Vital Signs Assessment: post-procedure vital signs reviewed and stable Respiratory status: spontaneous breathing and nonlabored ventilation Cardiovascular status: stable Anesthetic complications: no  There were no known notable events for this encounter.   Last Vitals:  Vitals:   08/29/22 1414 08/29/22 1428  BP: (!) 121/59 124/65  Pulse: 73 74  Resp: 14 17  Temp: (!) 36.2 C (!) 36.1 C  SpO2: 96% 99%    Last Pain:  Vitals:   08/30/22 0831  TempSrc:   PainSc: 2                  VAN STAVEREN,Daysia Vandenboom

## 2022-09-10 ENCOUNTER — Encounter: Payer: Self-pay | Admitting: Physician Assistant

## 2022-09-10 ENCOUNTER — Ambulatory Visit (INDEPENDENT_AMBULATORY_CARE_PROVIDER_SITE_OTHER): Payer: BC Managed Care – PPO | Admitting: Physician Assistant

## 2022-09-10 VITALS — BP 112/72 | HR 76 | Temp 98.2°F | Ht 65.0 in | Wt 147.0 lb

## 2022-09-10 DIAGNOSIS — Z09 Encounter for follow-up examination after completed treatment for conditions other than malignant neoplasm: Secondary | ICD-10-CM

## 2022-09-10 DIAGNOSIS — K3532 Acute appendicitis with perforation and localized peritonitis, without abscess: Secondary | ICD-10-CM

## 2022-09-10 NOTE — Progress Notes (Signed)
[facility] POST-OP OFFICE VISIT  09/10/2022  HPI: Kristine Mcguire is a 62 y.o. female 12 days s/p laparoscopic appendectomy with liver biopsy with Dr Dahlia Byes for acute appendicitis.   She has done well No abdominal pain, nausea, emesis, fever, nor chills Tolerating PO; no bowel issues Incisions are well healed Ambulating well No other complaints   Vital signs: BP 112/72   Pulse 76   Temp 98.2 F (36.8 C)   Ht '5\' 5"'$  (1.651 m)   Wt 147 lb (66.7 kg)   SpO2 98%   BMI 24.46 kg/m    Physical Exam: Constitutional: Well appearing female, NAD Abdomen: Soft, non-tender, non-distended, no rebound/guarding Skin: Laparoscopic incisions are healing well, no erythema or drainage   Assessment/Plan: This is a 62 y.o. female 12 days s/p laparoscopic appendectomy with liver biopsy with Dr Dahlia Byes for acute appendicitis.    - Pain control prn  - Reviewed wound care recommendation  - Reviewed lifting restrictions; 4 weeks total  - Reviewed surgical pathology    - Appendicitis    - Liver with steatosis; otherwise benign   - She can follow up on as needed basis; She understands to call with questions/concerns  -- Edison Simon, PA-C [facility] 09/10/2022, 2:14 PM M-F: 7am - 4pm

## 2022-09-10 NOTE — Patient Instructions (Signed)

## 2022-11-05 ENCOUNTER — Encounter: Payer: Self-pay | Admitting: Internal Medicine

## 2022-11-05 DIAGNOSIS — E785 Hyperlipidemia, unspecified: Secondary | ICD-10-CM

## 2022-11-05 DIAGNOSIS — D649 Anemia, unspecified: Secondary | ICD-10-CM

## 2022-11-05 DIAGNOSIS — R5383 Other fatigue: Secondary | ICD-10-CM

## 2022-11-05 DIAGNOSIS — R7301 Impaired fasting glucose: Secondary | ICD-10-CM

## 2022-11-13 ENCOUNTER — Other Ambulatory Visit: Payer: Self-pay | Admitting: Internal Medicine

## 2022-11-13 DIAGNOSIS — Z1231 Encounter for screening mammogram for malignant neoplasm of breast: Secondary | ICD-10-CM

## 2022-11-18 ENCOUNTER — Telehealth: Payer: BC Managed Care – PPO | Admitting: Physician Assistant

## 2022-11-18 DIAGNOSIS — R21 Rash and other nonspecific skin eruption: Secondary | ICD-10-CM

## 2022-11-18 NOTE — Progress Notes (Signed)
E Visit for Rash  We are sorry that you are not feeling well. Here is how we plan to help!  Based on what you shared with me it looks like you have contact dermatitis.  Contact dermatitis is a skin rash caused by something that touches the skin and causes irritation or inflammation.  Your skin may be red, swollen, dry, cracked, and itch.  The rash should go away in a few days but can last a few weeks.  If you get a rash, it's important to figure out what caused it so the irritant can be avoided in the future.  I recommend hydrocortisone cream for itch and irritation.  Can also take Benadryl as needed for itching if needed.  If no improvement or rash becomes worse follow up for in person evaluation in Urgent Care or with your PCP.   HOME CARE:  Take cool showers and avoid direct sunlight. Apply cool compress or wet dressings. Take a bath in an oatmeal bath.  Sprinkle content of one Aveeno packet under running faucet with comfortably warm water.  Bathe for 15-20 minutes, 1-2 times daily.  Pat dry with a towel. Do not rub the rash. Use hydrocortisone cream. Take an antihistamine like Benadryl for widespread rashes that itch.  The adult dose of Benadryl is 25-50 mg by mouth 4 times daily. Caution:  This type of medication may cause sleepiness.  Do not drink alcohol, drive, or operate dangerous machinery while taking antihistamines.  Do not take these medications if you have prostate enlargement.  Read package instructions thoroughly on all medications that you take.  GET HELP RIGHT AWAY IF:  Symptoms don't go away after treatment. Severe itching that persists. If you rash spreads or swells. If you rash begins to smell. If it blisters and opens or develops a yellow-brown crust. You develop a fever. You have a sore throat. You become short of breath.  MAKE SURE YOU:  Understand these instructions. Will watch your condition. Will get help right away if you are not doing well or get  worse.  Thank you for choosing an e-visit.  Your e-visit answers were reviewed by a board certified advanced clinical practitioner to complete your personal care plan. Depending upon the condition, your plan could have included both over the counter or prescription medications.  Please review your pharmacy choice. Make sure the pharmacy is open so you can pick up prescription now. If there is a problem, you may contact your provider through [employer] and have the prescription routed to another pharmacy.  Your safety is important to [place]. If you have drug allergies check your prescription carefully.   For the next 24 hours you can use MyChart to ask questions about today's visit, request a non-urgent call back, or ask for a work or school excuse. You will get an email in the next two days asking about your experience. I hope that your e-visit has been valuable and will speed your recovery.  I have spent 5 minutes in review of e-visit questionnaire, review and updating patient chart, medical decision making and response to patient.   Tylene Fantasia Ward, PA-C

## 2022-11-20 ENCOUNTER — Other Ambulatory Visit: Payer: Self-pay | Admitting: Internal Medicine

## 2022-11-25 ENCOUNTER — Other Ambulatory Visit (INDEPENDENT_AMBULATORY_CARE_PROVIDER_SITE_OTHER): Payer: BC Managed Care – PPO

## 2022-11-25 DIAGNOSIS — R7301 Impaired fasting glucose: Secondary | ICD-10-CM | POA: Diagnosis not present

## 2022-11-25 DIAGNOSIS — D649 Anemia, unspecified: Secondary | ICD-10-CM

## 2022-11-25 DIAGNOSIS — R5383 Other fatigue: Secondary | ICD-10-CM | POA: Diagnosis not present

## 2022-11-25 DIAGNOSIS — E785 Hyperlipidemia, unspecified: Secondary | ICD-10-CM | POA: Diagnosis not present

## 2022-11-25 LAB — COMPREHENSIVE METABOLIC PANEL
ALT: 28 U/L (ref 0–35)
AST: 23 U/L (ref 0–37)
Albumin: 4.3 g/dL (ref 3.5–5.2)
Alkaline Phosphatase: 96 U/L (ref 39–117)
BUN: 15 mg/dL (ref 6–23)
CO2: 26 mEq/L (ref 19–32)
Calcium: 9.4 mg/dL (ref 8.4–10.5)
Chloride: 106 mEq/L (ref 96–112)
Creatinine, Ser: 0.79 mg/dL (ref 0.40–1.20)
GFR: 79.89 mL/min (ref >60.00)
Glucose, Bld: 95 mg/dL (ref 70–99)
Potassium: 4.2 mEq/L (ref 3.5–5.1)
Sodium: 142 mEq/L (ref 135–145)
Total Bilirubin: 0.6 mg/dL (ref 0.2–1.2)
Total Protein: 6.5 g/dL (ref 6.0–8.3)

## 2022-11-25 LAB — CBC WITH DIFFERENTIAL/PLATELET
Basophils Absolute: 0 10*3/uL (ref 0.0–0.1)
Basophils Relative: 0.9 % (ref 0.0–3.0)
Eosinophils Absolute: 0.1 10*3/uL (ref 0.0–0.7)
Eosinophils Relative: 2 % (ref 0.0–5.0)
HCT: 40.8 % (ref 36.0–46.0)
Hemoglobin: 13.9 g/dL (ref 12.0–15.0)
Lymphocytes Relative: 42.9 % (ref 12.0–46.0)
Lymphs Abs: 2.3 10*3/uL (ref 0.7–4.0)
MCHC: 34 g/dL (ref 30.0–36.0)
MCV: 90.4 fl (ref 78.0–100.0)
Monocytes Absolute: 0.5 10*3/uL (ref 0.1–1.0)
Monocytes Relative: 9.3 % (ref 3.0–12.0)
Neutro Abs: 2.4 10*3/uL (ref 1.4–7.7)
Neutrophils Relative %: 44.9 % (ref 43.0–77.0)
Platelets: 325 10*3/uL (ref 150.0–400.0)
RBC: 4.52 Mil/uL (ref 3.87–5.11)
RDW: 13.5 % (ref 11.5–15.5)
WBC: 5.3 10*3/uL (ref 4.0–10.5)

## 2022-11-25 LAB — LIPID PANEL
Cholesterol: 288 mg/dL — ABNORMAL HIGH (ref 0–200)
HDL: 61.3 mg/dL (ref >39.00)
LDL Cholesterol: 195 mg/dL — ABNORMAL HIGH (ref 0–99)
NonHDL: 226.9
Total CHOL/HDL Ratio: 5
Triglycerides: 160 mg/dL — ABNORMAL HIGH (ref 0.0–149.0)
VLDL: 32 mg/dL (ref 0.0–40.0)

## 2022-11-25 LAB — HEMOGLOBIN A1C: Hgb A1c MFr Bld: 5.8 % (ref 4.6–6.5)

## 2022-11-25 LAB — LDL CHOLESTEROL, DIRECT: Direct LDL: 197 mg/dL

## 2022-11-25 LAB — TSH: TSH: 1.82 u[IU]/mL (ref 0.35–5.50)

## 2022-12-05 ENCOUNTER — Encounter: Payer: Self-pay | Admitting: Internal Medicine

## 2022-12-05 ENCOUNTER — Ambulatory Visit (INDEPENDENT_AMBULATORY_CARE_PROVIDER_SITE_OTHER): Payer: BC Managed Care – PPO | Admitting: Internal Medicine

## 2022-12-05 VITALS — BP 114/72 | HR 67 | Temp 98.0°F | Ht 65.0 in | Wt 150.4 lb

## 2022-12-05 DIAGNOSIS — Z0000 Encounter for general adult medical examination without abnormal findings: Secondary | ICD-10-CM

## 2022-12-05 DIAGNOSIS — K760 Fatty (change of) liver, not elsewhere classified: Secondary | ICD-10-CM

## 2022-12-05 DIAGNOSIS — R160 Hepatomegaly, not elsewhere classified: Secondary | ICD-10-CM

## 2022-12-05 DIAGNOSIS — E7800 Pure hypercholesterolemia, unspecified: Secondary | ICD-10-CM | POA: Diagnosis not present

## 2022-12-05 DIAGNOSIS — B354 Tinea corporis: Secondary | ICD-10-CM | POA: Diagnosis not present

## 2022-12-05 MED ORDER — CLOTRIMAZOLE 1 % EX CREA: 1.0000 | TOPICAL_CREAM | Freq: Two times a day (BID) | CUTANEOUS | 0 refills | Status: DC

## 2022-12-05 MED ORDER — ROSUVASTATIN CALCIUM 10 MG PO TABS: 10.0000 mg | ORAL_TABLET | Freq: Every day | ORAL | 0 refills | Status: DC

## 2022-12-05 MED ORDER — METFORMIN HCL ER 500 MG PO TB24: 500.0000 mg | ORAL_TABLET | Freq: Every day | ORAL | 1 refills | Status: DC

## 2022-12-05 NOTE — Assessment & Plan Note (Signed)

## 2022-12-05 NOTE — Progress Notes (Signed)
Patient ID: Kristine Mcguire, female    DOB: [birth year]  Age: 62 y.o. MRN: [id number]  The patient is here for annual preventive examination and management of other chronic and acute problems.   The risk factors are reflected in the social history.   The roster of all physicians providing medical care to patient - is listed in the Snapshot section of the chart.   Activities of daily living:  The patient is 100% independent in all ADLs: dressing, toileting, feeding as well as independent mobility   Home safety : The patient has smoke detectors in the home. They wear seatbelts.  There are no unsecured firearms at home. There is no violence in the home.    There is no risks for hepatitis, STDs or HIV. There is no   history of blood transfusion. They have no travel history to infectious disease endemic areas of the world.   The patient has seen their dentist in the last six month. They have seen their eye doctor in the last year. The patinet  denies slight hearing difficulty with regard to whispered voices and some television programs.  They have deferred audiologic testing in the last year.  They do not  have excessive sun exposure. Discussed the need for sun protection: hats, long sleeves and use of sunscreen if there is significant sun exposure.    Diet: the importance of a healthy diet is discussed. They do have a healthy diet.   The benefits of regular aerobic exercise were discussed. The patient  exercises  3 days per week  for  30 minutes.    Depression screen: there are no signs or vegative symptoms of depression- irritability, change in appetite, anhedonia, sadness/tearfullness.   The following portions of the patient's history were reviewed and updated as appropriate: allergies, current medications, past family history, past medical history,  past surgical history, past social history  and problem list.   Visual acuity was not assessed per patient preference since the patient has  regular follow up with an  ophthalmologist. Hearing and body mass index were assessed and reviewed.    During the course of the visit the patient was educated and counseled about appropriate screening and preventive services including : fall prevention , diabetes screening, nutrition counseling, colorectal cancer screening, and recommended immunizations.    Chief Complaint:   1)  s/p  appendectomy in  January after 2 months of atypical right sided  pain and low grade fevers that started in November   2) fatty liver confirmed with liver biopsy I n  January. .  Has met with Ashley Royalty  for suspcion of HH but she only has one gene mutation for [facility] and an elevated iron saturation   3) hemangioma:  the plan is to repeat U/s liver at 6 months and follow up with [facility]  4) round itchy patch on chest .  Present since last Fall,  intermittent ,  itches for 24 hours, .then stops .     Review of Symptoms  Patient denies headache, fevers, malaise, unintentional weight loss,  eye pain, sinus congestion and sinus pain, sore throat, dysphagia,  hemoptysis , cough, dyspnea, wheezing, chest pain, palpitations, orthopnea, edema, abdominal pain, nausea, melena, diarrhea, constipation, flank pain, dysuria, hematuria, urinary  Frequency, nocturia, numbness, tingling, seizures,  Focal weakness, Loss of consciousness,  Tremor, insomnia, depression, anxiety, and suicidal ideation.    Physical Exam:  BP 114/72   Pulse 67   Temp 98 F (36.7 C) (Oral)  Ht 5\' 5"  (1.651 m)   Wt 150 lb 6.4 oz (68.2 kg)   SpO2 97%   BMI 25.03 kg/m    Physical Exam Vitals reviewed.  Constitutional:      General: She is not in acute distress.    Appearance: Normal appearance. She is well-developed and normal weight. She is not ill-appearing, toxic-appearing or diaphoretic.  HENT:     Head: Normocephalic.     Right Ear: Tympanic membrane, ear canal and external ear normal. There is no impacted cerumen.     Left Ear: Tympanic  membrane, ear canal and external ear normal. There is no impacted cerumen.     Nose: Nose normal.     Mouth/Throat:     Mouth: Mucous membranes are moist.     Pharynx: Oropharynx is clear.  Eyes:     General: No scleral icterus.       Right eye: No discharge.        Left eye: No discharge.     Conjunctiva/sclera: Conjunctivae normal.     Pupils: Pupils are equal, round, and reactive to light.  Neck:     Thyroid: No thyromegaly.     Vascular: No carotid bruit or JVD.  Cardiovascular:     Rate and Rhythm: Normal rate and regular rhythm.     Heart sounds: Normal heart sounds.  Pulmonary:     Effort: Pulmonary effort is normal. No respiratory distress.     Breath sounds: Normal breath sounds.  Chest:  Breasts:    Breasts are symmetrical.     Right: Normal. No swelling, inverted nipple, mass, nipple discharge, skin change or tenderness.     Left: Normal. No swelling, inverted nipple, mass, nipple discharge, skin change or tenderness.  Abdominal:     General: Bowel sounds are normal.     Palpations: Abdomen is soft. There is no mass.     Tenderness: There is no abdominal tenderness. There is no guarding or rebound.  Musculoskeletal:        General: Normal range of motion.     Cervical back: Normal range of motion and neck supple.  Lymphadenopathy:     Cervical: No cervical adenopathy.     Upper Body:     Right upper body: No supraclavicular, axillary or pectoral adenopathy.     Left upper body: No supraclavicular, axillary or pectoral adenopathy.  Skin:    General: Skin is warm and dry.          Comments: Quarter sized annular macular lesion with distinct border   Neurological:     General: No focal deficit present.     Mental Status: She is alert and oriented to person, place, and time. Mental status is at baseline.  Psychiatric:        Mood and Affect: Mood normal.        Behavior: Behavior normal.        Thought Content: Thought content normal.        Judgment: Judgment  normal.    Assessment and Plan: Pure hypercholesterolemia -     Comprehensive metabolic panel; Future -     CK; Future  Tinea corporis Assessment & Plan: On chest wall . Present for the last year,  intermittently itchy, clotrimazole cream prescribed    Steatosis of liver Assessment & Plan: Management and prognosis Discussed in detail . Recommend metformin and Crestor initiation,  vaccination against Hep A and B    Encounter for preventive health examination Assessment & Plan:  age appropriate education and counseling updated, referrals for preventative services and immunizations addressed, dietary and smoking counseling addressed, most recent labs reviewed.  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 Not phenotypic    Liver mass Assessment & Plan: Hemangioma suspected.  Repeat U/s in 6 months    Other orders -     metFORMIN HCl ER; Take 1 tablet (500 mg total) by mouth daily with breakfast.  Dispense: 90 tablet; Refill: 1 -     Rosuvastatin Calcium; Take 1 tablet (10 mg total) by mouth daily.  Dispense: 90 tablet; Refill: 0 -     Clotrimazole; Apply 1 Application topically 2 (two) times daily.  Dispense: 30 g; Refill: 0    Return in about 6 months (around 06/06/2023).  Sherlene Shams, MD

## 2022-12-05 NOTE — Assessment & Plan Note (Signed)
Management and prognosis Discussed in detail . Recommend metformin and Crestor initiation,  vaccination against Hep A and B

## 2022-12-05 NOTE — Assessment & Plan Note (Signed)
Hemangioma suspected.  Repeat U/s in 6 months

## 2022-12-05 NOTE — Assessment & Plan Note (Signed)
With iron saturation elevation. Not phenotypic

## 2022-12-05 NOTE — Patient Instructions (Signed)
I recommend starting metformin and generic Crestor to manage fatty liver  Return in 3 weeks for surveillance labs   Please consider getting vaccinated for Hepatitis A and B (here or at your pharmacy)   Clotrimazole cream twice daily for the tinea corporis (ringworm)

## 2022-12-05 NOTE — Assessment & Plan Note (Addendum)
On chest wall . Present for the last year,  intermittently itchy, clotrimazole cream prescribed

## 2022-12-10 ENCOUNTER — Ambulatory Visit
Admission: RE | Admit: 2022-12-10 | Discharge: 2022-12-10 | Disposition: A | Discharge status: Home / Self Care | Payer: BC Managed Care – PPO | Source: Ambulatory Visit | Attending: Internal Medicine | Admitting: Internal Medicine

## 2022-12-10 DIAGNOSIS — Z1231 Encounter for screening mammogram for malignant neoplasm of breast: Secondary | ICD-10-CM | POA: Insufficient documentation

## 2022-12-18 ENCOUNTER — Telehealth: Payer: BC Managed Care – PPO | Admitting: Family Medicine

## 2022-12-18 DIAGNOSIS — B9689 Other specified bacterial agents as the cause of diseases classified elsewhere: Secondary | ICD-10-CM | POA: Diagnosis not present

## 2022-12-18 DIAGNOSIS — J019 Acute sinusitis, unspecified: Secondary | ICD-10-CM

## 2022-12-18 DIAGNOSIS — R059 Cough, unspecified: Secondary | ICD-10-CM

## 2022-12-18 MED ORDER — AMOXICILLIN-POT CLAVULANATE 875-125 MG PO TABS: 1.0000 | ORAL_TABLET | Freq: Two times a day (BID) | ORAL | 0 refills | Status: AC

## 2022-12-18 MED ORDER — BENZONATATE 100 MG PO CAPS: 100.0000 mg | ORAL_CAPSULE | Freq: Three times a day (TID) | ORAL | 0 refills | Status: DC | PRN

## 2022-12-18 NOTE — Patient Instructions (Addendum)
Kristine Mcguire, thank you for joining Freddy Finner, NP for today's virtual visit.  While this provider is not your primary care provider (PCP), if your PCP is located in our provider database this encounter information will be shared with them immediately following your visit.   A [facility] MyChart account gives you access to today's visit and all your visits, tests, and labs performed at [facility] " click here if you don't have a [facility] MyChart account or go to [email or web address]  Consent: (Patient) Kristine Mcguire provided verbal consent for this virtual visit at the beginning of the encounter.  Current Medications:  Current Outpatient Medications:    amoxicillin-clavulanate (AUGMENTIN) 875-125 MG tablet, Take 1 tablet by mouth 2 (two) times daily for 7 days., Disp: 14 tablet, Rfl: 0   Cholecalciferol (VITAMIN D3) 1000 UNITS CHEW, Chew 2 tablets by mouth daily. , Disp: , Rfl:    citalopram (CELEXA) 10 MG tablet, TAKE 1 TABLET BY MOUTH EVERY DAY, Disp: 90 tablet, Rfl: 3   clotrimazole (CLOTRIMAZOLE ANTI-FUNGAL) 1 % cream, Apply 1 Application topically 2 (two) times daily., Disp: 30 g, Rfl: 0   metFORMIN (GLUCOPHAGE-XR) 500 MG 24 hr tablet, Take 1 tablet (500 mg total) by mouth daily with breakfast., Disp: 90 tablet, Rfl: 1   rosuvastatin (CRESTOR) 10 MG tablet, Take 1 tablet (10 mg total) by mouth daily., Disp: 90 tablet, Rfl: 0   Medications ordered in this encounter:  Meds ordered this encounter  Medications   amoxicillin-clavulanate (AUGMENTIN) 875-125 MG tablet    Sig: Take 1 tablet by mouth 2 (two) times daily for 7 days.    Dispense:  14 tablet    Refill:  0    Order Specific Question:   Supervising Provider    Answer:   Merrilee Jansky [id number]     *If you need refills on other medications prior to your next appointment, please contact your pharmacy*  Follow-Up: Call back or seek an in-person evaluation if the symptoms worsen or if the  condition fails to improve as anticipated.  [facility] [phone]  Other Instructions   -Take meds as prescribed -Rest -Use a cool mist humidifier especially during the winter months when heat dries out the air. -Flonase - Use saline nose sprays frequently to help soothe nasal passages and promote drainage. -Saline irrigations of the nose can be very helpful if done frequently.             * 4X daily for 1 week*             * Use of a nettie pot can be helpful with this.  *Follow directions with this* *Boiled or distilled water only -stay hydrated by drinking plenty of fluids - Keep thermostat turn down low to prevent drying out sinuses - For any cough or congestion- robitussin DM or Delsym as needed - For fever or aches or pains- take tylenol or ibuprofen as directed on bottle             * for fevers greater than 101 orally you may alternate ibuprofen and tylenol every 3 hours.  If you do not improve you will need a follow up visit in person.                If you have been instructed to have an in-person evaluation today at a local Urgent Care facility, please use the link below. It will take you to a list of  all of our available [facility], including address, phone number and hours of operation. Please do not delay care.  [facility]  If you or a family member do not have a primary care provider, use the link below to schedule a visit and establish care. When you choose a [facility] primary care physician or advanced practice provider, you gain a long-term partner in health. Find a Primary Care Provider  Learn more about [facility]'s in-office and virtual care options: [facility]

## 2022-12-18 NOTE — Addendum Note (Signed)
Addended by: Vivaan Helseth S on: 12/18/2022 10:00 AM   Modules accepted: Orders  

## 2022-12-18 NOTE — Addendum Note (Signed)
Addended by: Warden Fillers on: 12/18/2022 10:00 AM   Modules accepted: Orders

## 2022-12-18 NOTE — Addendum Note (Signed)
Addended by: Warden Fillers on: 12/18/2022 09:59 AM   Modules accepted: Orders

## 2022-12-18 NOTE — Progress Notes (Signed)
Virtual Visit Consent   Kristine Mcguire, you are scheduled for a virtual visit with a [facility] provider today. Just as with appointments in the office, your consent must be obtained to participate. Your consent will be active for this visit and any virtual visit you may have with one of our providers in the next 365 days. If you have a MyChart account, a copy of this consent can be sent to you electronically.  As this is a virtual visit, video technology does not allow for your provider to perform a traditional examination. This may limit your provider's ability to fully assess your condition. If your provider identifies any concerns that need to be evaluated in person or the need to arrange testing (such as labs, EKG, etc.), we will make arrangements to do so. Although advances in technology are sophisticated, we cannot ensure that it will always work on either your end or our end. If the connection with a video visit is poor, the visit may have to be switched to a telephone visit. With either a video or telephone visit, we are not always able to ensure that we have a secure connection.  By engaging in this virtual visit, you consent to the provision of healthcare and authorize for your insurance to be billed (if applicable) for the services provided during this visit. Depending on your insurance coverage, you may receive a charge related to this service.  I need to obtain your verbal consent now. Are you willing to proceed with your visit today? Kristine Mcguire has provided verbal consent on 12/18/2022 for a virtual visit (video or telephone). Freddy Finner, NP  Date: 12/18/2022 11:05 AM  Virtual Visit via Video Note   I, Freddy Finner, connected with  Kristine Mcguire  ([id number], 09-05-1960) on 12/18/22 at 11:00 AM EDT by a video-enabled telemedicine application and verified that I am speaking with the correct person using two identifiers.  Location: Patient: Virtual Visit Location  Patient: Home Provider: Virtual Visit Location Provider: Home Office   I discussed the limitations of evaluation and management by telemedicine and the availability of in person appointments. The patient expressed understanding and agreed to proceed.    History of Present Illness: Kristine Mcguire is a 62 y.o. who identifies as a female who was assigned female at birth, and is being seen today for head cold  Onset was started 8 days ago with sore throat that moved into sinus Associated symptoms are congestion, stuffy, sinus pressure, cough and headache and pressure, eyes are drainage, mucus nasal drainage, ear muffled Modifying factors are 3 bottles of mucinex, sudafed, benadryl , tylenol and advil rotating, saline- Denies chest pain, shortness of breath, fevers, chills  Exposure to sick contacts- unknown Recent travel a week prior to onset   Problems:  Patient Active Problem List   Diagnosis Date Noted   Tinea corporis 12/05/2022   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 , Disp: , Rfl:    citalopram (CELEXA) 10 MG  tablet, TAKE 1 TABLET BY MOUTH EVERY DAY, Disp: 90 tablet, Rfl: 3   clotrimazole (CLOTRIMAZOLE ANTI-FUNGAL) 1 % cream, Apply 1 Application topically 2 (two) times daily., Disp: 30 g, Rfl: 0   metFORMIN (GLUCOPHAGE-XR) 500 MG 24 hr tablet, Take 1 tablet (500 mg total) by mouth daily with breakfast., Disp: 90 tablet, Rfl: 1   rosuvastatin (CRESTOR) 10 MG  tablet, Take 1 tablet (10 mg total) by mouth daily., Disp: 90 tablet, Rfl: 0  Observations/Objective: Patient is well-developed, well-nourished in no acute distress.  Resting comfortably  at home.  Head is normocephalic, atraumatic.  No labored breathing.  Speech is clear and coherent with logical content.  Patient is alert and oriented at baseline.  Cough and congestion  Assessment and Plan:  1. Acute bacterial sinusitis  - amoxicillin-clavulanate (AUGMENTIN) 875-125 MG tablet; Take 1 tablet by mouth 2 (two) times daily for 7 days.  Dispense: 14 tablet; Refill: 0 - benzonatate (TESSALON) 100 MG capsule; Take 1 capsule (100 mg total) by mouth 3 (three) times daily as needed for cough.  Dispense: 20 capsule; Refill: 0  2. Cough in adult  - benzonatate (TESSALON) 100 MG capsule; Take 1 capsule (100 mg total) by mouth 3 (three) times daily as needed for cough.  Dispense: 20 capsule; Refill: 0  -Take meds as prescribed -Rest -Use a cool mist humidifier especially during the winter months when heat dries out the air. -Flonase - Use saline nose sprays frequently to help soothe nasal passages and promote drainage. -Saline irrigations of the nose can be very helpful if done frequently.             * 4X daily for 1 week*             * Use of a nettie pot can be helpful with this.  *Follow directions with this* *Boiled or distilled water only -stay hydrated by drinking plenty of fluids - Keep thermostat turn down low to prevent drying out sinuses - For any cough or congestion- robitussin DM or Delsym as needed - For fever or aches or pains- take tylenol or ibuprofen as directed on bottle             * for fevers greater than 101 orally you may alternate ibuprofen and tylenol every 3 hours.  If you do not improve you will need a follow up visit in person.               Reviewed side effects, risks and benefits of medication.    Patient acknowledged agreement and understanding of the  plan.   Past Medical, Surgical, Social History, Allergies, and Medications have been Reviewed.     Follow Up Instructions: I discussed the assessment and treatment plan with the patient. The patient was provided an opportunity to ask questions and all were answered. The patient agreed with the plan and demonstrated an understanding of the instructions.  A copy of instructions were sent to the patient via MyChart unless otherwise noted below.    The patient was advised to call back or seek an in-person evaluation if the symptoms worsen or if the condition fails to improve as anticipated.  Time:  I spent 10 minutes with the patient via telehealth technology discussing the above problems/concerns.    Freddy Finner, NP

## 2022-12-27 ENCOUNTER — Other Ambulatory Visit (INDEPENDENT_AMBULATORY_CARE_PROVIDER_SITE_OTHER): Payer: BC Managed Care – PPO

## 2022-12-27 DIAGNOSIS — E7800 Pure hypercholesterolemia, unspecified: Secondary | ICD-10-CM | POA: Diagnosis not present

## 2022-12-28 LAB — COMPREHENSIVE METABOLIC PANEL
ALT: 25 IU/L (ref 0–32)
AST: 23 IU/L (ref 0–40)
Albumin/Globulin Ratio: 2.2 (ref 1.2–2.2)
Albumin: 4.4 g/dL (ref 3.9–4.9)
Alkaline Phosphatase: 115 IU/L (ref 44–121)
BUN/Creatinine Ratio: 20 (ref 12–28)
BUN: 17 mg/dL (ref 8–27)
Bilirubin Total: 0.2 mg/dL (ref 0.0–1.2)
CO2: 22 mmol/L (ref 20–29)
Calcium: 9.5 mg/dL (ref 8.7–10.3)
Chloride: 104 mmol/L (ref 96–106)
Creatinine, Ser: 0.85 mg/dL (ref 0.57–1.00)
Globulin, Total: 2 g/dL (ref 1.5–4.5)
Glucose: 95 mg/dL (ref 70–99)
Potassium: 4 mmol/L (ref 3.5–5.2)
Sodium: 144 mmol/L (ref 134–144)
Total Protein: 6.4 g/dL (ref 6.0–8.5)
eGFR: 77 mL/min/{1.73_m2} (ref >59)

## 2022-12-28 LAB — CK: Total CK: 111 U/L (ref 32–182)

## 2023-01-07 ENCOUNTER — Ambulatory Visit
Admission: RE | Admit: 2023-01-07 | Discharge: 2023-01-07 | Disposition: A | Discharge status: Home / Self Care | Payer: BC Managed Care – PPO | Source: Ambulatory Visit | Attending: Oncology | Admitting: Oncology

## 2023-01-07 ENCOUNTER — Encounter: Payer: Self-pay | Admitting: Internal Medicine

## 2023-01-07 DIAGNOSIS — K760 Fatty (change of) liver, not elsewhere classified: Secondary | ICD-10-CM | POA: Diagnosis present

## 2023-01-08 ENCOUNTER — Ambulatory Visit (INDEPENDENT_AMBULATORY_CARE_PROVIDER_SITE_OTHER): Payer: BC Managed Care – PPO | Admitting: Dermatology

## 2023-01-08 VITALS — BP 114/69 | HR 69

## 2023-01-08 DIAGNOSIS — X32XXXA Exposure to sunlight, initial encounter: Secondary | ICD-10-CM

## 2023-01-08 DIAGNOSIS — L820 Inflamed seborrheic keratosis: Secondary | ICD-10-CM

## 2023-01-08 DIAGNOSIS — Z1283 Encounter for screening for malignant neoplasm of skin: Secondary | ICD-10-CM | POA: Diagnosis not present

## 2023-01-08 DIAGNOSIS — Z86018 Personal history of other benign neoplasm: Secondary | ICD-10-CM

## 2023-01-08 DIAGNOSIS — L578 Other skin changes due to chronic exposure to nonionizing radiation: Secondary | ICD-10-CM

## 2023-01-08 DIAGNOSIS — D492 Neoplasm of unspecified behavior of bone, soft tissue, and skin: Secondary | ICD-10-CM

## 2023-01-08 DIAGNOSIS — D229 Melanocytic nevi, unspecified: Secondary | ICD-10-CM

## 2023-01-08 DIAGNOSIS — L814 Other melanin hyperpigmentation: Secondary | ICD-10-CM

## 2023-01-08 DIAGNOSIS — L821 Other seborrheic keratosis: Secondary | ICD-10-CM

## 2023-01-08 DIAGNOSIS — D1801 Hemangioma of skin and subcutaneous tissue: Secondary | ICD-10-CM

## 2023-01-08 DIAGNOSIS — W908XXA Exposure to other nonionizing radiation, initial encounter: Secondary | ICD-10-CM

## 2023-01-08 NOTE — Patient Instructions (Addendum)
Cryotherapy Aftercare  Wash gently with soap and water everyday.   Apply Vaseline and Band-Aid daily until healed.    Wound Care Instructions  Cleanse wound gently with soap and water once a day then pat dry with clean gauze. Apply a thin coat of Petrolatum (petroleum jelly, "Vaseline") over the wound (unless you have an allergy to this). We recommend that you use a new, sterile tube of Vaseline. Do not pick or remove scabs. Do not remove the yellow or white "healing tissue" from the base of the wound.  Cover the wound with fresh, clean, nonstick gauze and secure with paper tape. You may use Band-Aids in place of gauze and tape if the wound is small enough, but would recommend trimming much of the tape off as there is often too much. Sometimes Band-Aids can irritate the skin.  You should call the office for your biopsy report after 1 week if you have not already been contacted.  If you experience any problems, such as abnormal amounts of bleeding, swelling, significant bruising, significant pain, or evidence of infection, please call the office immediately.  FOR ADULT SURGERY PATIENTS: If you need something for pain relief you may take 1 extra strength Tylenol (acetaminophen) AND 2 Ibuprofen (200mg each) together every 4 hours as needed for pain. (do not take these if you are allergic to them or if you have a reason you should not take them.) Typically, you may only need pain medication for 1 to 3 days.     Due to recent changes in healthcare laws, you may see results of your pathology and/or laboratory studies on MyChart before the doctors have had a chance to review them. We understand that in some cases there may be results that are confusing or concerning to you. Please understand that not all results are received at the same time and often the doctors may need to interpret multiple results in order to provide you with the best plan of care or course of treatment. Therefore, we ask that you  please give us 2 business days to thoroughly review all your results before contacting the office for clarification. Should we see a critical lab result, you will be contacted sooner.   If You Need Anything After Your Visit  If you have any questions or concerns for your doctor, please call our main line at [phone] and press option 4 to reach your doctor's medical assistant. If no one answers, please leave a voicemail as directed and we will return your call as soon as possible. Messages left after 4 pm will be answered the following business day.   You may also send us a message via MyChart. We typically respond to MyChart messages within 1-2 business days.  For prescription refills, please ask your pharmacy to contact our office. Our fax number is [phone].  If you have an urgent issue when the clinic is closed that cannot wait until the next business day, you can page your doctor at the number below.    Please note that while we do our best to be available for urgent issues outside of office hours, we are not available 24/7.   If you have an urgent issue and are unable to reach us, you may choose to seek medical care at your doctor's office, retail clinic, urgent care center, or emergency room.  If you have a medical emergency, please immediately call 911 or go to the emergency department.  Pager Numbers  - Dr. Kowalski: [phone]  -   Dr. Moye: [phone]  - Dr. Stewart: [phone]  In the event of inclement weather, please call our main line at [phone] for an update on the status of any delays or closures.  Dermatology Medication Tips: Please keep the boxes that topical medications come in in order to help keep track of the instructions about where and how to use these. Pharmacies typically print the medication instructions only on the boxes and not directly on the medication tubes.   If your medication is too expensive, please contact our office at  [phone] option 4 or send us a message through MyChart.   We are unable to tell what your co-pay for medications will be in advance as this is different depending on your insurance coverage. However, we may be able to find a substitute medication at lower cost or fill out paperwork to get insurance to cover a needed medication.   If a prior authorization is required to get your medication covered by your insurance company, please allow us 1-2 business days to complete this process.  Drug prices often vary depending on where the prescription is filled and some pharmacies may offer cheaper prices.  The website www.goodrx.com contains coupons for medications through different pharmacies. The prices here do not account for what the cost may be with help from insurance (it may be cheaper with your insurance), but the website can give you the price if you did not use any insurance.  - You can print the associated coupon and take it with your prescription to the pharmacy.  - You may also stop by our office during regular business hours and pick up a GoodRx coupon card.  - If you need your prescription sent electronically to a different pharmacy, notify our office through [facility] MyChart or by phone at [phone] option 4.     Si Usted Necesita Algo Despus de Su Visita  Tambin puede enviarnos un mensaje a travs de MyChart. Por lo general respondemos a los mensajes de MyChart en el transcurso de 1 a 2 das hbiles.  Para renovar recetas, por favor pida a su farmacia que se ponga en contacto con nuestra oficina. Nuestro nmero de fax es el [phone].  Si tiene un asunto urgente cuando la clnica est cerrada y que no puede esperar hasta el siguiente da hbil, puede llamar/localizar a su doctor(a) al nmero que aparece a continuacin.   Por favor, tenga en cuenta que aunque hacemos todo lo posible para estar disponibles para asuntos urgentes fuera del horario de oficina, no estamos  disponibles las 24 horas del da, los 7 das de la semana.   Si tiene un problema urgente y no puede comunicarse con nosotros, puede optar por buscar atencin mdica  en el consultorio de su doctor(a), en una clnica privada, en un centro de atencin urgente o en una sala de emergencias.  Si tiene una emergencia mdica, por favor llame inmediatamente al 911 o vaya a la sala de emergencias.  Nmeros de bper  - Dr. Kowalski: [phone]  - Dra. Moye: [phone]  - Dra. Stewart: [phone]  En caso de inclemencias del tiempo, por favor llame a nuestra lnea principal al [phone] para una actualizacin sobre el estado de cualquier retraso o cierre.  Consejos para la medicacin en dermatologa: Por favor, guarde las cajas en las que vienen los medicamentos de uso tpico para ayudarle a seguir las instrucciones sobre dnde y cmo usarlos. Las farmacias generalmente imprimen las instrucciones del medicamento slo en las cajas y   no directamente en los tubos del medicamento.   Si su medicamento es muy caro, por favor, pngase en contacto con nuestra oficina llamando al [phone] y presione la opcin 4 o envenos un mensaje a travs de MyChart.   No podemos decirle cul ser su copago por los medicamentos por adelantado ya que esto es diferente dependiendo de la cobertura de su seguro. Sin embargo, es posible que podamos encontrar un medicamento sustituto a menor costo o llenar un formulario para que el seguro cubra el medicamento que se considera necesario.   Si se requiere una autorizacin previa para que su compaa de seguros cubra su medicamento, por favor permtanos de 1 a 2 das hbiles para completar este proceso.  Los precios de los medicamentos varan con frecuencia dependiendo del lugar de dnde se surte la receta y alguna farmacias pueden ofrecer precios ms baratos.  El sitio web www.goodrx.com tiene cupones para medicamentos de diferentes farmacias. Los precios aqu no  tienen en cuenta lo que podra costar con la ayuda del seguro (puede ser ms barato con su seguro), pero el sitio web puede darle el precio si no utiliz ningn seguro.  - Puede imprimir el cupn correspondiente y llevarlo con su receta a la farmacia.  - Tambin puede pasar por nuestra oficina durante el horario de atencin regular y recoger una tarjeta de cupones de GoodRx.  - Si necesita que su receta se enve electrnicamente a una farmacia diferente, informe a nuestra oficina a travs de MyChart de [facility] o por telfono llamando al [phone] y presione la opcin 4.  

## 2023-01-08 NOTE — Progress Notes (Signed)
Follow-Up Visit   Subjective  Kristine Mcguire is a 62 y.o. female who presents for the following: Skin Cancer Screening and Full Body Skin Exam, hx of Dysplastic nevus.  The patient presents for Total-Body Skin Exam (TBSE) for skin cancer screening and mole check. The patient has spots, moles and lesions to be evaluated, some may be new or changing and the patient has concerns that these could be cancer.  The following portions of the chart were reviewed this encounter and updated as appropriate: medications, allergies, medical history  Review of Systems:  No other skin or systemic complaints except as noted in HPI or Assessment and Plan.  Objective  Well appearing patient in no apparent distress; mood and affect are within normal limits.  A full examination was performed including scalp, head, eyes, ears, nose, lips, neck, chest, axillae, abdomen, back, buttocks, bilateral upper extremities, bilateral lower extremities, hands, feet, fingers, toes, fingernails, and toenails. All findings within normal limits unless otherwise noted below.   Relevant physical exam findings are noted in the Assessment and Plan.  left superior sternum x 1 Stuck-on, waxy, tan-brown papule  --Discussed benign etiology and prognosis.   left medial heel 0.6 cm irregular brown macule        Assessment & Plan   LENTIGINES, SEBORRHEIC KERATOSES, HEMANGIOMAS - Benign normal skin lesions - Benign-appearing - Call for any changes  MELANOCYTIC NEVI - Tan-brown and/or pink-flesh-colored symmetric macules and papules - Benign appearing on exam today - Observation - Call clinic for new or changing moles - Recommend daily use of broad spectrum spf 30+ sunscreen to sun-exposed areas.   ACTINIC DAMAGE - Chronic condition, secondary to cumulative UV/sun exposure - diffuse scaly erythematous macules with underlying dyspigmentation - Recommend daily broad spectrum sunscreen SPF 30+ to sun-exposed areas,  reapply every 2 hours as needed.  - Staying in the shade or wearing long sleeves, sun glasses (UVA+UVB protection) and wide brim hats (4-inch brim around the entire circumference of the hat) are also recommended for sun protection.  - Call for new or changing lesions.  HISTORY OF DYSPLASTIC NEVUS No evidence of recurrence today Recommend regular full body skin exams Recommend daily broad spectrum sunscreen SPF 30+ to sun-exposed areas, reapply every 2 hours as needed.  Call if any new or changing lesions are noted between office visits   Inflamed seborrheic keratosis left superior sternum x 1  Symptomatic, irritating, patient would like treated.   Destruction of lesion - left superior sternum x 1 Complexity: simple   Destruction method: cryotherapy   Informed consent: discussed and consent obtained   Timeout:  patient name, date of birth, surgical site, and procedure verified Lesion destroyed using liquid nitrogen: Yes   Region frozen until ice ball extended beyond lesion: Yes   Outcome: patient tolerated procedure well with no complications   Post-procedure details: wound care instructions given    Neoplasm of skin left medial heel  Epidermal / dermal shaving  Lesion diameter (cm):  0.6 Informed consent: discussed and consent obtained   Timeout: patient name, date of birth, surgical site, and procedure verified   Procedure prep:  Patient was prepped and draped in usual sterile fashion Prep type:  Isopropyl alcohol Anesthesia: the lesion was anesthetized in a standard fashion   Anesthetic:  1% lidocaine w/ epinephrine 1-100,000 buffered w/ 8.4% NaHCO3 Hemostasis achieved with: pressure, aluminum chloride and electrodesiccation   Outcome: patient tolerated procedure well   Post-procedure details: sterile dressing applied and wound care instructions  given   Dressing type: bandage and petrolatum    Specimen 1 - Surgical pathology Differential Diagnosis: R/O Dysplastic nevus    Check Margins: No   SKIN CAnCER SCREENING PERFORMED TODAY.  Return in about 1 year (around 01/08/2024) for TBSE, hx of Dysplastic nevus .  IAngelique Holm, CMA, am acting as scribe for Armida Sans, MD .   Documentation: I have reviewed the above documentation for accuracy and completeness, and I agree with the above.  Armida Sans, MD

## 2023-01-15 ENCOUNTER — Encounter: Payer: Self-pay | Admitting: Dermatology

## 2023-01-15 ENCOUNTER — Inpatient Hospital Stay (HOSPITAL_BASED_OUTPATIENT_CLINIC_OR_DEPARTMENT_OTHER): Payer: BC Managed Care – PPO | Admitting: Oncology

## 2023-01-15 ENCOUNTER — Inpatient Hospital Stay: Payer: BC Managed Care – PPO | Attending: Oncology

## 2023-01-15 ENCOUNTER — Encounter: Payer: Self-pay | Admitting: Oncology

## 2023-01-15 ENCOUNTER — Telehealth: Payer: Self-pay

## 2023-01-15 DIAGNOSIS — K760 Fatty (change of) liver, not elsewhere classified: Secondary | ICD-10-CM | POA: Diagnosis not present

## 2023-01-15 LAB — CBC WITH DIFFERENTIAL/PLATELET
Abs Immature Granulocytes: 0.02 10*3/uL (ref 0.00–0.07)
Basophils Absolute: 0.1 10*3/uL (ref 0.0–0.1)
Basophils Relative: 1 %
Eosinophils Absolute: 0.2 10*3/uL (ref 0.0–0.5)
Eosinophils Relative: 2 %
HCT: 38.4 % (ref 36.0–46.0)
Hemoglobin: 13.4 g/dL (ref 12.0–15.0)
Immature Granulocytes: 0 %
Lymphocytes Relative: 28 %
Lymphs Abs: 2.1 10*3/uL (ref 0.7–4.0)
MCH: 30.7 pg (ref 26.0–34.0)
MCHC: 34.9 g/dL (ref 30.0–36.0)
MCV: 88.1 fL (ref 80.0–100.0)
Monocytes Absolute: 0.6 10*3/uL (ref 0.1–1.0)
Monocytes Relative: 9 %
Neutro Abs: 4.5 10*3/uL (ref 1.7–7.7)
Neutrophils Relative %: 60 %
Platelets: 278 10*3/uL (ref 150–400)
RBC: 4.36 MIL/uL (ref 3.87–5.11)
RDW: 12.1 % (ref 11.5–15.5)
WBC: 7.4 10*3/uL (ref 4.0–10.5)
nRBC: 0 % (ref 0.0–0.2)

## 2023-01-15 LAB — COMPREHENSIVE METABOLIC PANEL
ALT: 19 U/L (ref 0–44)
AST: 21 U/L (ref 15–41)
Albumin: 4.1 g/dL (ref 3.5–5.0)
Alkaline Phosphatase: 89 U/L (ref 38–126)
Anion gap: 7 (ref 5–15)
BUN: 15 mg/dL (ref 8–23)
CO2: 25 mmol/L (ref 22–32)
Calcium: 9.2 mg/dL (ref 8.9–10.3)
Chloride: 105 mmol/L (ref 98–111)
Creatinine, Ser: 0.76 mg/dL (ref 0.44–1.00)
GFR, Estimated: >60 mL/min (ref >60)
Glucose, Bld: 100 mg/dL — ABNORMAL HIGH (ref 70–99)
Potassium: 4 mmol/L (ref 3.5–5.1)
Sodium: 137 mmol/L (ref 135–145)
Total Bilirubin: 0.3 mg/dL (ref 0.3–1.2)
Total Protein: 6.7 g/dL (ref 6.5–8.1)

## 2023-01-15 LAB — IRON AND TIBC
Iron: 119 ug/dL (ref 28–170)
Saturation Ratios: 37 % — ABNORMAL HIGH (ref 10.4–31.8)
TIBC: 323 ug/dL (ref 250–450)
UIBC: 204 ug/dL

## 2023-01-15 LAB — FERRITIN: Ferritin: 65 ng/mL (ref 11–307)

## 2023-01-15 NOTE — Telephone Encounter (Signed)
-----   Message from Deirdre Evener, MD sent at 01/14/2023  8:30 PM EDT ----- Diagnosis Skin , left medial heel DYSPLASTIC JUNCTIONAL LENTIGINOUS NEVUS WITH MODERATE ATYPIA, LIMITED MARGINS FREE  Moderate dysplastic Recheck next visit

## 2023-01-15 NOTE — Telephone Encounter (Signed)
Left pt msg to call for bx results/sh 

## 2023-01-15 NOTE — Progress Notes (Signed)
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  She had undergone a CT abdomen and pelvis with contrast in November 2023 which showed fatty infiltration of the liver and low-density lesion in the inferior right hepatic lobe measuring 7 mm.  At that time there was also signs of acute appendicitis..  She then had a right upper quadrant ultrasound which showed a 2.2 cm echogenic mass in the right hepatic lobe which could be a hemangioma but was otherwise indeterminate.  This was followed by an MRI liver with and without contrast which was motion degraded but showed diffuse hepatic steatosis and no suspicious hepatic lesion was found.  AFP normal at 2.8.  GGT mildly elevated at 56.  Liver enzymes normal.  Ferritin was normal at 127 with an iron saturation of 62%.   Interval history-patient is doing well overall.  She denies any complaints at this time  ECOG PS- 0 Pain scale- 0   Review of systems- Review of Systems  Constitutional:  Negative for chills, fever, malaise/fatigue and weight loss.  HENT:  Negative for congestion, ear discharge and nosebleeds.   Eyes:  Negative for blurred vision.  Respiratory:  Negative for cough, hemoptysis, sputum production, shortness of breath and wheezing.   Cardiovascular:  Negative for chest pain, palpitations, orthopnea and claudication.   Gastrointestinal:  Negative for abdominal pain, blood in stool, constipation, diarrhea, heartburn, melena, nausea and vomiting.  Genitourinary:  Negative for dysuria, flank pain, frequency, hematuria and urgency.  Musculoskeletal:  Negative for back pain, joint pain and myalgias.  Skin:  Negative for rash.  Neurological:  Negative for dizziness, tingling, focal weakness, seizures, weakness and headaches.  Endo/Heme/Allergies:  Does not bruise/bleed easily.  Psychiatric/Behavioral:  Negative for depression and suicidal ideas. The patient does not have insomnia.       No Known Allergies   Past Medical History:  Diagnosis Date   Appendicitis 06/2022   Atypical mole 03/08/2014   L distal ant lat thigh, excised 04/26/2014   Hepatic steatosis    History of chicken pox    Hx of dysplastic nevus    R tricep, txted in past per patient   Hx of dysplastic nevus 03/08/2014   R lower back, moderate atypia   Hx of dysplastic nevus 01/08/2023   L medial heel, moderate atypia   Migraine    UTI (urinary tract infection)    Vitamin D deficiency      Past Surgical History:  Procedure Laterality Date   CESAREAN SECTION  11/05/1990   COLONOSCOPY  2012   COLONOSCOPY WITH PROPOFOL N/A 10/30/2020   Procedure: COLONOSCOPY WITH PROPOFOL;  Surgeon: Wyline Mood, MD;  Location: [facility] ENDOSCOPY;  Service: Gastroenterology;  Laterality: N/A;   DEBRIDEMENT TENNIS ELBOW Right 12/2006   Right elbow   LAPAROSCOPIC APPENDECTOMY N/A 08/29/2022   Procedure: APPENDECTOMY LAPAROSCOPIC;  Surgeon: Leafy Ro, MD;  Location: [facility] ORS;  Service: General;  Laterality: N/A;   LIVER BIOPSY N/A 08/29/2022  Procedure: LIVER BIOPSY;  Surgeon: Leafy Ro, MD;  Location: [facility] ORS;  Service: General;  Laterality: N/A;    Social History   Socioeconomic History   Marital status: Married    Spouse name: Timothy   Number of children: 2   Years of education: Not on file   Highest education level: Not on file   Occupational History   Not on file  Tobacco Use   Smoking status: Never    Passive exposure: Never   Smokeless tobacco: Never  Vaping Use   Vaping Use: Never used  Substance and Sexual Activity   Alcohol use: Yes    Comment: seldom   Drug use: No   Sexual activity: Yes  Other Topics Concern   Not on file  Social History Narrative   Not on file   Social Determinants of Health   Financial Resource Strain: Not on file  Food Insecurity: No Food Insecurity (07/09/2022)   Hunger Vital Sign    Worried About Running Out of Food in the Last Year: Never true    Ran Out of Food in the Last Year: Never true  Transportation Needs: No Transportation Needs (07/09/2022)   PRAPARE - Administrator, Civil Service (Medical): No    Lack of Transportation (Non-Medical): No  Physical Activity: Not on file  Stress: Not on file  Social Connections: Not on file  Intimate Partner Violence: Not At Risk (07/09/2022)   Humiliation, Afraid, Rape, and Kick questionnaire    Fear of Current or Ex-Partner: No    Emotionally Abused: No    Physically Abused: No    Sexually Abused: No    Family History  Problem Relation Age of Onset   Hyperlipidemia Mother    Hypertension Mother 80       Runs in mother's side of family   Cancer Father        bone   Heart attack Maternal Uncle 46   Heart attack Maternal Grandmother 76   Pulmonary fibrosis Maternal Grandmother    Heart attack Maternal Grandfather 68   Alzheimer's disease Paternal Grandmother    Pulmonary fibrosis Brother 22       idiopathic    Pulmonary fibrosis Paternal Uncle    Breast cancer Maternal Aunt        postmeno.     Current Outpatient Medications:    Cholecalciferol (VITAMIN D3) 1000 UNITS CHEW, Chew 2 tablets by mouth daily. , Disp: , Rfl:    citalopram (CELEXA) 10 MG tablet, TAKE 1 TABLET BY MOUTH EVERY DAY, Disp: 90 tablet, Rfl: 3   rosuvastatin (CRESTOR) 10 MG tablet, Take 1 tablet (10 mg total) by mouth daily.,  Disp: 90 tablet, Rfl: 0  Physical exam:  Vitals:   01/15/23 1019  BP: 119/73  Pulse: 66  Resp: 18  Temp: (!) 97.4 F (36.3 C)  TempSrc: Tympanic  Weight: 153 lb 1.6 oz (69.4 kg)  Height: 5\' 5"  (1.651 m)   Physical Exam Cardiovascular:     Rate and Rhythm: Normal rate and regular rhythm.     Heart sounds: Normal heart sounds.  Pulmonary:     Effort: Pulmonary effort is normal.  Skin:    General: Skin is warm and dry.  Neurological:     Mental Status: She is alert and oriented to person, place, and time.         Latest Ref Rng & Units 01/15/2023   10:00 AM  CMP  Glucose 70 - 99 mg/dL  100   BUN 8 - 23 mg/dL 15   Creatinine 1.61 - 1.00 mg/dL 0.96   Sodium 045 - 409 mmol/L 137   Potassium 3.5 - 5.1 mmol/L 4.0   Chloride 98 - 111 mmol/L 105   CO2 22 - 32 mmol/L 25   Calcium 8.9 - 10.3 mg/dL 9.2   Total Protein 6.5 - 8.1 g/dL 6.7   Total Bilirubin 0.3 - 1.2 mg/dL 0.3   Alkaline Phos 38 - 126 U/L 89   AST 15 - 41 U/L 21   ALT 0 - 44 U/L 19       Latest Ref Rng & Units 01/15/2023   10:00 AM  CBC  WBC 4.0 - 10.5 K/uL 7.4   Hemoglobin 12.0 - 15.0 g/dL 81.1   Hematocrit 91.4 - 46.0 % 38.4   Platelets 150 - 400 K/uL 278     No images are attached to the encounter.  US Abdomen Limited RUQ (LIVER/GB)  Result Date: 01/07/2023 CLINICAL DATA:  Hemochromatosis. EXAM: ULTRASOUND ABDOMEN LIMITED RIGHT UPPER QUADRANT COMPARISON:  MRI abdomen 08/07/2022 FINDINGS: Gallbladder: No gallstones or wall thickening visualized. No sonographic Murphy sign noted by sonographer. Common bile duct: Diameter: 3.7 mm Liver: There is a 9 mm cyst within the right hepatic lobe. Additionally, the previously described echogenic lesion is similar measuring 1.5 x 0.7 x 1.7 cm, previously 2.2 x 1.9 x 0.8 cm. Increased hepatic parenchymal echogenicity. Portal vein is patent on color Doppler imaging with normal direction of blood flow towards the liver. Other: None. IMPRESSION: 1. No cholelithiasis or  sonographic evidence for acute cholecystitis. 2. Previously described echogenic lesion within the right hepatic lobe is similar to slightly decreased in size. This likely represents a small hemangioma. Recommend six-month follow-up right upper quadrant ultrasound to ensure stability. 3. Increased hepatic parenchymal echogenicity suggestive of steatosis. Electronically Signed   By: Annia Belt M.D.   On: 01/07/2023 10:55     Assessment and plan- Patient is a 62 y.o. female with heterozygosity for C2 10 Y here for routine follow-up  Again discussed with the patient that heterozygosity for C282Y does not lead to iron overload.  Although her iron saturation is at 62% her ferritin levels are less than 500.  LFTs are normal.  She does not require any phlebotomy at this time.  I will monitor her labs in 6 months in 1 year and see her back in 1 year.  Patient had an ultrasound abdomen as well to follow-up on the 1.5 cm echogenic lesion in the right hepatic lobe which presently appears smaller in size as compared to before when it was 2.2 cm.  It likely represents a small hemangioma and we will follow this up with a repeat ultrasound in 6 months   Visit Diagnosis 1. 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 Fatty infiltration of liver      Dr. Owens Shark, MD, MPH [facility] [phone] 01/15/2023 1:01 PM

## 2023-01-20 ENCOUNTER — Telehealth: Payer: Self-pay

## 2023-01-20 NOTE — Telephone Encounter (Signed)
-----   Message from David C Kowalski, MD sent at 01/14/2023  8:30 PM EDT ----- Diagnosis Skin , left medial heel DYSPLASTIC JUNCTIONAL LENTIGINOUS NEVUS WITH MODERATE ATYPIA, LIMITED MARGINS FREE  Moderate dysplastic Recheck next visit 

## 2023-01-20 NOTE — Telephone Encounter (Signed)
Patient advise of pathology results.

## 2023-02-14 ENCOUNTER — Encounter: Payer: Self-pay | Admitting: Dermatology

## 2023-03-02 ENCOUNTER — Other Ambulatory Visit: Payer: Self-pay | Admitting: Internal Medicine

## 2023-03-06 ENCOUNTER — Ambulatory Visit: Payer: BC Managed Care – PPO | Attending: Orthopedic Surgery

## 2023-03-06 DIAGNOSIS — M25552 Pain in left hip: Secondary | ICD-10-CM | POA: Diagnosis present

## 2023-03-06 DIAGNOSIS — M79652 Pain in left thigh: Secondary | ICD-10-CM | POA: Diagnosis present

## 2023-03-06 DIAGNOSIS — M5432 Sciatica, left side: Secondary | ICD-10-CM | POA: Insufficient documentation

## 2023-03-06 NOTE — Therapy (Signed)
[facility] [address] [place], [address] Phone: [phone]   Fax:  [phone]  Physical Therapy Evaluation  Patient Details  Name: Kristine Mcguire MRN: [id number] Date of Birth: [birth year] Referring Provider (PT): Teryl Lucy, MD   Encounter Date: 03/06/2023   PT End of Session - 03/06/23 1521     Visit Number 1    Number of Visits 17    Date for PT Re-Evaluation 05/02/23    Authorization - Visit Number 1    Authorization - Number of Visits 20    PT Start Time 1522    PT Stop Time 1621    PT Time Calculation (min) 59 min    Activity Tolerance Patient tolerated treatment well    Behavior During Therapy [facility] for tasks assessed/performed             Past Medical History:  Diagnosis Date   Appendicitis 06/2022   Atypical mole 03/08/2014   L distal ant lat thigh, excised 04/26/2014   Hepatic steatosis    History of chicken pox    Hx of dysplastic nevus    R tricep, txted in past per patient   Hx of dysplastic nevus 03/08/2014   R lower back, moderate atypia   Hx of dysplastic nevus 01/08/2023   L medial heel, moderate atypia   Migraine    UTI (urinary tract infection)    Vitamin D deficiency     Past Surgical History:  Procedure Laterality Date   CESAREAN SECTION  11/05/1990   COLONOSCOPY  2012   COLONOSCOPY WITH PROPOFOL N/A 10/30/2020   Procedure: COLONOSCOPY WITH PROPOFOL;  Surgeon: Wyline Mood, MD;  Location: [facility] ENDOSCOPY;  Service: Gastroenterology;  Laterality: N/A;   DEBRIDEMENT TENNIS ELBOW Right 12/2006   Right elbow   LAPAROSCOPIC APPENDECTOMY N/A 08/29/2022   Procedure: APPENDECTOMY LAPAROSCOPIC;  Surgeon: Leafy Ro, MD;  Location: [facility] ORS;  Service: General;  Laterality: N/A;   LIVER BIOPSY N/A 08/29/2022   Procedure: LIVER BIOPSY;  Surgeon: Leafy Ro, MD;  Location: [facility] ORS;  Service: General;  Laterality: N/A;    There were no vitals filed for this  visit.    Subjective Assessment - 03/06/23 1526     Subjective L posterior hip: 0/10 currently (Pt taking Meloxicam which helps with pain), "I don't know... about a 5/10" at worst for the past 3 months.    Pertinent History LBP and L hamstring pain. Low back has always been tight but its just the way it is. Her main complaint is her L posterior hip pain which began several months ago. Prolonged sitting bothers her L hip. Sleeping on her L side, L lateral hip (greater trochanter) and L anterior thigh (femoral nerve) would be painful. Started taking her Meloxicam 5 days ago (Saturday) which made her L posterior hip feel better.    Patient Stated Goals No pain.    Currently in Pain? No/denies    Pain Score 0-No pain    Pain Location Hip    Pain Orientation Left;Posterior    Pain Descriptors / Indicators Aching   annoying   Pain Type Acute pain    Pain Radiating Towards L ischial tuberosity, posterior thigh    Pain Onset More than a month ago    Pain Frequency Occasional    Aggravating Factors  Prolonged sitting such as for an hour, Sleeping on her L side. deadlift (posterior thigh pain)    Pain Relieving Factors massage,  sitting on a tennis ball                [facility] PT Assessment - 03/06/23 1525       Assessment   Medical Diagnosis L Hamstring, LBP    Referring Provider (PT) Teryl Lucy, MD    Onset Date/Surgical Date 03/03/23   Date PT referral signed.     Precautions   Precaution Comments No known precuations      Restrictions   Other Position/Activity Restrictions No known restrictions      Balance Screen   Has the patient fallen in the past 6 months No    Has the patient had a decrease in activity level because of a fear of falling?  No    Is the patient reluctant to leave their home because of a fear of falling?  No      Prior Function   Vocation Part time employment   retail; involves standing, sitting, walking, some lifting     Posture/Postural Control    Posture Comments R shoulder lower, L lateral shift, L PSIS more anterior, R iliac crest higher, R knee higher      AROM   Lumbar Flexion WFL with L posterior hamstrings pull   No symptoms when performed in sitting with knees bent   Lumbar Extension WFL    Lumbar - Right Side Bend WFL with L proximal posterior hip pull    Lumbar - Left Side Bend WFL    Lumbar - Right Rotation full    Lumbar - Left Rotation full      PROM   Overall PROM Comments WFL PROM hip ER and IR at 90/90 bilaterally      Strength   Overall Strength Comments L ischial tuberosity symptoms with L medial hamstrings eccentric contraction.    Right Hip Flexion 4/5    Right Hip Extension 3+/5    Right Hip ABduction 4/5    Left Hip Flexion 4/5    Left Hip Extension 4-/5   with L low back pain   Left Hip ABduction 4-/5    Right Knee Flexion 5/5    Right Knee Extension 5/5    Left Knee Flexion 5/5    Left Knee Extension 5/5      Palpation   Palpation comment TTP L posterior hip around the sciatic area (superior to ischial tuberosity); TTP L PSIS. Muscle tension L lumbar paraspinals.      Special Tests   Other special tests (+) Slump L LE with reproduction of L posterior thigh (sciatic symtpoms); L femoral nerve symptoms with thoracic extension;  (+) Slump R LE with sciatic symptoms;  (-) L piriformis test      Ambulation/Gait   Gait Comments B pelvic drop, B femoral IR during stance phase                        Objective measurements completed on examination: See above findings.   No latex allergies  No BP problems per pt.   No osteoporosis per pt.     No TTP B greater trochanter  (+) Slump L LE with reproduction of L posterior thigh (sciatic symtpoms); L femoral nerve symptoms with thoracic extension; (+) Slump R LE with sciatic symptoms; (-) L piriformis test  L ischial tuberosity symptoms with L medial hamstrings eccentric contraction.    TTP L posterior hip around the sciatic area  (superior to ischial tuberosity); TTP L PSIS. Muscle tension L lumbar  paraspinals.     Therapeutic exercise Prone on elbows 2 minutes   Standing L trunk side bend 10x5 second holds for 3 sets  Slight decrease in L posterior hip and thigh pulling with standing lumbar flexion   Standing lumbar extension 10x5 seconds    Improved exercise technique, movement at target joints, use of target muscles after mod verbal, visual, tactile cues.     Response to treatment Slight decrease in L posterior hip and thigh pulling after session.     Clinical impression Pt is a 62 year old female who came to physical therapy secondary to low back and L posterior hip and thigh pain. She also presents with altered gait pattern and posture, B hip weakness, reproduction of symptoms with standing lumbar flexion and R lumbar side bending, positive special test suggesting sciatic nerve neural tension, TTP L posterior hip along the piriformis and sciatic nerve area, L PSIS, L lumbar paraspinal muscle tension, and difficulty performing tasks which involve prolonged sitting, bending over, as well as tolerating positions such as L sidelying secondary to pain. Pt will benefit from skilled physical therapy services to address the aforementioned deficits.         Home exercise program Access Code: [email or web address] URL: [email or web address] Date: 03/06/2023 Prepared by: Loralyn Freshwater  Exercises - Prone on Elbows Stretch  - 2 x daily - 7 x weekly - 1 sets - 2 reps - 2 minutes hold - Standing Sidebends  - 1 x daily - 7 x weekly - 3 sets - 10 reps - 5 seconds hold                     PT Short Term Goals - 03/06/23 1636       PT SHORT TERM GOAL #1   Title Pt will be independent with her initial HEP to decrease pain, improve strength and ability to tolerated sitting and L side lying more comfortably.    Baseline Pt has started her initial HEP (03/06/2023)    Time 3    Period Weeks     Status New    Target Date 03/28/23               PT Long Term Goals - 03/06/23 1638       PT LONG TERM GOAL #1   Title Pt will have a decrease in L posterior hip paoin to 1/10 or less at worst to promote ability to tolerate sitting and L side lying more comfortably.    Baseline 5/10 L posterior hip pain at worst for the past 3 months (03/06/2023)    Time 8    Period Weeks    Status New    Target Date 05/02/23      PT LONG TERM GOAL #2   Title Pt will improve L hip extension and abduction strength by at least 1/2 MMT grade to promote ability to perform tasks which involve bending over more comfortably.    Baseline L hip extension 4-/5 (with L low back pain), hip abduction 4-/5 (03/06/2023)    Time 8    Period Weeks    Status New    Target Date 05/02/23      PT LONG TERM GOAL #3   Title Pt will improve her FOTO score by at least 10 points as a demonstration of improved function.    Baseline L upper leg FOTO 77 (03/06/2023)    Time 8    Period Weeks    Status New  Target Date 05/02/23                    Plan - 03/06/23 1630     Clinical Impression Statement Pt is a 62 year old female who came to physical therapy secondary to low back and L posterior hip and thigh pain. She also presents with altered gait pattern and posture, B hip weakness, reproduction of symptoms with standing lumbar flexion and R lumbar side bending, positive special test suggesting sciatic nerve neural tension, TTP L posterior hip along the piriformis and sciatic nerve area, L PSIS, L lumbar paraspinal muscle tension, and difficulty performing tasks which involve prolonged sitting, bending over, as well as tolerating positions such as L sidelying secondary to pain. Pt will benefit from skilled physical therapy services to address the aforementioned deficits.    Personal Factors and Comorbidities Comorbidity 2;Past/Current Experience;Fitness    Comorbidities Appendicitis, hx of C-section     Examination-Activity Limitations Sit;Sleep;Bend    Stability/Clinical Decision Making Stable/Uncomplicated    Clinical Decision Making Low    Rehab Potential Fair    PT Frequency 2x / week    PT Duration 8 weeks    PT Treatment/Interventions Therapeutic activities;Therapeutic exercise;Neuromuscular re-education;Patient/family education;Manual techniques;Dry needling;Spinal Manipulations;Joint Manipulations;Aquatic Therapy;Electrical Stimulation;Iontophoresis 4mg /ml Dexamethasone;Traction    PT Next Visit Plan posture, extension and L lumbar side bending, trunk and glute strengthening, L hamstrings loading, manual techniques, modalities PRN    PT Home Exercise Plan Medbridge Access Code: [email or web address]    Consulted and Agree with Plan of Care Patient             Patient will benefit from skilled therapeutic intervention in order to improve the following deficits and impairments:  Pain, Postural dysfunction, Improper body mechanics, Decreased strength  Visit Diagnosis: Sciatica, left side - Plan: PT plan of care cert/re-cert  Pain in left hip - Plan: PT plan of care cert/re-cert  Pain in left thigh - Plan: PT plan of care cert/re-cert     Problem List Patient Active Problem List   Diagnosis Date Noted   Tinea corporis 12/05/2022   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, [place], [address] Phone: [phone]   Fax:  [phone]  Name: KALIANA ALBINO MRN: [id number] Date of Birth:  1960/09/20

## 2023-03-10 ENCOUNTER — Ambulatory Visit: Payer: BC Managed Care – PPO

## 2023-03-10 DIAGNOSIS — M5432 Sciatica, left side: Secondary | ICD-10-CM | POA: Diagnosis not present

## 2023-03-10 DIAGNOSIS — M79652 Pain in left thigh: Secondary | ICD-10-CM

## 2023-03-10 DIAGNOSIS — M25552 Pain in left hip: Secondary | ICD-10-CM

## 2023-03-10 NOTE — Therapy (Signed)
[facility] [address], [place], [address] Phone: [phone]   Fax:  [phone]  Physical Therapy Treatment  Patient Details  Name: Kristine Mcguire MRN: [id number] Date of Birth: Jun 02, 1961 Referring Provider (PT): Teryl Lucy, MD   Encounter Date: 03/10/2023   PT End of Session - 03/10/23 0821     Visit Number 2    Number of Visits 17    Date for PT Re-Evaluation 05/02/23    Authorization - Number of Visits 20    PT Start Time 0821    PT Stop Time 0902    PT Time Calculation (min) 41 min    Activity Tolerance Patient tolerated treatment well    Behavior During Therapy [facility] for tasks assessed/performed              Past Medical History:  Diagnosis Date   Appendicitis 06/2022   Atypical mole 03/08/2014   L distal ant lat thigh, excised 04/26/2014   Hepatic steatosis    History of chicken pox    Hx of dysplastic nevus    R tricep, txted in past per patient   Hx of dysplastic nevus 03/08/2014   R lower back, moderate atypia   Hx of dysplastic nevus 01/08/2023   L medial heel, moderate atypia   Migraine    UTI (urinary tract infection)    Vitamin D deficiency     Past Surgical History:  Procedure Laterality Date   CESAREAN SECTION  11/05/1990   COLONOSCOPY  2012   COLONOSCOPY WITH PROPOFOL N/A 10/30/2020   Procedure: COLONOSCOPY WITH PROPOFOL;  Surgeon: Wyline Mood, MD;  Location: [facility] ENDOSCOPY;  Service: Gastroenterology;  Laterality: N/A;   DEBRIDEMENT TENNIS ELBOW Right 12/2006   Right elbow   LAPAROSCOPIC APPENDECTOMY N/A 08/29/2022   Procedure: APPENDECTOMY LAPAROSCOPIC;  Surgeon: Leafy Ro, MD;  Location: [facility] ORS;  Service: General;  Laterality: N/A;   LIVER BIOPSY N/A 08/29/2022   Procedure: LIVER BIOPSY;  Surgeon: Leafy Ro, MD;  Location: [facility] ORS;  Service: General;  Laterality: N/A;    There were no vitals filed for this  visit.                         PCP: Sherlene Shams, MD   REFERRING PROVIDER: Teryl Lucy, MD   REFERRING DIAG: L Hamstring, LBP   THERAPY DIAG:  Pain in left hip  Sciatica, left side  Pain in left thigh  Rationale for Evaluation and Treatment: Rehabilitation  ONSET DATE: 03/03/23   Date PT referral signed.     Rationale for Evaluation and Treatment Rehabilitation  PERTINENT HISTORY: LBP and L hamstring pain. Low back has always been tight but its just the way it is. Her main complaint is her L posterior hip pain which began several months ago. Prolonged sitting bothers her L hip. Sleeping on her L side, L lateral hip (greater trochanter) and L anterior thigh (femoral nerve) would be painful. Started taking her Meloxicam 5 days ago (Saturday) which made her L posterior hip feel better.   PRECAUTIONS:   SUBJECTIVE:   SUBJECTIVE STATEMENT: Back is fine, L posterior hip bothers her, ached and burned when she tried to go to sleep last night. Takes about 45 minutes to subside. Not bothering her right now       PAIN:  Are you having pain? See subjective   TODAY'S TREATMENT:  DATE: 03/10/2023    No latex allergies  No BP problems per pt.   No osteoporosis per pt.     No TTP B greater trochanter  (+) Slump L LE with reproduction of L posterior thigh (sciatic symtpoms); L femoral nerve symptoms with thoracic extension; (+) Slump R LE with sciatic symptoms; (-) L piriformis test  L ischial tuberosity symptoms with L medial hamstrings eccentric contraction.    TTP L posterior hip around the sciatic area (superior to ischial tuberosity); TTP L PSIS. Muscle tension L lumbar paraspinals.     Therapeutic exercise   Standing L trunk side bend 10x5 second holds for 3 sets  Seated L piriformis stretch 30  seconds x 3  Single leg dead lift with  RUE assist   10x3  L posterior hip ache  Standing lumbar extension 10x5 seconds for 3 sets  Decreased L posterior hip ache  Standing B shoulder extension with B scapular retraction 10x5 seconds for 2 sets  Bridge 10x3 with B shoulder extension isometrics, glute max and transversus abdominis muscle activation  With B ankle DF during first set of 10.   Prone press-ups 10x5 seconds for 3 sets  Feels good for back per pt.    Sitting with lumbar towel roll  Recommended for pt to utilize when sitting or driving. Pt verbalized understanding. Handout provided.     Improved exercise technique, movement at target joints, use of target muscles after mod verbal, visual, tactile cues.     Response to treatment Decreased L posterior hip discomfort with extension based treatment     Clinical impression Improved L posterior hip symptoms with treatment to promote lumbar extension. Pt tolerated session well without aggravation of symptoms. Pt will benefit from continued skilled physical therapy services to decrease pain, improve strength and function.           PATIENT EDUCATION: Education details: there-ex, HEP Person educated: Patient Education method: Explanation, Demonstration, Tactile cues, and Verbal cues, handout Education comprehension: verbalized understanding and returned demonstration  HOME EXERCISE PROGRAM: Home exercise program Access Code: [email or web address] URL: [email or web address] Date: 03/06/2023 Prepared by: Loralyn Freshwater  Exercises - Prone on Elbows Stretch  - 2 x daily - 7 x weekly - 1 sets - 2 reps - 2 minutes hold  - Standing Sidebends  - 1 x daily - 7 x weekly - 3 sets - 10 reps - 5 seconds hold  - Seated Piriformis Stretch  - 3 x daily - 7 x weekly - 1 sets - 3 reps - 30 seconds hold  - Standing Lumbar Extension  - 1 x daily - 7 x weekly - 3 sets - 10 reps - 5 seconds hold  - Prone Press Up  - 1 x  daily - 7 x weekly - 3 sets - 10 reps - 5 seconds hold  Sitting with lumbar towel roll               PT Short Term Goals - 03/06/23 1636       PT SHORT TERM GOAL #1   Title Pt will be independent with her initial HEP to decrease pain, improve strength and ability to tolerated sitting and L side lying more comfortably.    Baseline Pt has started her initial HEP (03/06/2023)    Time 3    Period Weeks    Status New    Target Date 03/28/23               PT Long  Term Goals - 03/06/23 1638       PT LONG TERM GOAL #1   Title Pt will have a decrease in L posterior hip paoin to 1/10 or less at worst to promote ability to tolerate sitting and L side lying more comfortably.    Baseline 5/10 L posterior hip pain at worst for the past 3 months (03/06/2023)    Time 8    Period Weeks    Status New    Target Date 05/02/23      PT LONG TERM GOAL #2   Title Pt will improve L hip extension and abduction strength by at least 1/2 MMT grade to promote ability to perform tasks which involve bending over more comfortably.    Baseline L hip extension 4-/5 (with L low back pain), hip abduction 4-/5 (03/06/2023)    Time 8    Period Weeks    Status New    Target Date 05/02/23      PT LONG TERM GOAL #3   Title Pt will improve her FOTO score by at least 10 points as a demonstration of improved function.    Baseline L upper leg FOTO 77 (03/06/2023)    Time 8    Period Weeks    Status New    Target Date 05/02/23                    Plan - 03/10/23 0807     Clinical Impression Statement Improved L posterior hip symptoms with treatment to promote lumbar extension. Pt tolerated session well without aggravation of symptoms. Pt will benefit from continued skilled physical therapy services to decrease pain, improve strength and function.    Personal Factors and Comorbidities Comorbidity 2;Past/Current Experience;Fitness    Comorbidities Appendicitis, hx of C-section     Examination-Activity Limitations Sit;Sleep;Bend    Stability/Clinical Decision Making Stable/Uncomplicated    Rehab Potential Fair    PT Frequency 2x / week    PT Duration 8 weeks    PT Treatment/Interventions Therapeutic activities;Therapeutic exercise;Neuromuscular re-education;Patient/family education;Manual techniques;Dry needling;Spinal Manipulations;Joint Manipulations;Aquatic Therapy;Electrical Stimulation;Iontophoresis 4mg /ml Dexamethasone;Traction    PT Next Visit Plan posture, extension and L lumbar side bending, trunk and glute strengthening, L hamstrings loading, manual techniques, modalities PRN    PT Home Exercise Plan Medbridge Access Code: [email or web address]    Consulted and Agree with Plan of Care Patient             Patient will benefit from skilled therapeutic intervention in order to improve the following deficits and impairments:  Pain, Postural dysfunction, Improper body mechanics, Decreased strength  Visit Diagnosis: Pain in left hip  Sciatica, left side  Pain in left thigh     Problem List Patient Active Problem List   Diagnosis Date Noted   Tinea corporis 12/05/2022   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, [place], [address] Phone: [phone]   Fax:  [phone]  Name: Kristine Mcguire MRN: [id number] Date of Birth: November 19, 1960

## 2023-03-12 ENCOUNTER — Ambulatory Visit: Payer: BC Managed Care – PPO

## 2023-03-12 DIAGNOSIS — M79652 Pain in left thigh: Secondary | ICD-10-CM

## 2023-03-12 DIAGNOSIS — M25552 Pain in left hip: Secondary | ICD-10-CM

## 2023-03-12 DIAGNOSIS — M5432 Sciatica, left side: Secondary | ICD-10-CM | POA: Diagnosis not present

## 2023-03-12 NOTE — Therapy (Signed)
[facility] [address] [place], [address] Phone: [phone]   Fax:  [phone]  Physical Therapy Treatment  Patient Details  Name: Kristine Mcguire MRN: [id number] Date of Birth: 06/27/1961 Referring Provider (PT): Teryl Lucy, MD   Encounter Date: 03/12/2023   PT End of Session - 03/12/23 1035     Visit Number 3    Number of Visits 17    Date for PT Re-Evaluation 05/02/23    Authorization - Number of Visits 20    PT Start Time 1035    PT Stop Time 1113    PT Time Calculation (min) 38 min    Activity Tolerance Patient tolerated treatment well    Behavior During Therapy [facility] for tasks assessed/performed               Past Medical History:  Diagnosis Date   Appendicitis 06/2022   Atypical mole 03/08/2014   L distal ant lat thigh, excised 04/26/2014   Hepatic steatosis    History of chicken pox    Hx of dysplastic nevus    R tricep, txted in past per patient   Hx of dysplastic nevus 03/08/2014   R lower back, moderate atypia   Hx of dysplastic nevus 01/08/2023   L medial heel, moderate atypia   Migraine    UTI (urinary tract infection)    Vitamin D deficiency     Past Surgical History:  Procedure Laterality Date   CESAREAN SECTION  11/05/1990   COLONOSCOPY  2012   COLONOSCOPY WITH PROPOFOL N/A 10/30/2020   Procedure: COLONOSCOPY WITH PROPOFOL;  Surgeon: Wyline Mood, MD;  Location: [facility] ENDOSCOPY;  Service: Gastroenterology;  Laterality: N/A;   DEBRIDEMENT TENNIS ELBOW Right 12/2006   Right elbow   LAPAROSCOPIC APPENDECTOMY N/A 08/29/2022   Procedure: APPENDECTOMY LAPAROSCOPIC;  Surgeon: Leafy Ro, MD;  Location: [facility] ORS;  Service: General;  Laterality: N/A;   LIVER BIOPSY N/A 08/29/2022   Procedure: LIVER BIOPSY;  Surgeon: Leafy Ro, MD;  Location: [facility] ORS;  Service: General;  Laterality: N/A;    There were no vitals filed for this  visit.                         PCP: Sherlene Shams, MD   REFERRING PROVIDER: Teryl Lucy, MD   REFERRING DIAG: L Hamstring, LBP   THERAPY DIAG:  Pain in left hip  Sciatica, left side  Pain in left thigh  Rationale for Evaluation and Treatment: Rehabilitation  ONSET DATE: 03/03/23   Date PT referral signed.     Rationale for Evaluation and Treatment Rehabilitation  PERTINENT HISTORY: LBP and L hamstring pain. Low back has always been tight but its just the way it is. Her main complaint is her L posterior hip pain which began several months ago. Prolonged sitting bothers her L hip. Sleeping on her L side, L lateral hip (greater trochanter) and L anterior thigh (femoral nerve) would be painful. Started taking her Meloxicam 5 days ago (Saturday) which made her L posterior hip feel better.   PRECAUTIONS:   SUBJECTIVE:   SUBJECTIVE STATEMENT: Back and L hip are good. L posterior hip was sore yesterday morning. Did her exercises. Today is much less sore. 0/10 currently. Driving 4 hours to [place] this Saturday.       PAIN:  Are you having pain? See subjective   TODAY'S TREATMENT:  DATE: 03/12/2023    No latex allergies  No BP problems per pt.   No osteoporosis per pt.     No TTP B greater trochanter  (+) Slump L LE with reproduction of L posterior thigh (sciatic symtpoms); L femoral nerve symptoms with thoracic extension; (+) Slump R LE with sciatic symptoms; (-) L piriformis test  L ischial tuberosity symptoms with L medial hamstrings eccentric contraction.    TTP L posterior hip around the sciatic area (superior to ischial tuberosity); TTP L PSIS. Muscle tension L lumbar paraspinals.     Therapeutic exercise  Standing lumbar extension with L bias 10x5 seconds for 3 sets  Prone glute max  extension with 2 pillows under abdomen  L 10x5 seconds for 3 sets   R 10x5 seconds for 3 sets  Prone press-ups 10x5 seconds for 3 sets  Feels good for back per pt.   Bridge 10x3 with 5 second holds with B shoulder extension isometrics, glute max and transversus abdominis muscle activation  With B ankle DF during first set of 10.    Standing B shoulder extension with B scapular retraction 10x5 seconds   Red band      Improved exercise technique, movement at target joints, use of target muscles after mod verbal, visual, tactile cues.     Response to treatment Pt tolerated session well without aggravation of symptoms.      Clinical impression  Continued with extension based exercises secondary to positive results from previous session. Good glute muscle use felt with exercises as well.  Pt tolerated session well without aggravation of symptoms. Pt will benefit from continued skilled physical therapy services to decrease pain, improve strength and function.           PATIENT EDUCATION: Education details: there-ex, HEP Person educated: Patient Education method: Explanation, Demonstration, Tactile cues, and Verbal cues, handout Education comprehension: verbalized understanding and returned demonstration  HOME EXERCISE PROGRAM: Home exercise program Access Code: [email or web address] URL: [email or web address] Date: 03/06/2023 Prepared by: Loralyn Freshwater  Exercises - Prone on Elbows Stretch  - 2 x daily - 7 x weekly - 1 sets - 2 reps - 2 minutes hold  - Standing Sidebends  - 1 x daily - 7 x weekly - 3 sets - 10 reps - 5 seconds hold  - Seated Piriformis Stretch  - 3 x daily - 7 x weekly - 1 sets - 3 reps - 30 seconds hold  - Standing Lumbar Extension  - 1 x daily - 7 x weekly - 3 sets - 10 reps - 5 seconds hold  - Prone Press Up  - 1 x daily - 7 x weekly - 3 sets - 10 reps - 5 seconds hold  Sitting with lumbar towel roll               PT Short  Term Goals - 03/06/23 1636       PT SHORT TERM GOAL #1   Title Pt will be independent with her initial HEP to decrease pain, improve strength and ability to tolerated sitting and L side lying more comfortably.    Baseline Pt has started her initial HEP (03/06/2023)    Time 3    Period Weeks    Status New    Target Date 03/28/23               PT Long Term Goals - 03/06/23 1638       PT LONG TERM GOAL #1   Title Pt  will have a decrease in L posterior hip paoin to 1/10 or less at worst to promote ability to tolerate sitting and L side lying more comfortably.    Baseline 5/10 L posterior hip pain at worst for the past 3 months (03/06/2023)    Time 8    Period Weeks    Status New    Target Date 05/02/23      PT LONG TERM GOAL #2   Title Pt will improve L hip extension and abduction strength by at least 1/2 MMT grade to promote ability to perform tasks which involve bending over more comfortably.    Baseline L hip extension 4-/5 (with L low back pain), hip abduction 4-/5 (03/06/2023)    Time 8    Period Weeks    Status New    Target Date 05/02/23      PT LONG TERM GOAL #3   Title Pt will improve her FOTO score by at least 10 points as a demonstration of improved function.    Baseline L upper leg FOTO 77 (03/06/2023)    Time 8    Period Weeks    Status New    Target Date 05/02/23                    Plan - 03/12/23 1035     Clinical Impression Statement Continued with extension based exercises secondary to positive results from previous session. Good glute muscle use felt with exercises as well.  Pt tolerated session well without aggravation of symptoms. Pt will benefit from continued skilled physical therapy services to decrease pain, improve strength and function.    Personal Factors and Comorbidities Comorbidity 2;Past/Current Experience;Fitness    Comorbidities Appendicitis, hx of C-section    Examination-Activity Limitations Sit;Sleep;Bend    Stability/Clinical  Decision Making Stable/Uncomplicated    Rehab Potential Fair    PT Frequency 2x / week    PT Duration 8 weeks    PT Treatment/Interventions Therapeutic activities;Therapeutic exercise;Neuromuscular re-education;Patient/family education;Manual techniques;Dry needling;Spinal Manipulations;Joint Manipulations;Aquatic Therapy;Electrical Stimulation;Iontophoresis 4mg /ml Dexamethasone;Traction    PT Next Visit Plan posture, extension and L lumbar side bending, trunk and glute strengthening, L hamstrings loading, manual techniques, modalities PRN    PT Home Exercise Plan Medbridge Access Code: [email or web address]    Consulted and Agree with Plan of Care Patient             Patient will benefit from skilled therapeutic intervention in order to improve the following deficits and impairments:  Pain, Postural dysfunction, Improper body mechanics, Decreased strength  Visit Diagnosis: Pain in left hip  Sciatica, left side  Pain in left thigh     Problem List Patient Active Problem List   Diagnosis Date Noted   Tinea corporis 12/05/2022   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, [place], [address] Phone: [phone]   Fax:  [phone]  Name: Kristine Mcguire MRN: [id number] Date of Birth: 1960/09/03

## 2023-03-21 ENCOUNTER — Ambulatory Visit: Payer: BC Managed Care – PPO | Attending: Orthopedic Surgery

## 2023-03-21 DIAGNOSIS — M5432 Sciatica, left side: Secondary | ICD-10-CM | POA: Diagnosis present

## 2023-03-21 DIAGNOSIS — M79652 Pain in left thigh: Secondary | ICD-10-CM | POA: Insufficient documentation

## 2023-03-21 DIAGNOSIS — M25552 Pain in left hip: Secondary | ICD-10-CM | POA: Diagnosis present

## 2023-03-21 NOTE — Therapy (Signed)
[facility] [address], [place], [address] Phone: [phone]   Fax:  [phone]  Physical Therapy Treatment  Patient Details  Name: Kristine Mcguire MRN: [id number] Date of Birth: [birth year] Referring Provider (PT): Teryl Lucy, MD   Encounter Date: 03/21/2023   PT End of Session - 03/21/23 0732     Visit Number 4    Number of Visits 17    Date for PT Re-Evaluation 05/02/23    Authorization - Number of Visits 20    PT Start Time 0732    PT Stop Time 0814    PT Time Calculation (min) 42 min    Activity Tolerance Patient tolerated treatment well    Behavior During Therapy [facility] for tasks assessed/performed                Past Medical History:  Diagnosis Date   Appendicitis 06/2022   Atypical mole 03/08/2014   L distal ant lat thigh, excised 04/26/2014   Hepatic steatosis    History of chicken pox    Hx of dysplastic nevus    R tricep, txted in past per patient   Hx of dysplastic nevus 03/08/2014   R lower back, moderate atypia   Hx of dysplastic nevus 01/08/2023   L medial heel, moderate atypia   Migraine    UTI (urinary tract infection)    Vitamin D deficiency     Past Surgical History:  Procedure Laterality Date   CESAREAN SECTION  11/05/1990   COLONOSCOPY  2012   COLONOSCOPY WITH PROPOFOL N/A 10/30/2020   Procedure: COLONOSCOPY WITH PROPOFOL;  Surgeon: Wyline Mood, MD;  Location: [facility] ENDOSCOPY;  Service: Gastroenterology;  Laterality: N/A;   DEBRIDEMENT TENNIS ELBOW Right 12/2006   Right elbow   LAPAROSCOPIC APPENDECTOMY N/A 08/29/2022   Procedure: APPENDECTOMY LAPAROSCOPIC;  Surgeon: Leafy Ro, MD;  Location: [facility] ORS;  Service: General;  Laterality: N/A;   LIVER BIOPSY N/A 08/29/2022   Procedure: LIVER BIOPSY;  Surgeon: Leafy Ro, MD;  Location: [facility] ORS;  Service: General;  Laterality: N/A;    There were no vitals filed for this  visit.                         PCP: Sherlene Shams, MD   REFERRING PROVIDER: Teryl Lucy, MD   REFERRING DIAG: L Hamstring, LBP   THERAPY DIAG:  Pain in left hip  Sciatica, left side  Pain in left thigh  Rationale for Evaluation and Treatment: Rehabilitation  ONSET DATE: 03/03/23   Date PT referral signed.     Rationale for Evaluation and Treatment Rehabilitation  PERTINENT HISTORY: LBP and L hamstring pain. Low back has always been tight but its just the way it is. Her main complaint is her L posterior hip pain which began several months ago. Prolonged sitting bothers her L hip. Sleeping on her L side, L lateral hip (greater trochanter) and L anterior thigh (femoral nerve) would be painful. Started taking her Meloxicam 5 days ago (Saturday) which made her L posterior hip feel better.   PRECAUTIONS:   SUBJECTIVE:   SUBJECTIVE STATEMENT: Back and L hip were pretty good during the drive to [place]. Started to feel R posterior hip pain at the last 20 minutes during drive. L posterior hip did not bother her. Pt can lie down on L side without symptoms. L hamstring feels a lot better. The HEP are fine. No pain  currently. No symptoms in L or R hip currently. Stopped the Meloxicam last Saturday.       PAIN:  Are you having pain? See subjective   TODAY'S TREATMENT:                                                                                                                                         DATE: 03/21/2023    No latex allergies  No BP problems per pt.   No osteoporosis per pt.     No TTP B greater trochanter  (+) Slump L LE with reproduction of L posterior thigh (sciatic symtpoms); L femoral nerve symptoms with thoracic extension; (+) Slump R LE with sciatic symptoms; (-) L piriformis test  L ischial tuberosity symptoms with L medial hamstrings eccentric contraction.    TTP L posterior hip around the sciatic area (superior to  ischial tuberosity); TTP L PSIS. Muscle tension L lumbar paraspinals.     Therapeutic exercise  Sitting posture: L trunk lateral shift  Seated manually resisted R trunk side bend isometrics in neutral to promote more neutral posture 10x3 with 5 second holds  Standing B shoulder extension with scapular retraction, red band 10x5 seconds for 2 sets  Standing dead lifts   R 10x, then 10x5 seconds    R posterior hip symptoms.   Standing lumbar extension 10x5 seconds   Prone glute max extension, knee bent  R 10x5 seconds for 3 sets  L 10x5 seconds for 3 sets  Seated thoracic extension at chair 10x5 seconds for 3 sets  Standing B scapular retraction red band 10x5 seconds for 2 sets  Standing chin tucks 10x5 seconds to promote thoracic extension      Improved exercise technique, movement at target joints, use of target muscles after mod verbal, visual, tactile cues.     Response to treatment Pt tolerated session well without aggravation of symptoms.      Clinical impression  Improved sitting tolerance for driving longer distances based on subjective reports. Continued with extension based exercises secondary to directional preference. Also worked on glute max strengthening and thoracic extension to help decrease stress to affected areas.  Pt tolerated session well without aggravation of symptoms. Pt will benefit from continued skilled physical therapy services to decrease pain, improve strength and function.           PATIENT EDUCATION: Education details: there-ex, HEP Person educated: Patient Education method: Explanation, Demonstration, Tactile cues, and Verbal cues, handout Education comprehension: verbalized understanding and returned demonstration  HOME EXERCISE PROGRAM: Home exercise program Access Code: [email or web address] URL: [email or web address] Date: 03/06/2023 Prepared by: Loralyn Freshwater  Exercises - Prone on Elbows Stretch  - 2 x daily - 7 x  weekly - 1 sets - 2 reps - 2 minutes hold  - Standing Sidebends  - 1 x daily - 7 x weekly - 3 sets - 10 reps - 5  seconds hold  - Seated Piriformis Stretch  - 3 x daily - 7 x weekly - 1 sets - 3 reps - 30 seconds hold  - Standing Lumbar Extension  - 1 x daily - 7 x weekly - 3 sets - 10 reps - 5 seconds hold  - Prone Press Up  - 1 x daily - 7 x weekly - 3 sets - 10 reps - 5 seconds hold  Sitting with lumbar towel roll  - Prone Hip Extension with Bent Knee  - 1 x daily - 7 x weekly - 3 sets - 10 reps - 5 seconds hold   - Seated Thoracic Lumbar Extension  - 1 x daily - 7 x weekly - 3 sets - 10 reps - 5 seconds hold  - Seated Cervical Retraction  - 1 x daily - 7 x weekly - 3 sets - 10 reps - 5 seconds hold        PT Short Term Goals - 03/21/23 0735       PT SHORT TERM GOAL #1   Title Pt will be independent with her initial HEP to decrease pain, improve strength and ability to tolerated sitting and L side lying more comfortably.    Baseline Pt has started her initial HEP (03/06/2023); doing her HEP, no questions (03/21/2023)    Time 3    Period Weeks    Status Achieved    Target Date 03/28/23               PT Long Term Goals - 03/21/23 0735       PT LONG TERM GOAL #1   Title Pt will have a decrease in L posterior hip pain to 1/10 or less at worst to promote ability to tolerate sitting and L side lying more comfortably.    Baseline 5/10 L posterior hip pain at worst for the past 3 months (03/06/2023); 1/10 L posterior hip pain at most for the past 7 days (03/21/2023)    Time 8    Period Weeks    Status Achieved    Target Date 05/02/23      PT LONG TERM GOAL #2   Title Pt will improve L hip extension and abduction strength by at least 1/2 MMT grade to promote ability to perform tasks which involve bending over more comfortably.    Baseline L hip extension 4-/5 (with L low back pain), hip abduction 4-/5 (03/06/2023)    Time 8    Period Weeks    Status On-going    Target  Date 05/02/23      PT LONG TERM GOAL #3   Title Pt will improve her FOTO score by at least 10 points as a demonstration of improved function.    Baseline L upper leg FOTO 77 (03/06/2023), (03/21/2023)    Time 8    Period Weeks    Status On-going    Target Date 05/02/23                    Plan - 03/21/23 0728     Clinical Impression Statement Improved sitting tolerance for driving longer distances based on subjective reports. Continued with extension based exercises secondary to directional preference. Also worked on glute max strengthening and thoracic extension to help decrease stress to affected areas.  Pt tolerated session well without aggravation of symptoms. Pt will benefit from continued skilled physical therapy services to decrease pain, improve strength and function.    Personal Factors and Comorbidities  Comorbidity 2;Past/Current Experience;Fitness    Comorbidities Appendicitis, hx of C-section    Examination-Activity Limitations Sit;Sleep;Bend    Stability/Clinical Decision Making Stable/Uncomplicated    Rehab Potential Fair    PT Frequency 2x / week    PT Duration 8 weeks    PT Treatment/Interventions Therapeutic activities;Therapeutic exercise;Neuromuscular re-education;Patient/family education;Manual techniques;Dry needling;Spinal Manipulations;Joint Manipulations;Aquatic Therapy;Electrical Stimulation;Iontophoresis 4mg /ml Dexamethasone;Traction    PT Next Visit Plan posture, extension and L lumbar side bending, trunk and glute strengthening, L hamstrings loading, manual techniques, modalities PRN    PT Home Exercise Plan Medbridge Access Code: [email or web address]    Consulted and Agree with Plan of Care Patient             Patient will benefit from skilled therapeutic intervention in order to improve the following deficits and impairments:  Pain, Postural dysfunction, Improper body mechanics, Decreased strength  Visit Diagnosis: Pain in left hip  Sciatica, left  side  Pain in left thigh     Problem List Patient Active Problem List   Diagnosis Date Noted   Tinea corporis 12/05/2022   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 [place], [address] Phone: [phone]   Fax:  [phone]  Name: Kristine Mcguire MRN: [id number] Date of Birth: [birth year]

## 2023-03-26 ENCOUNTER — Ambulatory Visit: Payer: BC Managed Care – PPO

## 2023-03-26 DIAGNOSIS — M25552 Pain in left hip: Secondary | ICD-10-CM

## 2023-03-26 DIAGNOSIS — M5432 Sciatica, left side: Secondary | ICD-10-CM

## 2023-03-26 DIAGNOSIS — M79652 Pain in left thigh: Secondary | ICD-10-CM

## 2023-03-26 NOTE — Therapy (Signed)
[facility] [address] [place], [address] Phone: [phone]   Fax:  [phone]  Physical Therapy Treatment  Patient Details  Name: Kristine Mcguire MRN: [id number] Date of Birth: 1960/10/30 Referring Provider (PT): Teryl Lucy, MD   Encounter Date: 03/26/2023   PT End of Session - 03/26/23 1513     Visit Number 5    Number of Visits 17    Date for PT Re-Evaluation 05/02/23    Authorization - Number of Visits 20    PT Start Time 1514    PT Stop Time 1555    PT Time Calculation (min) 41 min    Activity Tolerance Patient tolerated treatment well    Behavior During Therapy [facility] for tasks assessed/performed                Past Medical History:  Diagnosis Date   Appendicitis 06/2022   Atypical mole 03/08/2014   L distal ant lat thigh, excised 04/26/2014   Hepatic steatosis    History of chicken pox    Hx of dysplastic nevus    R tricep, txted in past per patient   Hx of dysplastic nevus 03/08/2014   R lower back, moderate atypia   Hx of dysplastic nevus 01/08/2023   L medial heel, moderate atypia   Migraine    UTI (urinary tract infection)    Vitamin D deficiency     Past Surgical History:  Procedure Laterality Date   CESAREAN SECTION  11/05/1990   COLONOSCOPY  2012   COLONOSCOPY WITH PROPOFOL N/A 10/30/2020   Procedure: COLONOSCOPY WITH PROPOFOL;  Surgeon: Wyline Mood, MD;  Location: [facility] ENDOSCOPY;  Service: Gastroenterology;  Laterality: N/A;   DEBRIDEMENT TENNIS ELBOW Right 12/2006   Right elbow   LAPAROSCOPIC APPENDECTOMY N/A 08/29/2022   Procedure: APPENDECTOMY LAPAROSCOPIC;  Surgeon: Leafy Ro, MD;  Location: [facility] ORS;  Service: General;  Laterality: N/A;   LIVER BIOPSY N/A 08/29/2022   Procedure: LIVER BIOPSY;  Surgeon: Leafy Ro, MD;  Location: [facility] ORS;  Service: General;  Laterality: N/A;    There were no vitals filed for this visit.    PCP: Sherlene Shams,  MD   REFERRING PROVIDER: Teryl Lucy, MD   REFERRING DIAG: L Hamstring, LBP   THERAPY DIAG:  Pain in left hip  Sciatica, left side  Pain in left thigh  Rationale for Evaluation and Treatment: Rehabilitation  ONSET DATE: 03/03/23   Date PT referral signed.     Rationale for Evaluation and Treatment Rehabilitation  PERTINENT HISTORY: LBP and L hamstring pain. Low back has always been tight but its just the way it is. Her main complaint is her L posterior hip pain which began several months ago. Prolonged sitting bothers her L hip. Sleeping on her L side, L lateral hip (greater trochanter) and L anterior thigh (femoral nerve) would be painful. Started taking her Meloxicam 5 days ago (Saturday) which made her L posterior hip feel better.   PRECAUTIONS:   SUBJECTIVE:   SUBJECTIVE STATEMENT:   Continuing to express relief in her L hip. Still not fully resolved. Continues to be bothersome with prolonged drives. Small, dull ache today isolated to L greater trochanter region.     PAIN:  Are you having pain? See subjective   TODAY'S TREATMENT:  DATE: 03/26/2023  No latex allergies  No BP problems per pt.   No osteoporosis per pt.     No TTP B greater trochanter  (+) Slump L LE with reproduction of L posterior thigh (sciatic symtpoms); L femoral nerve symptoms with thoracic extension; (+) Slump R LE with sciatic symptoms; (-) L piriformis test  L ischial tuberosity symptoms with L medial hamstrings eccentric contraction.    TTP L posterior hip around the sciatic area (superior to ischial tuberosity); TTP L PSIS. Muscle tension L lumbar paraspinals.     Therapeutic exercise  Bridges with RTB at distal femurs: 3x12  Hook lying figure four stretch on LLE with RLE OP and hip flexion: 2x30 sec   Prone glute max extension, knee  bent  R 10x5 seconds for 3 sets  L 10x5 seconds for 3 sets  Seated clam shells: GTB, 2x12, 5 sec holds   Seated reverse clam shell with green ball b/t knees: 3x12/LE with RTB  Standing dead lifts   R/L x10/LE with SUE support      Improved exercise technique, movement at target joints, use of target muscles after mod verbal, visual, tactile cues.     Response to treatment Pt tolerated session well without aggravation of symptoms.      Clinical impression Continuing PT POC with primary focus on hip strength progression. Pt tolerating new resisted exercises without onset of worsening pain. Overall excellent form/technique with her exercises today. Educated on DOMS and to report to primary PT for tolerance for additional therex performed today. Pt understanding. Pt will benefit from continued skilled physical therapy services to decrease pain, improve strength and function.     PATIENT EDUCATION: Education details: there-ex, HEP Person educated: Patient Education method: Explanation, Demonstration, Tactile cues, and Verbal cues, handout Education comprehension: verbalized understanding and returned demonstration  HOME EXERCISE PROGRAM: Home exercise program Access Code: [email or web address] URL: [email or web address] Date: 03/06/2023 Prepared by: Loralyn Freshwater  Exercises - Prone on Elbows Stretch  - 2 x daily - 7 x weekly - 1 sets - 2 reps - 2 minutes hold  - Standing Sidebends  - 1 x daily - 7 x weekly - 3 sets - 10 reps - 5 seconds hold  - Seated Piriformis Stretch  - 3 x daily - 7 x weekly - 1 sets - 3 reps - 30 seconds hold  - Standing Lumbar Extension  - 1 x daily - 7 x weekly - 3 sets - 10 reps - 5 seconds hold  - Prone Press Up  - 1 x daily - 7 x weekly - 3 sets - 10 reps - 5 seconds hold  Sitting with lumbar towel roll  - Prone Hip Extension with Bent Knee  - 1 x daily - 7 x weekly - 3 sets - 10 reps - 5 seconds hold   - Seated Thoracic Lumbar  Extension  - 1 x daily - 7 x weekly - 3 sets - 10 reps - 5 seconds hold  - Seated Cervical Retraction  - 1 x daily - 7 x weekly - 3 sets - 10 reps - 5 seconds hold        PT Short Term Goals - 03/21/23 0735       PT SHORT TERM GOAL #1   Title Pt will be independent with her initial HEP to decrease pain, improve strength and ability to tolerated sitting and L side lying more comfortably.    Baseline Pt has started her initial HEP (03/06/2023);  doing her HEP, no questions (03/21/2023)    Time 3    Period Weeks    Status Achieved    Target Date 03/28/23               PT Long Term Goals - 03/21/23 0735       PT LONG TERM GOAL #1   Title Pt will have a decrease in L posterior hip pain to 1/10 or less at worst to promote ability to tolerate sitting and L side lying more comfortably.    Baseline 5/10 L posterior hip pain at worst for the past 3 months (03/06/2023); 1/10 L posterior hip pain at most for the past 7 days (03/21/2023)    Time 8    Period Weeks    Status Achieved    Target Date 05/02/23      PT LONG TERM GOAL #2   Title Pt will improve L hip extension and abduction strength by at least 1/2 MMT grade to promote ability to perform tasks which involve bending over more comfortably.    Baseline L hip extension 4-/5 (with L low back pain), hip abduction 4-/5 (03/06/2023)    Time 8    Period Weeks    Status On-going    Target Date 05/02/23      PT LONG TERM GOAL #3   Title Pt will improve her FOTO score by at least 10 points as a demonstration of improved function.    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 Fairly IV, PT, DPT Physical Therapist- [place]  [facility]  03/26/2023, 4:05 PM

## 2023-03-31 ENCOUNTER — Ambulatory Visit: Payer: BC Managed Care – PPO

## 2023-03-31 DIAGNOSIS — M25552 Pain in left hip: Secondary | ICD-10-CM | POA: Diagnosis not present

## 2023-03-31 DIAGNOSIS — M79652 Pain in left thigh: Secondary | ICD-10-CM

## 2023-03-31 DIAGNOSIS — M5432 Sciatica, left side: Secondary | ICD-10-CM

## 2023-03-31 NOTE — Therapy (Signed)
[facility] [address], [place], [address] Phone: [phone]   Fax:  [phone]  Physical Therapy Treatment  Patient Details  Name: Kristine Mcguire MRN: [id number] Date of Birth: 10-24-1960 Referring Provider (PT): Teryl Lucy, MD   Encounter Date: 03/31/2023   PT End of Session - 03/31/23 1035     Visit Number 6    Number of Visits 17    Date for PT Re-Evaluation 05/02/23    Authorization - Number of Visits 20    PT Start Time 1035    PT Stop Time 1114    PT Time Calculation (min) 39 min    Activity Tolerance Patient tolerated treatment well    Behavior During Therapy [facility] for tasks assessed/performed                 Past Medical History:  Diagnosis Date   Appendicitis 06/2022   Atypical mole 03/08/2014   L distal ant lat thigh, excised 04/26/2014   Hepatic steatosis    History of chicken pox    Hx of dysplastic nevus    R tricep, txted in past per patient   Hx of dysplastic nevus 03/08/2014   R lower back, moderate atypia   Hx of dysplastic nevus 01/08/2023   L medial heel, moderate atypia   Migraine    UTI (urinary tract infection)    Vitamin D deficiency     Past Surgical History:  Procedure Laterality Date   CESAREAN SECTION  11/05/1990   COLONOSCOPY  2012   COLONOSCOPY WITH PROPOFOL N/A 10/30/2020   Procedure: COLONOSCOPY WITH PROPOFOL;  Surgeon: Wyline Mood, MD;  Location: [facility] ENDOSCOPY;  Service: Gastroenterology;  Laterality: N/A;   DEBRIDEMENT TENNIS ELBOW Right 12/2006   Right elbow   LAPAROSCOPIC APPENDECTOMY N/A 08/29/2022   Procedure: APPENDECTOMY LAPAROSCOPIC;  Surgeon: Leafy Ro, MD;  Location: [facility] ORS;  Service: General;  Laterality: N/A;   LIVER BIOPSY N/A 08/29/2022   Procedure: LIVER BIOPSY;  Surgeon: Leafy Ro, MD;  Location: [facility] ORS;  Service: General;  Laterality: N/A;    There were no vitals filed for this visit.    PCP: Sherlene Shams,  MD   REFERRING PROVIDER: Teryl Lucy, MD   REFERRING DIAG: L Hamstring, LBP   THERAPY DIAG:  Pain in left hip  Sciatica, left side  Pain in left thigh  Rationale for Evaluation and Treatment: Rehabilitation  ONSET DATE: 03/03/23   Date PT referral signed.     Rationale for Evaluation and Treatment Rehabilitation  PERTINENT HISTORY: LBP and L hamstring pain. Low back has always been tight but its just the way it is. Her main complaint is her L posterior hip pain which began several months ago. Prolonged sitting bothers her L hip. Sleeping on her L side, L lateral hip (greater trochanter) and L anterior thigh (femoral nerve) would be painful. Started taking her Meloxicam 5 days ago (Saturday) which made her L posterior hip feel better.   PRECAUTIONS:   SUBJECTIVE:   SUBJECTIVE STATEMENT: L hip is alright. No pain currently. Still twingy at times. Pressure on L lateral hip can send an ache to her L thigh (around the L4 dermatome, not past the knees). Has twinges L posterior hip to L greater trochanter (along L piriformis muscle). Symptoms are all less since the very beginning. Has not taken Meloxicam for the past 2 weeks.      PAIN:  Are you having pain? See  subjective   TODAY'S TREATMENT:                                                                                                                                         DATE: 03/31/2023  No latex allergies  No BP problems per pt.   No osteoporosis per pt.     No TTP B greater trochanter  (+) Slump L LE with reproduction of L posterior thigh (sciatic symtpoms); L femoral nerve symptoms with thoracic extension; (+) Slump R LE with sciatic symptoms; (-) L piriformis test  L ischial tuberosity symptoms with L medial hamstrings eccentric contraction.    TTP L posterior hip around the sciatic area (superior to ischial tuberosity); TTP L PSIS. Muscle tension L lumbar paraspinals.     Therapeutic  exercise  Seated clam shells: GTB, 15x, then 3x12, 5 sec holds  Seated L hip ER green band 10x5 seconds 3/10 L posterior hip discomfort  Seated L piriformis stretch 30 seconds x 3  Seated hip IR 10x5 seconds, then with red band around ankles 10x3  No L posterior hip discomfort afterwards.   Seated hip adduction isometrics, small green ball squeeze 10x then 10x5 seconds for 2 sets    Standing dead lifts   R/L 3x10/LE with SUE support     Improved exercise technique, movement at target joints, use of target muscles after mod verbal, visual, tactile cues.     Response to treatment Pt tolerated session well without aggravation of symptoms.      Clinical impression Worked on concentric loading of L piriformis to promote healing at proximal and distal insertion points. Pt making overall progress with L posterior hip comfort level based on subjective reports. Pt tolerated session well without aggravation of symptoms.  Pt will benefit from continued skilled physical therapy services to decrease pain, improve strength and function.    PATIENT EDUCATION: Education details: there-ex, HEP Person educated: Patient Education method: Explanation, Demonstration, Tactile cues, and Verbal cues, handout Education comprehension: verbalized understanding and returned demonstration  HOME EXERCISE PROGRAM: Home exercise program Access Code: [email or web address] URL: [email or web address] Date: 03/06/2023 Prepared by: Loralyn Freshwater  Exercises - Prone on Elbows Stretch  - 2 x daily - 7 x weekly - 1 sets - 2 reps - 2 minutes hold  - Standing Sidebends  - 1 x daily - 7 x weekly - 3 sets - 10 reps - 5 seconds hold  - Seated Piriformis Stretch  - 3 x daily - 7 x weekly - 1 sets - 3 reps - 30 seconds hold  - Standing Lumbar Extension  - 1 x daily - 7 x weekly - 3 sets - 10 reps - 5 seconds hold  - Prone Press Up  - 1 x daily - 7 x weekly - 3 sets - 10 reps - 5 seconds hold  Sitting with  lumbar towel roll  - Prone  Hip Extension with Bent Knee  - 1 x daily - 7 x weekly - 3 sets - 10 reps - 5 seconds hold   - Seated Thoracic Lumbar Extension  - 1 x daily - 7 x weekly - 3 sets - 10 reps - 5 seconds hold  - Seated Cervical Retraction  - 1 x daily - 7 x weekly - 3 sets - 10 reps - 5 seconds hold        PT Short Term Goals - 03/21/23 0735       PT SHORT TERM GOAL #1   Title Pt will be independent with her initial HEP to decrease pain, improve strength and ability to tolerated sitting and L side lying more comfortably.    Baseline Pt has started her initial HEP (03/06/2023); doing her HEP, no questions (03/21/2023)    Time 3    Period Weeks    Status Achieved    Target Date 03/28/23               PT Long Term Goals - 03/21/23 0735       PT LONG TERM GOAL #1   Title Pt will have a decrease in L posterior hip pain to 1/10 or less at worst to promote ability to tolerate sitting and L side lying more comfortably.    Baseline 5/10 L posterior hip pain at worst for the past 3 months (03/06/2023); 1/10 L posterior hip pain at most for the past 7 days (03/21/2023)    Time 8    Period Weeks    Status Achieved    Target Date 05/02/23      PT LONG TERM GOAL #2   Title Pt will improve L hip extension and abduction strength by at least 1/2 MMT grade to promote ability to perform tasks which involve bending over more comfortably.    Baseline L hip extension 4-/5 (with L low back pain), hip abduction 4-/5 (03/06/2023)    Time 8    Period Weeks    Status On-going    Target Date 05/02/23      PT LONG TERM GOAL #3   Title Pt will improve her FOTO score by at least 10 points as a demonstration of improved function.    Baseline L upper leg FOTO 77 (03/06/2023), (03/21/2023)    Time 8    Period Weeks    Status On-going    Target Date 05/02/23                    Plan - 03/31/23 1030     Clinical Impression Statement Worked on concentric loading of L piriformis to  promote healing at proximal and distal insertion points. Pt making overall progress with L posterior hip comfort level based on subjective reports. Pt tolerated session well without aggravation of symptoms.  Pt will benefit from continued skilled physical therapy services to decrease pain, improve strength and function.    Personal Factors and Comorbidities Comorbidity 2;Past/Current Experience;Fitness    Comorbidities Appendicitis, hx of C-section    Examination-Activity Limitations Sit;Sleep;Bend    Stability/Clinical Decision Making Stable/Uncomplicated    Rehab Potential Fair    PT Frequency 2x / week    PT Duration 8 weeks    PT Treatment/Interventions Therapeutic activities;Therapeutic exercise;Neuromuscular re-education;Patient/family education;Manual techniques;Dry needling;Spinal Manipulations;Joint Manipulations;Aquatic Therapy;Electrical Stimulation;Iontophoresis 4mg /ml Dexamethasone;Traction    PT Next Visit Plan posture, extension and L lumbar side bending, trunk and glute strengthening, L hamstrings loading, manual techniques, modalities PRN  PT Home Exercise Plan Medbridge Access Code: [email or web address]    Consulted and Agree with Plan of Care Patient              Patient will benefit from skilled therapeutic intervention in order to improve the following deficits and impairments:  Pain, Postural dysfunction, Improper body mechanics, Decreased strength  Visit Diagnosis: Pain in left hip  Sciatica, left side  Pain in left thigh     Problem List Patient Active Problem List   Diagnosis Date Noted   Tinea corporis 12/05/2022   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

## 2023-04-02 ENCOUNTER — Ambulatory Visit: Payer: BC Managed Care – PPO

## 2023-04-02 DIAGNOSIS — M5432 Sciatica, left side: Secondary | ICD-10-CM

## 2023-04-02 DIAGNOSIS — M79652 Pain in left thigh: Secondary | ICD-10-CM

## 2023-04-02 DIAGNOSIS — M25552 Pain in left hip: Secondary | ICD-10-CM

## 2023-04-02 NOTE — Therapy (Signed)
[facility] [address] [place], [address] Phone: [phone]   Fax:  [phone]  Physical Therapy Treatment  Patient Details  Name: Kristine Mcguire MRN: [id number] Date of Birth: 07-21-1961 Referring Provider (PT): Teryl Lucy, MD   Encounter Date: 04/02/2023   PT End of Session - 04/02/23 1431     Visit Number 7    Number of Visits 17    Date for PT Re-Evaluation 05/02/23    Authorization - Number of Visits 20    PT Start Time 1432    PT Stop Time 1511    PT Time Calculation (min) 39 min    Activity Tolerance Patient tolerated treatment well    Behavior During Therapy [facility] for tasks assessed/performed                  Past Medical History:  Diagnosis Date   Appendicitis 06/2022   Atypical mole 03/08/2014   L distal ant lat thigh, excised 04/26/2014   Hepatic steatosis    History of chicken pox    Hx of dysplastic nevus    R tricep, txted in past per patient   Hx of dysplastic nevus 03/08/2014   R lower back, moderate atypia   Hx of dysplastic nevus 01/08/2023   L medial heel, moderate atypia   Migraine    UTI (urinary tract infection)    Vitamin D deficiency     Past Surgical History:  Procedure Laterality Date   CESAREAN SECTION  11/05/1990   COLONOSCOPY  2012   COLONOSCOPY WITH PROPOFOL N/A 10/30/2020   Procedure: COLONOSCOPY WITH PROPOFOL;  Surgeon: Wyline Mood, MD;  Location: [facility] ENDOSCOPY;  Service: Gastroenterology;  Laterality: N/A;   DEBRIDEMENT TENNIS ELBOW Right 12/2006   Right elbow   LAPAROSCOPIC APPENDECTOMY N/A 08/29/2022   Procedure: APPENDECTOMY LAPAROSCOPIC;  Surgeon: Leafy Ro, MD;  Location: [facility] ORS;  Service: General;  Laterality: N/A;   LIVER BIOPSY N/A 08/29/2022   Procedure: LIVER BIOPSY;  Surgeon: Leafy Ro, MD;  Location: [facility] ORS;  Service: General;  Laterality: N/A;    There were no vitals filed for this visit.    PCP: Sherlene Shams,  MD   REFERRING PROVIDER: Teryl Lucy, MD   REFERRING DIAG: L Hamstring, LBP   THERAPY DIAG:  Pain in left hip  Sciatica, left side  Pain in left thigh  Rationale for Evaluation and Treatment: Rehabilitation  ONSET DATE: 03/03/23   Date PT referral signed.     Rationale for Evaluation and Treatment Rehabilitation  PERTINENT HISTORY: LBP and L hamstring pain. Low back has always been tight but its just the way it is. Her main complaint is her L posterior hip pain which began several months ago. Prolonged sitting bothers her L hip. Sleeping on her L side, L lateral hip (greater trochanter) and L anterior thigh (femoral nerve) would be painful. Started taking her Meloxicam 5 days ago (Saturday) which made her L posterior hip feel better.   PRECAUTIONS:   SUBJECTIVE:   SUBJECTIVE STATEMENT: Currently tight from head to toe. No L hip pain currently. Was fine after last session and yesterday. Not so much pain but tight. Able to sit for an hour without pain. L hip did not bother her last night, does not know if she slept on her side. Sitting at home or restaurant does not bother her. The long car rides (such as 3 hours) makes her hip uncomfortable. Back is tight  in the morning but when she gets going and moving, it feels better.       PAIN:  Are you having pain? See subjective   TODAY'S TREATMENT:                                                                                                                                         DATE: 04/02/2023  No latex allergies  No BP problems per pt.   No osteoporosis per pt.     No TTP B greater trochanter  (+) Slump L LE with reproduction of L posterior thigh (sciatic symtpoms); L femoral nerve symptoms with thoracic extension; (+) Slump R LE with sciatic symptoms; (-) L piriformis test  L ischial tuberosity symptoms with L medial hamstrings eccentric contraction.    TTP L posterior hip around the sciatic area (superior  to ischial tuberosity); TTP L PSIS. Muscle tension L lumbar paraspinals.     Therapeutic exercise  Seated clam shells: GTB 3x12, 5 sec holds  Seated L hip ER green band 10x5 seconds No L posterior hip discomfort today  Seated L piriformis stretch 30 seconds x 5  Seated hip IR red band around ankles 10x3   Seated hip adduction isometrics, small green ball squeeze 10x5 seconds for 3 sets  Seated LE neural flossing   L 10x3   Seated thoracic extension on chair 10x2 with 5 second holds  Standing dead lifts   R/L 3x10/LE with SUE support  Seated knee flexion   L 10x3 with red band  Bridge 10x5 second holds    Improved exercise technique, movement at target joints, use of target muscles after mod verbal, visual, tactile cues.     Response to treatment Pt tolerated session well without aggravation of symptoms.      Clinical impression Continued working on concentric loading of L piriformis and L hamstrings to promote healing at affected insertions points  Pt tolerated session well without aggravation of symptoms.  Pt will benefit from continued skilled physical therapy services to decrease pain, improve strength and function.       PATIENT EDUCATION: Education details: there-ex, HEP Person educated: Patient Education method: Explanation, Demonstration, Tactile cues, and Verbal cues, handout Education comprehension: verbalized understanding and returned demonstration  HOME EXERCISE PROGRAM: Home exercise program Access Code: [email or web address] URL: [email or web address] Date: 03/06/2023 Prepared by: Loralyn Freshwater  Exercises - Prone on Elbows Stretch  - 2 x daily - 7 x weekly - 1 sets - 2 reps - 2 minutes hold  - Standing Sidebends  - 1 x daily - 7 x weekly - 3 sets - 10 reps - 5 seconds hold  - Seated Piriformis Stretch  - 3 x daily - 7 x weekly - 1 sets - 3 reps - 30 seconds hold  - Standing Lumbar Extension  - 1 x daily - 7 x weekly - 3 sets - 10 reps  -  5 seconds hold  - Prone Press Up  - 1 x daily - 7 x weekly - 3 sets - 10 reps - 5 seconds hold  Sitting with lumbar towel roll  - Prone Hip Extension with Bent Knee  - 1 x daily - 7 x weekly - 3 sets - 10 reps - 5 seconds hold   - Seated Thoracic Lumbar Extension  - 1 x daily - 7 x weekly - 3 sets - 10 reps - 5 seconds hold  - Seated Cervical Retraction  - 1 x daily - 7 x weekly - 3 sets - 10 reps - 5 seconds hold        PT Short Term Goals - 03/21/23 0735       PT SHORT TERM GOAL #1   Title Pt will be independent with her initial HEP to decrease pain, improve strength and ability to tolerated sitting and L side lying more comfortably.    Baseline Pt has started her initial HEP (03/06/2023); doing her HEP, no questions (03/21/2023)    Time 3    Period Weeks    Status Achieved    Target Date 03/28/23               PT Long Term Goals - 03/21/23 0735       PT LONG TERM GOAL #1   Title Pt will have a decrease in L posterior hip pain to 1/10 or less at worst to promote ability to tolerate sitting and L side lying more comfortably.    Baseline 5/10 L posterior hip pain at worst for the past 3 months (03/06/2023); 1/10 L posterior hip pain at most for the past 7 days (03/21/2023)    Time 8    Period Weeks    Status Achieved    Target Date 05/02/23      PT LONG TERM GOAL #2   Title Pt will improve L hip extension and abduction strength by at least 1/2 MMT grade to promote ability to perform tasks which involve bending over more comfortably.    Baseline L hip extension 4-/5 (with L low back pain), hip abduction 4-/5 (03/06/2023)    Time 8    Period Weeks    Status On-going    Target Date 05/02/23      PT LONG TERM GOAL #3   Title Pt will improve her FOTO score by at least 10 points as a demonstration of improved function.    Baseline L upper leg FOTO 77 (03/06/2023), (03/21/2023)    Time 8    Period Weeks    Status On-going    Target Date 05/02/23                     Plan - 04/02/23 1429     Clinical Impression Statement Continued working on concentric loading of L piriformis and L hamstrings to promote healing at affected insertions points  Pt tolerated session well without aggravation of symptoms.  Pt will benefit from continued skilled physical therapy services to decrease pain, improve strength and function.    Personal Factors and Comorbidities Comorbidity 2;Past/Current Experience;Fitness    Comorbidities Appendicitis, hx of C-section    Examination-Activity Limitations Sit;Sleep;Bend    Stability/Clinical Decision Making Stable/Uncomplicated    Rehab Potential Fair    PT Frequency 2x / week    PT Duration 8 weeks    PT Treatment/Interventions Therapeutic activities;Therapeutic exercise;Neuromuscular re-education;Patient/family education;Manual techniques;Dry needling;Spinal Manipulations;Joint Manipulations;Aquatic Therapy;Electrical Stimulation;Iontophoresis 4mg /ml Dexamethasone;Traction  PT Next Visit Plan posture, extension and L lumbar side bending, trunk and glute strengthening, L hamstrings loading, manual techniques, modalities PRN    PT Home Exercise Plan Medbridge Access Code: [email or web address]    Consulted and Agree with Plan of Care Patient              Patient will benefit from skilled therapeutic intervention in order to improve the following deficits and impairments:  Pain, Postural dysfunction, Improper body mechanics, Decreased strength  Visit Diagnosis: Pain in left hip  Sciatica, left side  Pain in left thigh     Problem List Patient Active Problem List   Diagnosis Date Noted   Tinea corporis 12/05/2022   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

## 2023-04-09 ENCOUNTER — Ambulatory Visit: Payer: BC Managed Care – PPO

## 2023-04-09 DIAGNOSIS — M25552 Pain in left hip: Secondary | ICD-10-CM

## 2023-04-09 DIAGNOSIS — M5432 Sciatica, left side: Secondary | ICD-10-CM

## 2023-04-09 DIAGNOSIS — M79652 Pain in left thigh: Secondary | ICD-10-CM

## 2023-04-09 NOTE — Therapy (Signed)
[facility] [address] [place], [address] Phone: [phone]   Fax:  [phone]  Physical Therapy Treatment  Patient Details  Name: Kristine Mcguire MRN: [id number] Date of Birth: 1961-07-25 Referring Provider (PT): Teryl Lucy, MD   Encounter Date: 04/09/2023   PT End of Session - 04/09/23 1435     Visit Number 8    Number of Visits 17    Date for PT Re-Evaluation 05/02/23    Authorization - Number of Visits 20    PT Start Time 1435    PT Stop Time 1513    PT Time Calculation (min) 38 min    Activity Tolerance Patient tolerated treatment well    Behavior During Therapy [facility] for tasks assessed/performed                   Past Medical History:  Diagnosis Date   Appendicitis 06/2022   Atypical mole 03/08/2014   L distal ant lat thigh, excised 04/26/2014   Hepatic steatosis    History of chicken pox    Hx of dysplastic nevus    R tricep, txted in past per patient   Hx of dysplastic nevus 03/08/2014   R lower back, moderate atypia   Hx of dysplastic nevus 01/08/2023   L medial heel, moderate atypia   Migraine    UTI (urinary tract infection)    Vitamin D deficiency     Past Surgical History:  Procedure Laterality Date   CESAREAN SECTION  11/05/1990   COLONOSCOPY  2012   COLONOSCOPY WITH PROPOFOL N/A 10/30/2020   Procedure: COLONOSCOPY WITH PROPOFOL;  Surgeon: Wyline Mood, MD;  Location: [facility] ENDOSCOPY;  Service: Gastroenterology;  Laterality: N/A;   DEBRIDEMENT TENNIS ELBOW Right 12/2006   Right elbow   LAPAROSCOPIC APPENDECTOMY N/A 08/29/2022   Procedure: APPENDECTOMY LAPAROSCOPIC;  Surgeon: Leafy Ro, MD;  Location: [facility] ORS;  Service: General;  Laterality: N/A;   LIVER BIOPSY N/A 08/29/2022   Procedure: LIVER BIOPSY;  Surgeon: Leafy Ro, MD;  Location: [facility] ORS;  Service: General;  Laterality: N/A;    There were no vitals filed for this visit.    PCP: Sherlene Shams,  MD   REFERRING PROVIDER: Teryl Lucy, MD   REFERRING DIAG: L Hamstring, LBP   THERAPY DIAG:  Pain in left hip  Pain in left thigh  Sciatica, left side  Rationale for Evaluation and Treatment: Rehabilitation  ONSET DATE: 03/03/23   Date PT referral signed.     Rationale for Evaluation and Treatment Rehabilitation  PERTINENT HISTORY: LBP and L hamstring pain. Low back has always been tight but its just the way it is. Her main complaint is her L posterior hip pain which began several months ago. Prolonged sitting bothers her L hip. Sleeping on her L side, L lateral hip (greater trochanter) and L anterior thigh (femoral nerve) would be painful. Started taking her Meloxicam 5 days ago (Saturday) which made her L posterior hip feel better.   PRECAUTIONS:   SUBJECTIVE:   SUBJECTIVE STATEMENT: Low back and L hip are doing really well. Sleeping, Lying down on L side is better.        PAIN:  Are you having pain? See subjective   TODAY'S TREATMENT:  DATE: 04/09/2023  No latex allergies  No BP problems per pt.   No osteoporosis per pt.     No TTP B greater trochanter  (+) Slump L LE with reproduction of L posterior thigh (sciatic symtpoms); L femoral nerve symptoms with thoracic extension; (+) Slump R LE with sciatic symptoms; (-) L piriformis test  L ischial tuberosity symptoms with L medial hamstrings eccentric contraction.    TTP L posterior hip around the sciatic area (superior to ischial tuberosity); TTP L PSIS. Muscle tension L lumbar paraspinals.     Therapeutic exercise  Seated LE neural flossing   L 10x3   Seated L hip ER green band 10x5 seconds for 2 sets  Standing dead lifts   R/L 3x10/LE with SUE support  Seated clam shells: GTB 3x12, 5 sec holds  Seated hip adduction isometrics, small green ball squeeze  10x5 seconds for 3 sets  Seated L piriformis stretch 30 seconds x 5  Seated hip IR red band around ankles 10x, then 10x5 seconds for 2 sets   Seated knee flexion   L 10x2 with red band  Seated thoracic extension on chair 10x2 with 5 second holds   Bridge 10x5 second holds      Improved exercise technique, movement at target joints, use of target muscles after mod verbal, visual, tactile cues.     Response to treatment Pt tolerated session well without aggravation of symptoms.      Clinical impression  Improving back and hip pain based on subjective reports. Continued working on concentric loading of L piriformis and L hamstrings to promote healing at affected insertions points  Continued with glute strengthening and thoracic extension to decrease stress to low back. Pt tolerated session well without aggravation of symptoms.  Pt will benefit from continued skilled physical therapy services to decrease pain, improve strength and function.       PATIENT EDUCATION: Education details: there-ex, HEP Person educated: Patient Education method: Explanation, Demonstration, Tactile cues, and Verbal cues, handout Education comprehension: verbalized understanding and returned demonstration  HOME EXERCISE PROGRAM: Home exercise program Access Code: [email or web address] URL: [email or web address] Date: 03/06/2023 Prepared by: Loralyn Freshwater  Exercises - Prone on Elbows Stretch  - 2 x daily - 7 x weekly - 1 sets - 2 reps - 2 minutes hold  - Standing Sidebends  - 1 x daily - 7 x weekly - 3 sets - 10 reps - 5 seconds hold  - Seated Piriformis Stretch  - 3 x daily - 7 x weekly - 1 sets - 3 reps - 30 seconds hold  - Standing Lumbar Extension  - 1 x daily - 7 x weekly - 3 sets - 10 reps - 5 seconds hold  - Prone Press Up  - 1 x daily - 7 x weekly - 3 sets - 10 reps - 5 seconds hold  Sitting with lumbar towel roll  - Prone Hip Extension with Bent Knee  - 1 x daily - 7 x weekly  - 3 sets - 10 reps - 5 seconds hold   - Seated Thoracic Lumbar Extension  - 1 x daily - 7 x weekly - 3 sets - 10 reps - 5 seconds hold  - Seated Cervical Retraction  - 1 x daily - 7 x weekly - 3 sets - 10 reps - 5 seconds hold   - LE neural flossing  - 3 x daily - 7 x weekly - 3 sets - 10 reps     PT  Short Term Goals - 03/21/23 0735       PT SHORT TERM GOAL #1   Title Pt will be independent with her initial HEP to decrease pain, improve strength and ability to tolerated sitting and L side lying more comfortably.    Baseline Pt has started her initial HEP (03/06/2023); doing her HEP, no questions (03/21/2023)    Time 3    Period Weeks    Status Achieved    Target Date 03/28/23               PT Long Term Goals - 03/21/23 0735       PT LONG TERM GOAL #1   Title Pt will have a decrease in L posterior hip pain to 1/10 or less at worst to promote ability to tolerate sitting and L side lying more comfortably.    Baseline 5/10 L posterior hip pain at worst for the past 3 months (03/06/2023); 1/10 L posterior hip pain at most for the past 7 days (03/21/2023)    Time 8    Period Weeks    Status Achieved    Target Date 05/02/23      PT LONG TERM GOAL #2   Title Pt will improve L hip extension and abduction strength by at least 1/2 MMT grade to promote ability to perform tasks which involve bending over more comfortably.    Baseline L hip extension 4-/5 (with L low back pain), hip abduction 4-/5 (03/06/2023)    Time 8    Period Weeks    Status On-going    Target Date 05/02/23      PT LONG TERM GOAL #3   Title Pt will improve her FOTO score by at least 10 points as a demonstration of improved function.    Baseline L upper leg FOTO 77 (03/06/2023), (03/21/2023)    Time 8    Period Weeks    Status On-going    Target Date 05/02/23                    Plan - 04/09/23 1433     Clinical Impression Statement Improving back and hip pain based on subjective reports. Continued  working on concentric loading of L piriformis and L hamstrings to promote healing at affected insertions points  Continued with glute strengthening and thoracic extension to decrease stress to low back. Pt tolerated session well without aggravation of symptoms.  Pt will benefit from continued skilled physical therapy services to decrease pain, improve strength and function.    Personal Factors and Comorbidities Comorbidity 2;Past/Current Experience;Fitness    Comorbidities Appendicitis, hx of C-section    Examination-Activity Limitations Sit;Sleep;Bend    Stability/Clinical Decision Making Stable/Uncomplicated    Rehab Potential Fair    PT Frequency 2x / week    PT Duration 8 weeks    PT Treatment/Interventions Therapeutic activities;Therapeutic exercise;Neuromuscular re-education;Patient/family education;Manual techniques;Dry needling;Spinal Manipulations;Joint Manipulations;Aquatic Therapy;Electrical Stimulation;Iontophoresis 4mg /ml Dexamethasone;Traction    PT Next Visit Plan posture, extension and L lumbar side bending, trunk and glute strengthening, L hamstrings loading, manual techniques, modalities PRN    PT Home Exercise Plan Medbridge Access Code: [email or web address]    Consulted and Agree with Plan of Care Patient               Patient will benefit from skilled therapeutic intervention in order to improve the following deficits and impairments:  Pain, Postural dysfunction, Improper body mechanics, Decreased strength  Visit Diagnosis: Pain in left hip  Pain in left  thigh  Sciatica, left side     Problem List Patient Active Problem List   Diagnosis Date Noted   Tinea corporis 12/05/2022   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

## 2023-04-15 ENCOUNTER — Ambulatory Visit: Payer: BC Managed Care – PPO

## 2023-04-21 ENCOUNTER — Ambulatory Visit: Payer: BC Managed Care – PPO | Attending: Orthopedic Surgery

## 2023-04-21 DIAGNOSIS — M5432 Sciatica, left side: Secondary | ICD-10-CM | POA: Insufficient documentation

## 2023-04-21 DIAGNOSIS — M25552 Pain in left hip: Secondary | ICD-10-CM | POA: Diagnosis present

## 2023-04-21 DIAGNOSIS — M79652 Pain in left thigh: Secondary | ICD-10-CM | POA: Diagnosis present

## 2023-04-21 NOTE — Therapy (Signed)
[facility] [address] [place], [address] Phone: [phone]   Fax:  [phone]  Physical Therapy Treatment  Patient Details  Name: Kristine Mcguire MRN: [id number] Date of Birth: 02-18-1961 Referring Provider (PT): Teryl Lucy, MD   Encounter Date: 04/21/2023   PT End of Session - 04/21/23 1521     Visit Number 9    Number of Visits 17    Date for PT Re-Evaluation 05/02/23    Authorization - Number of Visits 20    PT Start Time 1521    PT Stop Time 1600    PT Time Calculation (min) 39 min    Activity Tolerance Patient tolerated treatment well    Behavior During Therapy [facility] for tasks assessed/performed                    Past Medical History:  Diagnosis Date   Appendicitis 06/2022   Atypical mole 03/08/2014   L distal ant lat thigh, excised 04/26/2014   Hepatic steatosis    History of chicken pox    Hx of dysplastic nevus    R tricep, txted in past per patient   Hx of dysplastic nevus 03/08/2014   R lower back, moderate atypia   Hx of dysplastic nevus 01/08/2023   L medial heel, moderate atypia   Migraine    UTI (urinary tract infection)    Vitamin D deficiency     Past Surgical History:  Procedure Laterality Date   CESAREAN SECTION  11/05/1990   COLONOSCOPY  2012   COLONOSCOPY WITH PROPOFOL N/A 10/30/2020   Procedure: COLONOSCOPY WITH PROPOFOL;  Surgeon: Wyline Mood, MD;  Location: [facility] ENDOSCOPY;  Service: Gastroenterology;  Laterality: N/A;   DEBRIDEMENT TENNIS ELBOW Right 12/2006   Right elbow   LAPAROSCOPIC APPENDECTOMY N/A 08/29/2022   Procedure: APPENDECTOMY LAPAROSCOPIC;  Surgeon: Leafy Ro, MD;  Location: [facility] ORS;  Service: General;  Laterality: N/A;   LIVER BIOPSY N/A 08/29/2022   Procedure: LIVER BIOPSY;  Surgeon: Leafy Ro, MD;  Location: [facility] ORS;  Service: General;  Laterality: N/A;    There were no vitals filed for this visit.    PCP: Sherlene Shams, MD   REFERRING PROVIDER: Teryl Lucy, MD   REFERRING DIAG: L Hamstring, LBP   THERAPY DIAG:  Pain in left hip  Pain in left thigh  Sciatica, left side  Rationale for Evaluation and Treatment: Rehabilitation  ONSET DATE: 03/03/23   Date PT referral signed.     Rationale for Evaluation and Treatment Rehabilitation  PERTINENT HISTORY: LBP and L hamstring pain. Low back has always been tight but its just the way it is. Her main complaint is her L posterior hip pain which began several months ago. Prolonged sitting bothers her L hip. Sleeping on her L side, L lateral hip (greater trochanter) and L anterior thigh (femoral nerve) would be painful. Started taking her Meloxicam 5 days ago (Saturday) which made her L posterior hip feel better.   PRECAUTIONS:   SUBJECTIVE:   SUBJECTIVE STATEMENT: Low back and L hip are good, no pain currently. Going to [facility] on Thursday and coming back Sunday. 1 hour drives do not bother her. All and all, doing great.         PAIN:  Are you having pain? See subjective   TODAY'S TREATMENT:  DATE: 04/21/2023  No latex allergies  No BP problems per pt.   No osteoporosis per pt.     No TTP B greater trochanter  (+) Slump L LE with reproduction of L posterior thigh (sciatic symtpoms); L femoral nerve symptoms with thoracic extension; (+) Slump R LE with sciatic symptoms; (-) L piriformis test  L ischial tuberosity symptoms with L medial hamstrings eccentric contraction.    TTP L posterior hip around the sciatic area (superior to ischial tuberosity); TTP L PSIS. Muscle tension L lumbar paraspinals.     Therapeutic exercise   Prone manually resisted hip extension and S/L hip abduction for L side  FOTO 99   Reviewed progress with PT towards goals  Reviewed POC: 1 more visit next  week to see how her symptoms tolerate the 3 hour drive. Pt verbalized understanding.   Seated LE neural flossing   L 10x2  Seated L hip ER green band 10x5 seconds for 2 sets  Seated L piriformis stretch 30 seconds x 5  Seated hip adduction isometrics, small green ball squeeze 10x5 seconds for 2 sets  Seated hip IR red band around ankles 10x5 seconds for 3 sets   Seated knee flexion   L 10x2 with red band  Standing dead lifts   R/L 3x10/LE with SUE support   Seated thoracic extension on chair 10x3 with 5 second holds       Improved exercise technique, movement at target joints, use of target muscles after mod verbal, visual, tactile cues.     Response to treatment Pt tolerated session well without aggravation of symptoms.      Clinical impression  Pt making very good progress with PT towards goals. Pt going on a 3 hour drive later this week and will report back how her  posterior hip does.  Continued working on concentric loading of L piriformis and L hamstrings to promote healing at affected insertions points  Continued with glute strengthening and thoracic extension to decrease stress to low back. Pt tolerated session well without aggravation of symptoms.  Pt will benefit from continued skilled physical therapy services to decrease pain, improve strength and function.       PATIENT EDUCATION: Education details: there-ex, HEP Person educated: Patient Education method: Explanation, Demonstration, Tactile cues, and Verbal cues, handout Education comprehension: verbalized understanding and returned demonstration  HOME EXERCISE PROGRAM: Home exercise program Access Code: [email or web address] URL: [email or web address] Date: 03/06/2023 Prepared by: Loralyn Freshwater  Exercises - Prone on Elbows Stretch  - 2 x daily - 7 x weekly - 1 sets - 2 reps - 2 minutes hold  - Standing Sidebends  - 1 x daily - 7 x weekly - 3 sets - 10 reps - 5 seconds hold  - Seated  Piriformis Stretch  - 3 x daily - 7 x weekly - 1 sets - 3 reps - 30 seconds hold  - Standing Lumbar Extension  - 1 x daily - 7 x weekly - 3 sets - 10 reps - 5 seconds hold  - Prone Press Up  - 1 x daily - 7 x weekly - 3 sets - 10 reps - 5 seconds hold  Sitting with lumbar towel roll  - Prone Hip Extension with Bent Knee  - 1 x daily - 7 x weekly - 3 sets - 10 reps - 5 seconds hold   - Seated Thoracic Lumbar Extension  - 1 x daily - 7 x weekly - 3 sets - 10 reps - 5  seconds hold  - Seated Cervical Retraction  - 1 x daily - 7 x weekly - 3 sets - 10 reps - 5 seconds hold   - LE neural flossing  - 3 x daily - 7 x weekly - 3 sets - 10 reps  - Seated Hip Adduction Isometrics with Ball  - 1 x daily - 7 x weekly - 3 sets - 10 reps - 5 seconds hold   PT Short Term Goals - 03/21/23 0735       PT SHORT TERM GOAL #1   Title Pt will be independent with her initial HEP to decrease pain, improve strength and ability to tolerated sitting and L side lying more comfortably.    Baseline Pt has started her initial HEP (03/06/2023); doing her HEP, no questions (03/21/2023)    Time 3    Period Weeks    Status Achieved    Target Date 03/28/23               PT Long Term Goals - 04/21/23 1526       PT LONG TERM GOAL #1   Title Pt will have a decrease in L posterior hip pain to 1/10 or less at worst to promote ability to tolerate sitting and L side lying more comfortably.    Baseline 5/10 L posterior hip pain at worst for the past 3 months (03/06/2023); 1/10 L posterior hip pain at most for the past 7 days (03/21/2023); 0/10 at most for the past 7 days (04/21/2023)    Time 8    Period Weeks    Status Achieved    Target Date 05/02/23      PT LONG TERM GOAL #2   Title Pt will improve L hip extension and abduction strength by at least 1/2 MMT grade to promote ability to perform tasks which involve bending over more comfortably.    Baseline L hip extension 4-/5 (with L low back pain), hip abduction 4-/5  (03/06/2023); L hip extension 4/5, no pain, L hip abduction 4+/5 (04/21/2023)    Time 8    Period Weeks    Status Achieved    Target Date 05/02/23      PT LONG TERM GOAL #3   Title Pt will improve her FOTO score by at least 10 points as a demonstration of improved function.    Baseline L upper leg FOTO 77 (03/06/2023), (03/21/2023); 99 (04/21/2023)    Time 8    Period Weeks    Status Achieved    Target Date 05/02/23      PT LONG TERM GOAL #4   Title Pt will be able to tolerate about 3 hours of driving with minimal to no L posterior hip pain to promote ability to travel more comfortably.    Baseline Increased L posterior hip symptoms wiht driving for about 3 hours (04/21/2023)    Time 1    Period Weeks    Status New    Target Date 04/28/23                    Plan - 04/21/23 1520     Clinical Impression Statement Pt making very good progress with PT towards goals. Pt going on a 3 hour drive later this week and will report back how her  posterior hip does.  Continued working on concentric loading of L piriformis and L hamstrings to promote healing at affected insertions points  Continued with glute strengthening and thoracic extension to decrease  stress to low back. Pt tolerated session well without aggravation of symptoms.  Pt will benefit from continued skilled physical therapy services to decrease pain, improve strength and function.    Personal Factors and Comorbidities Comorbidity 2;Past/Current Experience;Fitness    Comorbidities Appendicitis, hx of C-section    Examination-Activity Limitations Sit;Sleep;Bend    Stability/Clinical Decision Making Stable/Uncomplicated    Rehab Potential Fair    PT Frequency 2x / week    PT Duration 8 weeks    PT Treatment/Interventions Therapeutic activities;Therapeutic exercise;Neuromuscular re-education;Patient/family education;Manual techniques;Dry needling;Spinal Manipulations;Joint Manipulations;Aquatic Therapy;Electrical  Stimulation;Iontophoresis 4mg /ml Dexamethasone;Traction    PT Next Visit Plan posture, extension and L lumbar side bending, trunk and glute strengthening, L hamstrings loading, manual techniques, modalities PRN    PT Home Exercise Plan Medbridge Access Code: [email or web address]    Consulted and Agree with Plan of Care Patient                Patient will benefit from skilled therapeutic intervention in order to improve the following deficits and impairments:  Pain, Postural dysfunction, Improper body mechanics, Decreased strength  Visit Diagnosis: Pain in left hip  Pain in left thigh  Sciatica, left side     Problem List Patient Active Problem List   Diagnosis Date Noted   Tinea corporis 12/05/2022   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

## 2023-04-23 ENCOUNTER — Ambulatory Visit: Payer: BC Managed Care – PPO

## 2023-04-28 ENCOUNTER — Ambulatory Visit: Payer: BC Managed Care – PPO

## 2023-04-28 DIAGNOSIS — M79652 Pain in left thigh: Secondary | ICD-10-CM

## 2023-04-28 DIAGNOSIS — M5432 Sciatica, left side: Secondary | ICD-10-CM

## 2023-04-28 DIAGNOSIS — M25552 Pain in left hip: Secondary | ICD-10-CM | POA: Diagnosis not present

## 2023-04-28 NOTE — Therapy (Signed)
[facility] [address], [place], [address] Phone: [phone]   Fax:  [phone]  Physical Therapy Treatment Progress Report (03/06/2023 - 04/28/2023) And Discharge Summary  Patient Details  Name: Kristine Mcguire MRN: [id number] Date of Birth: 05/29/1961 Referring Provider (PT): Teryl Lucy, MD   Encounter Date: 04/28/2023   PT End of Session - 04/28/23 1518     Visit Number 10    Number of Visits 17    Date for PT Re-Evaluation 05/02/23    Authorization - Number of Visits 20    PT Start Time 1518    PT Stop Time 1558    PT Time Calculation (min) 40 min    Activity Tolerance Patient tolerated treatment well    Behavior During Therapy [facility] for tasks assessed/performed                     Past Medical History:  Diagnosis Date   Appendicitis 06/2022   Atypical mole 03/08/2014   L distal ant lat thigh, excised 04/26/2014   Hepatic steatosis    History of chicken pox    Hx of dysplastic nevus    R tricep, txted in past per patient   Hx of dysplastic nevus 03/08/2014   R lower back, moderate atypia   Hx of dysplastic nevus 01/08/2023   L medial heel, moderate atypia   Migraine    UTI (urinary tract infection)    Vitamin D deficiency     Past Surgical History:  Procedure Laterality Date   CESAREAN SECTION  11/05/1990   COLONOSCOPY  2012   COLONOSCOPY WITH PROPOFOL N/A 10/30/2020   Procedure: COLONOSCOPY WITH PROPOFOL;  Surgeon: Wyline Mood, MD;  Location: [facility] ENDOSCOPY;  Service: Gastroenterology;  Laterality: N/A;   DEBRIDEMENT TENNIS ELBOW Right 12/2006   Right elbow   LAPAROSCOPIC APPENDECTOMY N/A 08/29/2022   Procedure: APPENDECTOMY LAPAROSCOPIC;  Surgeon: Leafy Ro, MD;  Location: [facility] ORS;  Service: General;  Laterality: N/A;   LIVER BIOPSY N/A 08/29/2022   Procedure: LIVER BIOPSY;  Surgeon: Leafy Ro, MD;  Location: [facility] ORS;  Service: General;  Laterality: N/A;     There were no vitals filed for this visit.    PCP: Sherlene Shams, MD   REFERRING PROVIDER: Teryl Lucy, MD   REFERRING DIAG: L Hamstring, LBP   THERAPY DIAG:  Pain in left hip  Pain in left thigh  Sciatica, left side  Rationale for Evaluation and Treatment: Rehabilitation  ONSET DATE: 03/03/23   Date PT referral signed.     Rationale for Evaluation and Treatment Rehabilitation  PERTINENT HISTORY: LBP and L hamstring pain. Low back has always been tight but its just the way it is. Her main complaint is her L posterior hip pain which began several months ago. Prolonged sitting bothers her L hip. Sleeping on her L side, L lateral hip (greater trochanter) and L anterior thigh (femoral nerve) would be painful. Started taking her Meloxicam 5 days ago (Saturday) which made her L posterior hip feel better.   PRECAUTIONS:   SUBJECTIVE:   SUBJECTIVE STATEMENT: The back did fine riding in the car. The R hip got a little tender after about 2.5 hours of driving. Used a tennis ball and did ok. Did a big hike (moderate) last Friday [place] and [place] the [place]. All and all, the legs and hips did ok. The R hamstrings were sore afterwards. Went to her exercise class this morning.  The L posterior hip did well with the 3 hour drive.  No low back, L posterior hip and thigh pain currently. No R posterior hip and posterior thigh currently. R posterior hip and thigh discomfort in standing and moving. Bending over causes B posterior hip and thigh discomfort.          PAIN:  Are you having pain? See subjective   TODAY'S TREATMENT:                                                                                                                                         DATE: 04/28/2023  No latex allergies  No BP problems per pt.   No osteoporosis per pt.     No TTP B greater trochanter  (+) Slump L LE with reproduction of L posterior thigh (sciatic symtpoms); L femoral nerve  symptoms with thoracic extension; (+) Slump R LE with sciatic symptoms; (-) L piriformis test  L ischial tuberosity symptoms with L medial hamstrings eccentric contraction.    TTP L posterior hip around the sciatic area (superior to ischial tuberosity); TTP L PSIS. Muscle tension L lumbar paraspinals.     Therapeutic exercise  Standing back extension 10x5 seconds for 2 sets  Standing side bends  R 10x5 seconds for 2 sets  L 10x5 seconds for 2 sets  Decreased B posterior thigh and hip symptoms with standing lumbar flexion afterwards.    Prone press-ups 10x5 seconds  Seated LE neural flossing   L 10x2  R 10x2   Decreased B posterior thigh and hip symptoms with standing lumbar flexion afterwards.    Seated piriformis stretch   R 30 seconds x 5  L 30 seconds x 5   Seated hip adduction isometrics, small green ball squeeze 10x5 seconds for 2 sets   Standing dead lifts   R/L 3x10/LE with SUE support       Improved exercise technique, movement at target joints, use of target muscles after mod verbal, visual, tactile cues.     Response to treatment Pt tolerated session well without aggravation of symptoms.      Clinical impression  Able to tolerate 3 hours driving without L posterior hip and thigh pain. Pt however demonstrates R posterior hip and thigh pain from the trip this past weekend. Worked on extension based lumbar exercises, piriformis stretch, and glute max strengthening for both sides. Improved  R and L posterior hip and thigh symptoms reported after treatment. Pt demonstrates decreased L posterior hip and thigh pain, improved strength, function, and ability to tolerate sitting for longer distance travel since initial evaluation. Pt has made very good progress with PT towards goals. Skilled physical therapy services discharged with pt continuing progress with her exercises at home.       PATIENT EDUCATION: Education details: there-ex, HEP Person  educated: Patient Education method: Explanation, Demonstration, Tactile cues, and Verbal cues, handout  Education comprehension: verbalized understanding and returned demonstration  HOME EXERCISE PROGRAM: Home exercise program Access Code: [email or web address] URL: [email or web address] Date: 03/06/2023 Prepared by: Loralyn Freshwater  Exercises - Prone on Elbows Stretch  - 2 x daily - 7 x weekly - 1 sets - 2 reps - 2 minutes hold  - Standing Sidebends  - 1 x daily - 7 x weekly - 3 sets - 10 reps - 5 seconds hold  - Seated Piriformis Stretch  - 3 x daily - 7 x weekly - 1 sets - 3 reps - 30 seconds hold  - Standing Lumbar Extension  - 1 x daily - 7 x weekly - 3 sets - 10 reps - 5 seconds hold  - Prone Press Up  - 1 x daily - 7 x weekly - 3 sets - 10 reps - 5 seconds hold  Sitting with lumbar towel roll  - Prone Hip Extension with Bent Knee  - 1 x daily - 7 x weekly - 3 sets - 10 reps - 5 seconds hold   - Seated Thoracic Lumbar Extension  - 1 x daily - 7 x weekly - 3 sets - 10 reps - 5 seconds hold  - Seated Cervical Retraction  - 1 x daily - 7 x weekly - 3 sets - 10 reps - 5 seconds hold   - LE neural flossing  - 3 x daily - 7 x weekly - 3 sets - 10 reps  - Seated Hip Adduction Isometrics with Ball  - 1 x daily - 7 x weekly - 3 sets - 10 reps - 5 seconds hold   PT Short Term Goals - 03/21/23 0735       PT SHORT TERM GOAL #1   Title Pt will be independent with her initial HEP to decrease pain, improve strength and ability to tolerated sitting and L side lying more comfortably.    Baseline Pt has started her initial HEP (03/06/2023); doing her HEP, no questions (03/21/2023)    Time 3    Period Weeks    Status Achieved    Target Date 03/28/23               PT Long Term Goals - 04/28/23 1548       PT LONG TERM GOAL #1   Title Pt will have a decrease in L posterior hip pain to 1/10 or less at worst to promote ability to tolerate sitting and L side lying more comfortably.     Baseline 5/10 L posterior hip pain at worst for the past 3 months (03/06/2023); 1/10 L posterior hip pain at most for the past 7 days (03/21/2023); 0/10 at most for the past 7 days (04/21/2023)    Time 8    Period Weeks    Status Achieved    Target Date 05/02/23      PT LONG TERM GOAL #2   Title Pt will improve L hip extension and abduction strength by at least 1/2 MMT grade to promote ability to perform tasks which involve bending over more comfortably.    Baseline L hip extension 4-/5 (with L low back pain), hip abduction 4-/5 (03/06/2023); L hip extension 4/5, no pain, L hip abduction 4+/5 (04/21/2023)    Time 8    Period Weeks    Status Achieved    Target Date 05/02/23      PT LONG TERM GOAL #3   Title Pt will improve her FOTO score by at least 10 points as  a demonstration of improved function.    Baseline L upper leg FOTO 77 (03/06/2023), (03/21/2023); 99 (04/21/2023)    Time 8    Period Weeks    Status Achieved    Target Date 05/02/23      PT LONG TERM GOAL #4   Title Pt will be able to tolerate about 3 hours of driving with minimal to no L posterior hip pain to promote ability to travel more comfortably.    Baseline Increased L posterior hip symptoms wiht driving for about 3 hours (04/21/2023); No L posterior hip symptoms during her 3 hour drive. Pt however had R posterior hip symptoms (04/28/2023)    Time 1    Period Weeks    Status Achieved    Target Date 04/28/23                    Plan - 04/28/23 1518     Clinical Impression Statement Able to tolerate 3 hours driving without L posterior hip and thigh pain. Pt however demonstrates R posterior hip and thigh pain from the trip this past weekend. Worked on extension based lumbar exercises, piriformis stretch, and glute max strengthening for both sides. Improved  R and L posterior hip and thigh symptoms reported after treatment. Pt demonstrates decreased L posterior hip and thigh pain, improved strength, function, and ability to  tolerate sitting for longer distance travel since initial evaluation. Pt has made very good progress with PT towards goals. Skilled physical therapy services discharged with pt continuing progress with her exercises at home.    Personal Factors and Comorbidities Comorbidity 2;Past/Current Experience;Fitness    Comorbidities Appendicitis, hx of C-section    Examination-Activity Limitations Sit;Sleep;Bend    Stability/Clinical Decision Making Stable/Uncomplicated    Rehab Potential --    PT Frequency --    PT Duration --    PT Treatment/Interventions Therapeutic activities;Therapeutic exercise;Neuromuscular re-education;Patient/family education;Manual techniques    PT Next Visit Plan Continue progress with her home exercise program    PT Home Exercise Plan Medbridge Access Code: [email or web address]    Consulted and Agree with Plan of Care Patient                 Patient will benefit from skilled therapeutic intervention in order to improve the following deficits and impairments:  Pain, Postural dysfunction, Improper body mechanics, Decreased strength  Visit Diagnosis: Pain in left hip  Pain in left thigh  Sciatica, left side     Problem List Patient Active Problem List   Diagnosis Date Noted   Tinea corporis 12/05/2022   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  Loralyn Freshwater PT, DPT  Physical Therapist- [facility]  04/28/2023, 4:10 PM

## 2023-05-29 ENCOUNTER — Encounter: Payer: Self-pay | Admitting: Internal Medicine

## 2023-05-29 DIAGNOSIS — E559 Vitamin D deficiency, unspecified: Secondary | ICD-10-CM

## 2023-05-29 DIAGNOSIS — K760 Fatty (change of) liver, not elsewhere classified: Secondary | ICD-10-CM

## 2023-05-29 DIAGNOSIS — E785 Hyperlipidemia, unspecified: Secondary | ICD-10-CM

## 2023-05-30 NOTE — Telephone Encounter (Signed)
Labs have been ordered.  3-4 hour fast is sufficient

## 2023-06-05 ENCOUNTER — Ambulatory Visit: Payer: BC Managed Care – PPO | Admitting: Internal Medicine

## 2023-06-05 ENCOUNTER — Encounter: Payer: Self-pay | Admitting: Internal Medicine

## 2023-06-05 VITALS — BP 112/70 | HR 63 | Temp 97.8°F | Ht 65.0 in | Wt 150.0 lb

## 2023-06-05 DIAGNOSIS — R7303 Prediabetes: Secondary | ICD-10-CM

## 2023-06-05 DIAGNOSIS — E785 Hyperlipidemia, unspecified: Secondary | ICD-10-CM | POA: Diagnosis not present

## 2023-06-05 DIAGNOSIS — Z23 Encounter for immunization: Secondary | ICD-10-CM

## 2023-06-05 DIAGNOSIS — R160 Hepatomegaly, not elsewhere classified: Secondary | ICD-10-CM

## 2023-06-05 DIAGNOSIS — K760 Fatty (change of) liver, not elsewhere classified: Secondary | ICD-10-CM | POA: Diagnosis not present

## 2023-06-05 DIAGNOSIS — E559 Vitamin D deficiency, unspecified: Secondary | ICD-10-CM | POA: Diagnosis not present

## 2023-06-05 DIAGNOSIS — F411 Generalized anxiety disorder: Secondary | ICD-10-CM

## 2023-06-05 DIAGNOSIS — G4700 Insomnia, unspecified: Secondary | ICD-10-CM

## 2023-06-05 LAB — COMPREHENSIVE METABOLIC PANEL
ALT: 19 U/L (ref 0–35)
AST: 19 U/L (ref 0–37)
Albumin: 4.4 g/dL (ref 3.5–5.2)
Alkaline Phosphatase: 91 U/L (ref 39–117)
BUN: 16 mg/dL (ref 6–23)
CO2: 29 meq/L (ref 19–32)
Calcium: 9.8 mg/dL (ref 8.4–10.5)
Chloride: 103 meq/L (ref 96–112)
Creatinine, Ser: 0.84 mg/dL (ref 0.40–1.20)
GFR: 73.94 mL/min (ref >60.00)
Glucose, Bld: 103 mg/dL — ABNORMAL HIGH (ref 70–99)
Potassium: 4.3 meq/L (ref 3.5–5.1)
Sodium: 139 meq/L (ref 135–145)
Total Bilirubin: 0.5 mg/dL (ref 0.2–1.2)
Total Protein: 6.9 g/dL (ref 6.0–8.3)

## 2023-06-05 LAB — LIPID PANEL
Cholesterol: 193 mg/dL (ref 0–200)
HDL: 59.7 mg/dL (ref >39.00)
LDL Cholesterol: 115 mg/dL — ABNORMAL HIGH (ref 0–99)
NonHDL: 133.03
Total CHOL/HDL Ratio: 3
Triglycerides: 92 mg/dL (ref 0.0–149.0)
VLDL: 18.4 mg/dL (ref 0.0–40.0)

## 2023-06-05 LAB — HEMOGLOBIN A1C: Hgb A1c MFr Bld: 5.7 % (ref 4.6–6.5)

## 2023-06-05 LAB — LDL CHOLESTEROL, DIRECT: Direct LDL: 108 mg/dL

## 2023-06-05 LAB — VITAMIN D 25 HYDROXY (VIT D DEFICIENCY, FRACTURES): VITD: 79.45 ng/mL (ref 30.00–100.00)

## 2023-06-05 LAB — TSH: TSH: 2.05 u[IU]/mL (ref 0.35–5.50)

## 2023-06-05 NOTE — Assessment & Plan Note (Signed)
Did not tolerate metformin due to persistent diarrhea.  Toletating crestor.

## 2023-06-05 NOTE — Patient Instructions (Addendum)
  You might want to try using Relaxium for insomnia  (as seen on TV commercials) . It is available through [employer] and contains all natural supplements:  Melatonin 5 mg  Chamomile 25 mg Passionflower extract 75 mg GABA 100 mg Ashwaganda extract 125 mg Magnesium citrate, glycinate, oxide (100 mg)  L tryptophan 500 mg Valerest (proprietary  ingredient ; probably valeria root extract)     You can add up to 2000 mg of acetominophen (tylenol) every day safely  In divided doses (500 mg every 6 hours  Or 1000 mg every 12 hours.)  SAFER THAN DAILY USE OF ALEVE , MOTRIN OR MELOXICAM   Taking an antibiotic can create an imbalance in the normal population of bacteria that live in the small intestine.  This imbalance can persist for 3 months.   Taking a probiotic ( Align, Floraque or Culturelle), the generic version of one of these over the counter medications, or an alternative form (kombucha,  Yogurt, or another dietary source) for a minimum of 3 weeks may help prevent a serious antibiotic associated diarrhea  Called clostridium dificile colitis that occurs when the bacteria population is altered .  Taking a probiotic may also prevent vaginitis due to yeast infections and can be continued indefinitely if you feel that it improves your digestion or your elimination (bowels).

## 2023-06-05 NOTE — Progress Notes (Signed)
Subjective:  Patient ID: Kristine Mcguire, female    DOB: [birth year]  Age: 62 y.o. MRN: [id number]  CC: The primary encounter diagnosis was Need for influenza vaccination. 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   HPI Kristine Mcguire presents for  Chief Complaint  Patient presents with   Medical Management of Chronic Issues   1) HLD/fatty liver:  tolerating crestor.  Exercising daily.  Did not tolerate metformin   2) GAD:  citalopram tolerated.    3) low back pain.SI joint pain resolved,  started after prlonged fligh ( to [place])   saw [facility] :  scoliosis ,  mild DDD.  Used meloxicam and PT    Outpatient Medications Prior to Visit  Medication Sig Dispense Refill   Cholecalciferol (VITAMIN D3) 1000 UNITS CHEW Chew 2 tablets by mouth daily.      citalopram (CELEXA) 10 MG tablet TAKE 1 TABLET BY MOUTH EVERY DAY 90 tablet 3   rosuvastatin (CRESTOR) 10 MG tablet TAKE 1 TABLET BY MOUTH EVERY DAY 90 tablet 1   meloxicam (MOBIC) 15 MG tablet Take 15 mg by mouth daily.     No facility-administered medications prior to visit.    Review of Systems;  Patient denies headache, fevers, malaise, unintentional weight loss, skin rash, eye pain, sinus congestion and sinus pain, sore throat, dysphagia,  hemoptysis , cough, dyspnea, wheezing, chest pain, palpitations, orthopnea, edema, abdominal pain, nausea, melena, diarrhea, constipation, flank pain, dysuria, hematuria, urinary  Frequency, nocturia, numbness, tingling, seizures,  Focal weakness, Loss of consciousness,  Tremor, insomnia, depression, anxiety, and suicidal ideation.      Objective:  BP 112/70   Pulse 63   Temp 97.8 F  (36.6 C) (Oral)   Ht 5\' 5"  (1.651 m)   Wt 150 lb (68 kg)   SpO2 97%   BMI 24.96 kg/m   BP Readings from Last 3 Encounters:  06/05/23 112/70  01/15/23 119/73  01/08/23 114/69    Wt Readings from Last 3 Encounters:  06/05/23 150 lb (68 kg)  01/15/23 153 lb 1.6 oz (69.4 kg)  12/05/22 150 lb 6.4 oz (68.2 kg)    Physical Exam Vitals reviewed.  Constitutional:      General: She is not in acute distress.    Appearance: Normal appearance. She is normal weight. She is not ill-appearing, toxic-appearing or diaphoretic.  HENT:     Head: Normocephalic.  Eyes:     General: No scleral icterus.       Right eye: No discharge.        Left eye: No discharge.     Conjunctiva/sclera: Conjunctivae normal.  Cardiovascular:     Rate and Rhythm: Normal rate and regular rhythm.     Heart sounds: Normal heart sounds.  Pulmonary:     Effort: Pulmonary effort is normal. No respiratory distress.     Breath sounds: Normal breath sounds.  Musculoskeletal:        General: Normal range of motion.  Skin:    General: Skin is warm and dry.  Neurological:     General: No focal deficit present.     Mental Status: She is alert and oriented to person, place, and time. Mental status is at baseline.  Psychiatric:  Mood and Affect: Mood normal.        Behavior: Behavior normal.        Thought Content: Thought content normal.        Judgment: Judgment normal.    Lab Results  Component Value Date   HGBA1C 5.7 06/05/2023   HGBA1C 5.8 11/25/2022   HGBA1C 5.7 11/28/2021    Lab Results  Component Value Date   CREATININE 0.84 06/05/2023   CREATININE 0.76 01/15/2023   CREATININE 0.85 12/27/2022    Lab Results  Component Value Date   WBC 7.4 01/15/2023   HGB 13.4 01/15/2023   HCT 38.4 01/15/2023   PLT 278 01/15/2023   GLUCOSE 103 (H) 06/05/2023   CHOL 190 06/05/2023   CHOL 193 06/05/2023   TRIG 89 06/05/2023   TRIG 92.0 06/05/2023   HDL 66 06/05/2023   HDL 59.70 06/05/2023    LDLDIRECT 108.0 06/05/2023   LDLCALC 106 (H) 06/05/2023   LDLCALC 115 (H) 06/05/2023   ALT 19 06/05/2023   AST 19 06/05/2023   NA 139 06/05/2023   K 4.3 06/05/2023   CL 103 06/05/2023   CREATININE 0.84 06/05/2023   BUN 16 06/05/2023   CO2 29 06/05/2023   TSH 2.05 06/05/2023   HGBA1C 5.7 06/05/2023    US Abdomen Limited RUQ (LIVER/GB)  Result Date: 01/07/2023 CLINICAL DATA:  Hemochromatosis. EXAM: ULTRASOUND ABDOMEN LIMITED RIGHT UPPER QUADRANT COMPARISON:  MRI abdomen 08/07/2022 FINDINGS: Gallbladder: No gallstones or wall thickening visualized. No sonographic Murphy sign noted by sonographer. Common bile duct: Diameter: 3.7 mm Liver: There is a 9 mm cyst within the right hepatic lobe. Additionally, the previously described echogenic lesion is similar measuring 1.5 x 0.7 x 1.7 cm, previously 2.2 x 1.9 x 0.8 cm. Increased hepatic parenchymal echogenicity. Portal vein is patent on color Doppler imaging with normal direction of blood flow towards the liver. Other: None. IMPRESSION: 1. No cholelithiasis or sonographic evidence for acute cholecystitis. 2. Previously described echogenic lesion within the right hepatic lobe is similar to slightly decreased in size. This likely represents a small hemangioma. Recommend six-month follow-up right upper quadrant ultrasound to ensure stability. 3. Increased hepatic parenchymal echogenicity suggestive of steatosis. Electronically Signed   By: Annia Belt M.D.   On: 01/07/2023 10:55    Assessment & Plan:  .Need for influenza vaccination -     Flu vaccine trivalent PF, 6mos and older(Flulaval,Afluria,Fluarix,Fluzone)  Vitamin D deficiency -     VITAMIN D 25 Hydroxy (Vit-D Deficiency, Fractures)  Steatosis of liver Assessment & Plan: Did not tolerate metformin due to persistent diarrhea.  Toletating crestor.    Orders: -     Comprehensive metabolic panel  Hyperlipidemia with target LDL less than 160 Assessment & Plan: Even though her cholesterol  is high,  Her 10 yr risk is 3%.  Continue RED YEAST RICE  Lab Results  Component Value Date   CHOL 190 06/05/2023   CHOL 193 06/05/2023   HDL 66 06/05/2023   HDL 59.70 06/05/2023   LDLCALC 106 (H) 06/05/2023   LDLCALC 115 (H) 06/05/2023   LDLDIRECT 108.0 06/05/2023   TRIG 89 06/05/2023   TRIG 92.0 06/05/2023   CHOLHDL 2.9 06/05/2023   CHOLHDL 3 06/05/2023     Orders: -     TSH -     Lipid Panel w/reflex Direct LDL -     Lipid panel -     LDL cholesterol, direct  Need for Tdap vaccination -     Tdap vaccine  greater than or equal to 7yo IM  Prediabetes -     Hemoglobin A1c  Generalized anxiety disorder Assessment & Plan: Improved with husband's improvement in anxiety.  She has recurrent symptoms which recur when medication is temporarily stopped.  Continue 10 mg citalopram both for mood and migraine prevention.   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 = resolved. .  Not phenotypic   Lab Results  Component Value Date   IRON 119 01/15/2023   TIBC 323 01/15/2023   FERRITIN 65 01/15/2023   Lab Results  Component Value Date   WBC 7.4 01/15/2023   HGB 13.4 01/15/2023   HCT 38.4 01/15/2023   MCV 88.1 01/15/2023   PLT 278 01/15/2023      Liver mass Assessment & Plan: Hemangioma suspected.  Stable by repeat u/s    Insomnia, unspecified type Assessment & Plan: Discussed natural remedies for insomnia including herbal tea and melatonin.  Reviewd principles of good sleep hygiene recommending tril fo relaxium      I provided 30 minutes of face-to-face time during this encounter reviewing patient's last visit with me, patient's  most recent visit with cardiology,  nephrology,  and neurology,  recent surgical and non surgical procedures, previous  labs and imaging studies, counseling on currently addressed issues,  and post visit ordering to diagnostics and therapeutics .    Follow-up: Return in about 6 months (around 12/04/2023) for physical.   Sherlene Shams, MD

## 2023-06-06 LAB — LIPID PANEL W/REFLEX DIRECT LDL
Cholesterol: 190 mg/dL (ref <200)
HDL: 66 mg/dL (ref >=50)
LDL Cholesterol (Calc): 106 mg/dL — ABNORMAL HIGH
Non-HDL Cholesterol (Calc): 124 mg/dL (ref <130)
Total CHOL/HDL Ratio: 2.9 (calc) (ref <5.0)
Triglycerides: 89 mg/dL (ref <150)

## 2023-06-07 DIAGNOSIS — G4700 Insomnia, unspecified: Secondary | ICD-10-CM | POA: Insufficient documentation

## 2023-06-07 NOTE — Assessment & Plan Note (Signed)
With iron saturation elevation. = resolved. .  Not phenotypic   Lab Results  Component Value Date   IRON 119 01/15/2023   TIBC 323 01/15/2023   FERRITIN 65 01/15/2023   Lab Results  Component Value Date   WBC 7.4 01/15/2023   HGB 13.4 01/15/2023   HCT 38.4 01/15/2023   MCV 88.1 01/15/2023   PLT 278 01/15/2023

## 2023-06-07 NOTE — Assessment & Plan Note (Signed)
Even though her cholesterol is high,  Her 10 yr risk is 3%.  Continue RED YEAST RICE  Lab Results  Component Value Date   CHOL 190 06/05/2023   CHOL 193 06/05/2023   HDL 66 06/05/2023   HDL 59.70 06/05/2023   LDLCALC 106 (H) 06/05/2023   LDLCALC 115 (H) 06/05/2023   LDLDIRECT 108.0 06/05/2023   TRIG 89 06/05/2023   TRIG 92.0 06/05/2023   CHOLHDL 2.9 06/05/2023   CHOLHDL 3 06/05/2023

## 2023-06-07 NOTE — Assessment & Plan Note (Signed)
Hemangioma suspected.  Stable by repeat u/s

## 2023-06-07 NOTE — Assessment & Plan Note (Signed)
Improved with husband's improvement in anxiety.  She has recurrent symptoms which recur when medication is temporarily stopped.  Continue 10 mg citalopram both for mood and migraine prevention.

## 2023-06-07 NOTE — Assessment & Plan Note (Signed)
Discussed natural remedies for insomnia including herbal tea and melatonin.  Reviewd principles of good sleep hygiene recommending tril fo relaxium

## 2023-06-27 ENCOUNTER — Other Ambulatory Visit: Payer: Self-pay | Admitting: Internal Medicine

## 2023-07-18 ENCOUNTER — Other Ambulatory Visit: Payer: BC Managed Care – PPO

## 2023-07-25 IMAGING — MG MM DIGITAL SCREENING BILAT W/ TOMO AND CAD
8 series · 8 of 24 positions shown · non-contrast
Comparison: Previous exam(s).

CLINICAL DATA: Screening.

EXAM:
DIGITAL SCREENING BILATERAL MAMMOGRAM WITH TOMOSYNTHESIS AND CAD
TECHNIQUE: Bilateral screening digital craniocaudal and mediolateral oblique
mammograms were obtained. Bilateral screening digital breast
tomosynthesis was performed. The images were evaluated with
computer-aided detection.

[L MLO synth-2D]
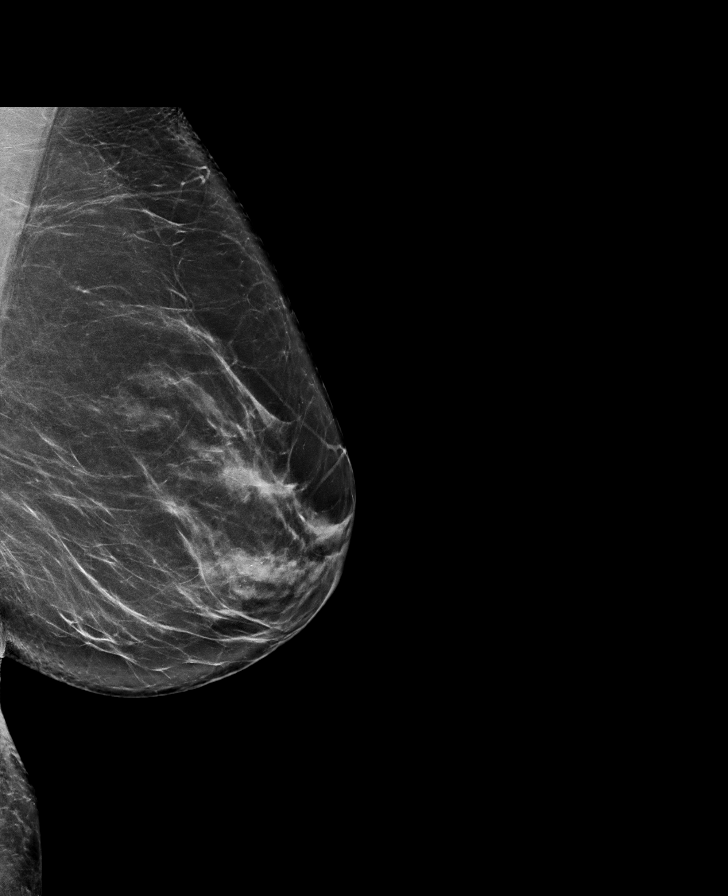

[L CC synth-2D]
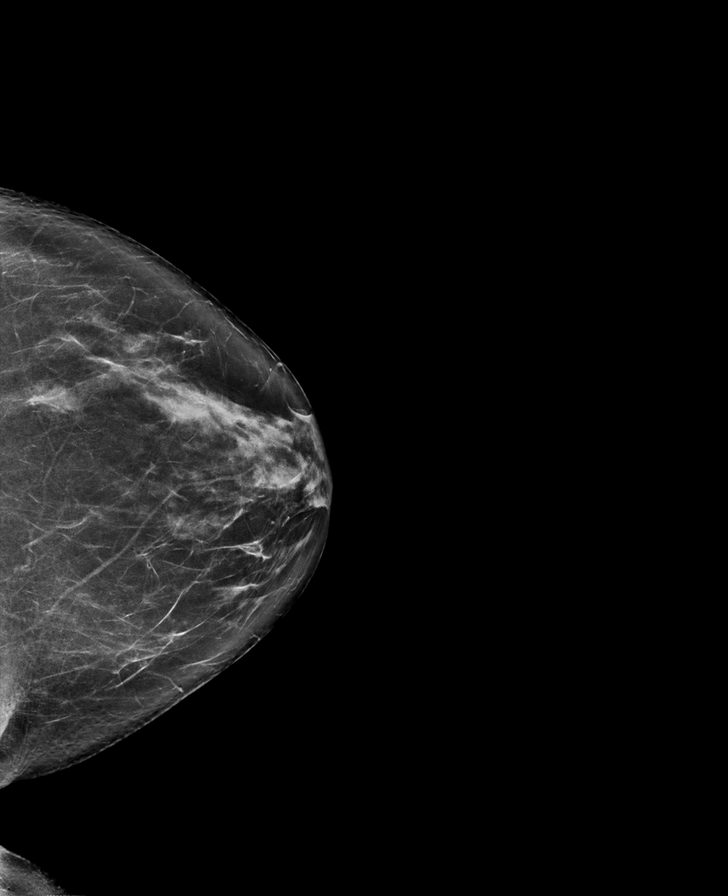

[R CC synth-2D]
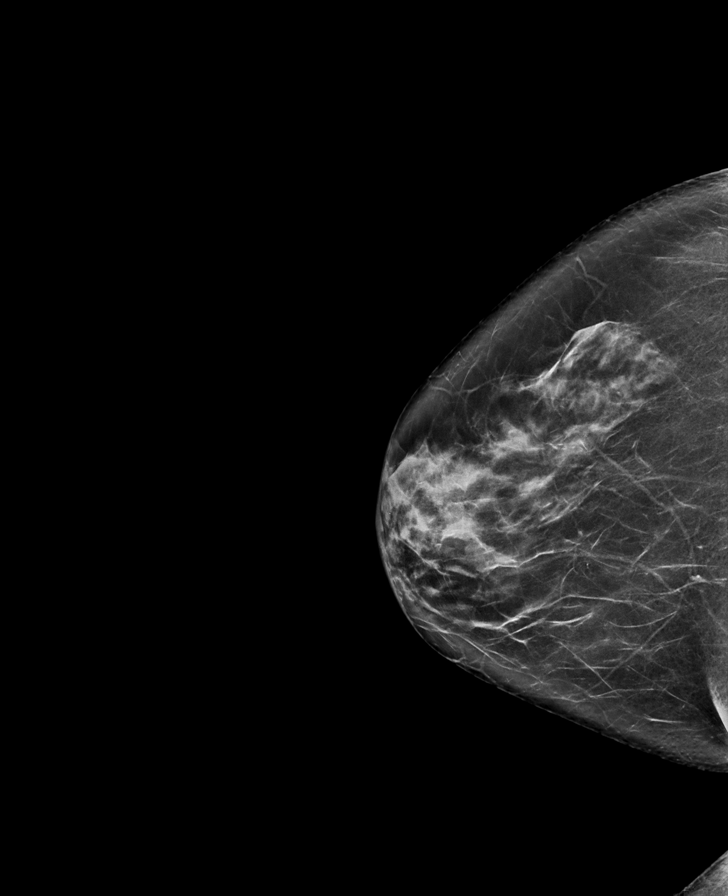

[R MLO synth-2D]
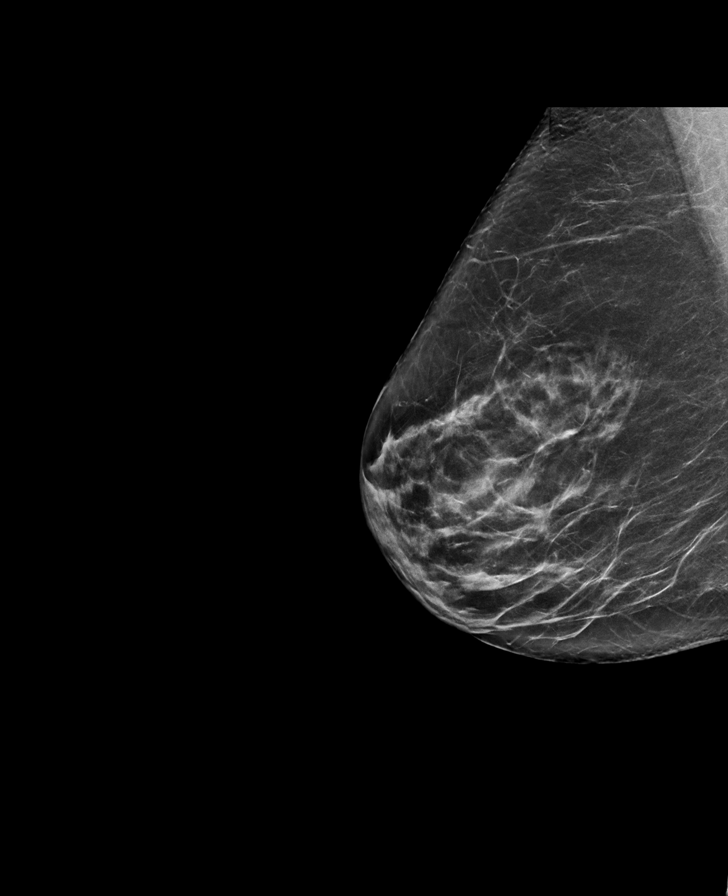

[R MLO tomo · tomo slice 37/74.0]
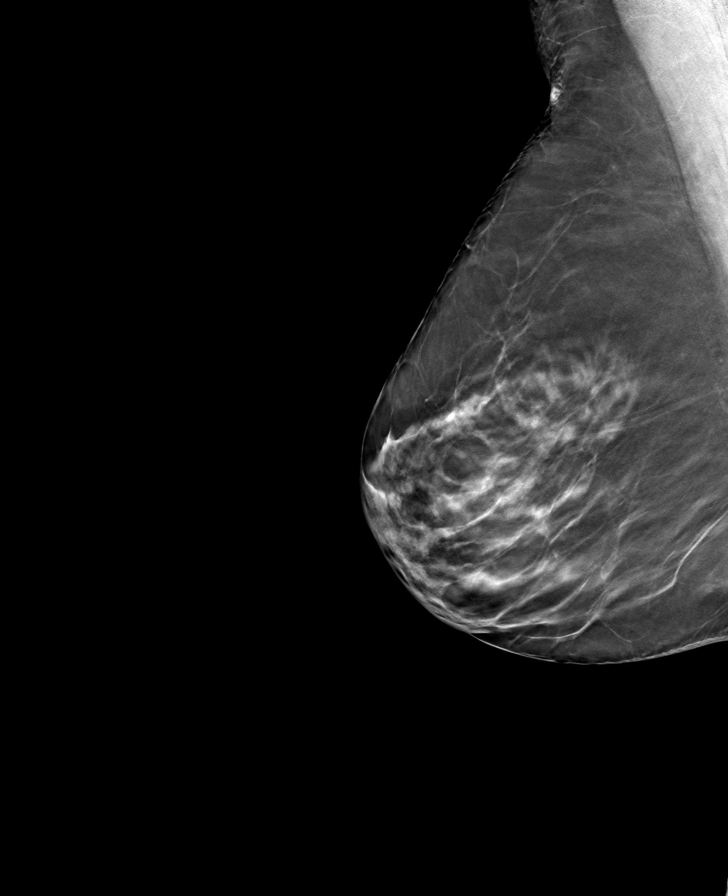

[R CC tomo · tomo slice 38/75.0]
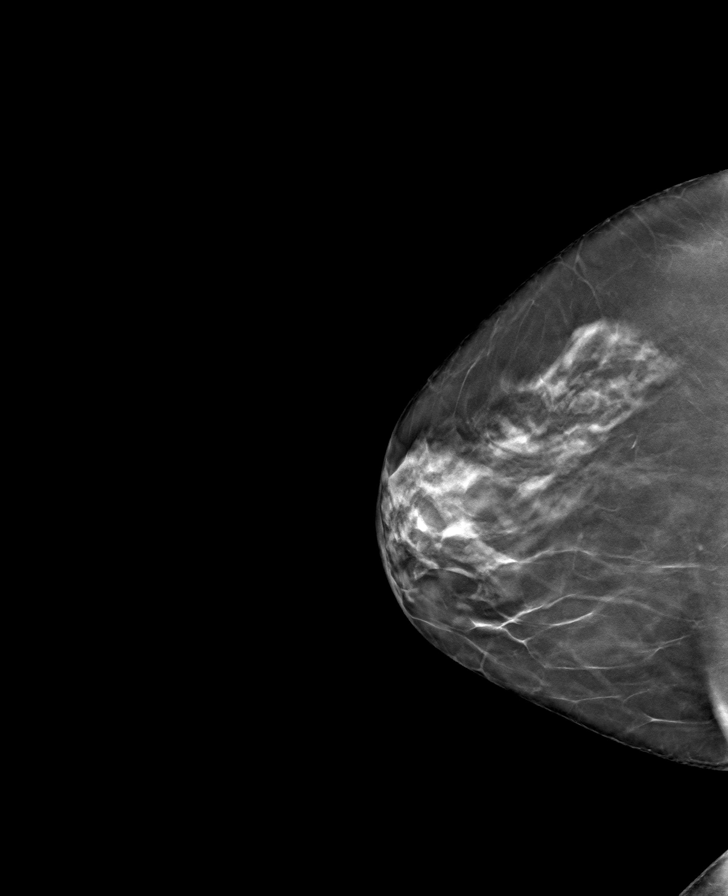

[L MLO tomo · tomo slice 40/79.0]
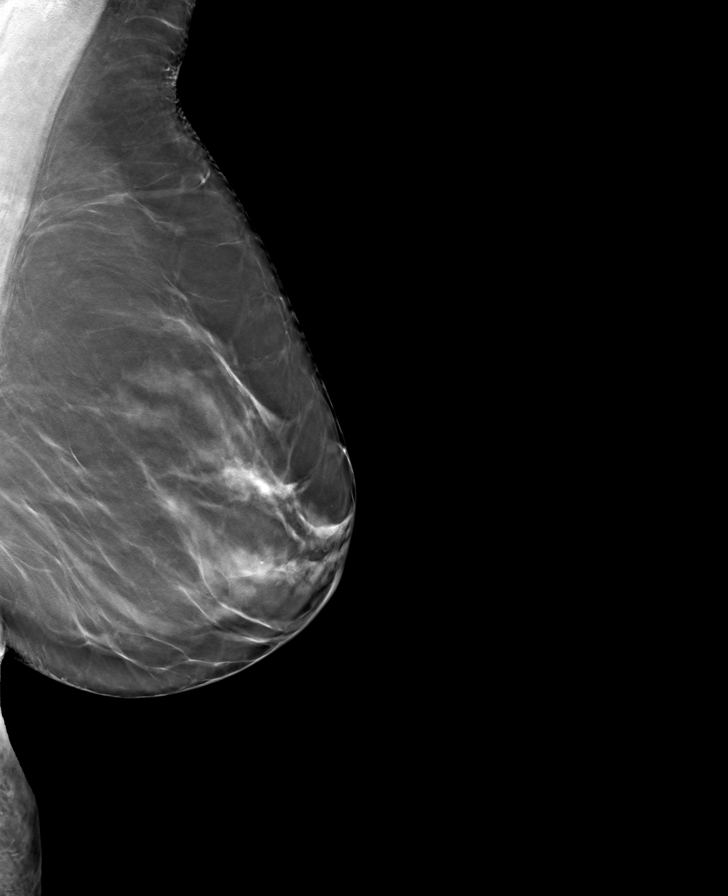

[L CC tomo · tomo slice 37/74.0]
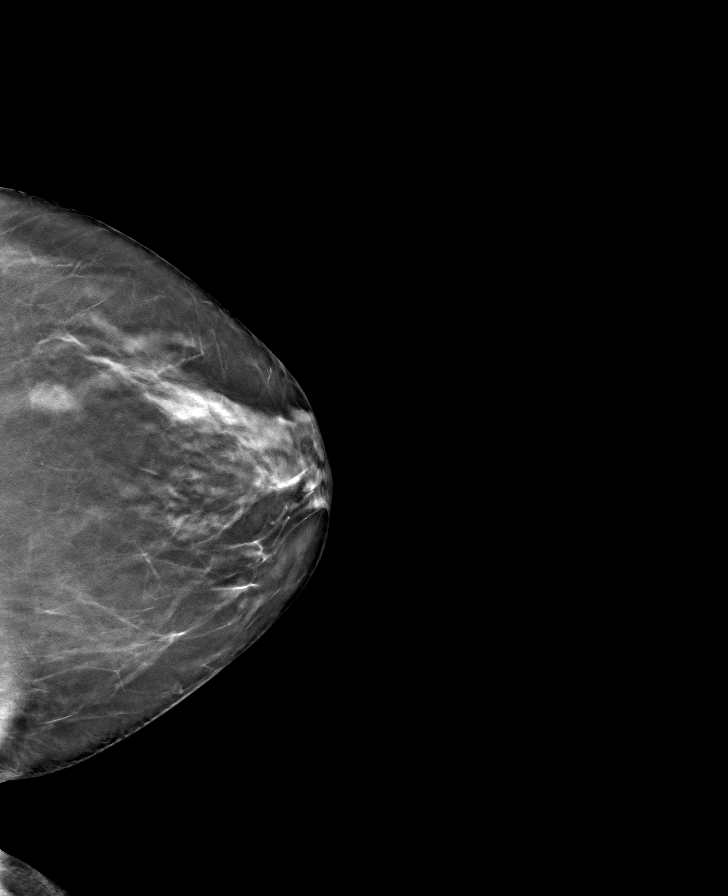

[8 of 24 positions shown; findings below may reference images not displayed]

ACR Breast Density Category c: The breast tissue is heterogeneously
dense, which may obscure small masses.
FINDINGS: There are no findings suspicious for malignancy.
IMPRESSION: No mammographic evidence of malignancy. A result letter of this
screening mammogram will be mailed directly to the patient.

RECOMMENDATION:
Screening mammogram in one year. (Code:Q3-W-BC3)

BI-RADS CATEGORY  1: Negative.

## 2023-08-18 ENCOUNTER — Other Ambulatory Visit: Payer: Self-pay

## 2023-08-20 ENCOUNTER — Inpatient Hospital Stay: Payer: 59 | Attending: Oncology

## 2023-08-20 ENCOUNTER — Other Ambulatory Visit: Payer: Self-pay

## 2023-08-20 DIAGNOSIS — D1809 Hemangioma of other sites: Secondary | ICD-10-CM | POA: Insufficient documentation

## 2023-08-20 DIAGNOSIS — K760 Fatty (change of) liver, not elsewhere classified: Secondary | ICD-10-CM

## 2023-08-20 LAB — CBC
HCT: 38.5 % (ref 36.0–46.0)
Hemoglobin: 13.5 g/dL (ref 12.0–15.0)
MCH: 31 pg (ref 26.0–34.0)
MCHC: 35.1 g/dL (ref 30.0–36.0)
MCV: 88.3 fL (ref 80.0–100.0)
Platelets: 301 10*3/uL (ref 150–400)
RBC: 4.36 MIL/uL (ref 3.87–5.11)
RDW: 11.9 % (ref 11.5–15.5)
WBC: 7.2 10*3/uL (ref 4.0–10.5)
nRBC: 0 % (ref 0.0–0.2)

## 2023-08-20 LAB — FERRITIN: Ferritin: 69 ng/mL (ref 11–307)

## 2023-08-20 LAB — IRON AND TIBC
Iron: 111 ug/dL (ref 28–170)
Saturation Ratios: 31 % (ref 10.4–31.8)
TIBC: 354 ug/dL (ref 250–450)
UIBC: 243 ug/dL

## 2023-08-21 ENCOUNTER — Encounter: Payer: Self-pay | Admitting: Internal Medicine

## 2023-08-26 MED ORDER — ESTRADIOL 0.1 MG/GM VA CREA: TOPICAL_CREAM | VAGINAL | 0 refills | Status: DC

## 2023-09-30 ENCOUNTER — Other Ambulatory Visit: Payer: Self-pay | Admitting: Internal Medicine

## 2023-09-30 ENCOUNTER — Other Ambulatory Visit (HOSPITAL_COMMUNITY): Payer: Self-pay

## 2023-09-30 ENCOUNTER — Telehealth: Payer: Self-pay

## 2023-09-30 MED ORDER — ATORVASTATIN CALCIUM 20 MG PO TABS: 20.0000 mg | ORAL_TABLET | Freq: Every day | ORAL | 3 refills | Status: DC

## 2023-09-30 NOTE — Telephone Encounter (Unsigned)
 Copied from CRM [id number]. Topic: Clinical - Medication Question >> Sep 30, 2023  4:58 PM Fuller Mandril wrote: Reason for CRM: Patient returned call regarding medication change. Read note as written and let patient know new prescription was sent to pharmacy. Patient has further questions about the change. Thank You

## 2023-09-30 NOTE — Telephone Encounter (Signed)
 [email or web address]

## 2023-09-30 NOTE — Telephone Encounter (Signed)
 Pharmacy Patient Advocate Encounter   Received notification from  [employer]  that prior authorization for Rosuvastatin is required/requested.   Insurance verification completed.   The patient is insured through [facility] [employer] .   Per test claim:  Atorvastatin is preferred by the insurance.  If suggested medication is appropriate, Please send in a new RX and discontinue this one. If not, please advise as to why it's not appropriate so that we may request a Prior Authorization. Please note, some preferred medications may still require a PA.  If the suggested medications have not been trialed and there are no contraindications to their use, the PA will not be submitted, as it will not be approved.

## 2023-10-01 NOTE — Telephone Encounter (Signed)
 LMTCB. Need to find out what further questions pt had about the change in medication.

## 2023-10-01 NOTE — Telephone Encounter (Signed)
 Spoke with patient & notified her & she will let us know if she has any issues with the new medication. She is aware that it was sent in yesterday.

## 2023-10-23 ENCOUNTER — Other Ambulatory Visit (HOSPITAL_COMMUNITY): Payer: Self-pay

## 2023-11-15 ENCOUNTER — Other Ambulatory Visit: Payer: Self-pay | Admitting: Internal Medicine

## 2023-12-16 ENCOUNTER — Encounter: Payer: BC Managed Care – PPO | Admitting: Internal Medicine

## 2024-01-07 ENCOUNTER — Encounter: Payer: Self-pay | Admitting: Internal Medicine

## 2024-01-07 ENCOUNTER — Ambulatory Visit (INDEPENDENT_AMBULATORY_CARE_PROVIDER_SITE_OTHER): Admitting: Internal Medicine

## 2024-01-07 ENCOUNTER — Other Ambulatory Visit (HOSPITAL_COMMUNITY)
Admission: RE | Admit: 2024-01-07 | Discharge: 2024-01-07 | Disposition: A | Discharge status: Home / Self Care | Source: Ambulatory Visit | Attending: Internal Medicine | Admitting: Internal Medicine

## 2024-01-07 VITALS — BP 112/64 | HR 74 | Ht 65.0 in | Wt 159.8 lb

## 2024-01-07 DIAGNOSIS — Z124 Encounter for screening for malignant neoplasm of cervix: Secondary | ICD-10-CM

## 2024-01-07 DIAGNOSIS — Z1231 Encounter for screening mammogram for malignant neoplasm of breast: Secondary | ICD-10-CM

## 2024-01-07 DIAGNOSIS — K760 Fatty (change of) liver, not elsewhere classified: Secondary | ICD-10-CM | POA: Diagnosis not present

## 2024-01-07 DIAGNOSIS — E785 Hyperlipidemia, unspecified: Secondary | ICD-10-CM | POA: Diagnosis not present

## 2024-01-07 DIAGNOSIS — R160 Hepatomegaly, not elsewhere classified: Secondary | ICD-10-CM

## 2024-01-07 DIAGNOSIS — Z1211 Encounter for screening for malignant neoplasm of colon: Secondary | ICD-10-CM | POA: Diagnosis not present

## 2024-01-07 DIAGNOSIS — G43109 Migraine with aura, not intractable, without status migrainosus: Secondary | ICD-10-CM | POA: Diagnosis not present

## 2024-01-07 DIAGNOSIS — Z0000 Encounter for general adult medical examination without abnormal findings: Secondary | ICD-10-CM

## 2024-01-07 DIAGNOSIS — F411 Generalized anxiety disorder: Secondary | ICD-10-CM

## 2024-01-07 MED ORDER — ESTRADIOL 0.1 MG/GM VA CREA: TOPICAL_CREAM | VAGINAL | 0 refills | Status: DC

## 2024-01-07 NOTE — Progress Notes (Unsigned)
 Patient ID: Kristine Mcguire, female    DOB: 29-Apr-1961  Age: 63 y.o. MRN: [id number]  The patient is here for annual preventive examination and management of other chronic and acute problems.   The risk factors are reflected in the social history.   The roster of all physicians providing medical care to patient - is listed in the Snapshot section of the chart.   Activities of daily living:  The patient is 100% independent in all ADLs: dressing, toileting, feeding as well as independent mobility   Home safety : The patient has smoke detectors in the home. They wear seatbelts.  There are no unsecured firearms at home. There is no violence in the home.    There is no risks for hepatitis, STDs or HIV. There is no   history of blood transfusion. They have no travel history to infectious disease endemic areas of the world.   The patient has seen their dentist in the last six month. They have seen their eye doctor in the last year. The patinet  denies slight hearing difficulty with regard to whispered voices and some television programs.  They have deferred audiologic testing in the last year.  They do not  have excessive sun exposure. Discussed the need for sun protection: hats, long sleeves and use of sunscreen if there is significant sun exposure.    Diet: the importance of a healthy diet is discussed. They do have a healthy diet.   The benefits of regular aerobic exercise were discussed. The patient  exercises  3 to 5 days per week  for  60 minutes.    Depression screen: there are no signs or vegative symptoms of depression- irritability, change in appetite, anhedonia, sadness/tearfullness.   The following portions of the patient's history were reviewed and updated as appropriate: allergies, current medications, past family history, past medical history,  past surgical history, past social history  and problem list.   Visual acuity was not assessed per patient preference since the patient has  regular follow up with an  ophthalmologist. Hearing and body mass index were assessed and reviewed.    During the course of the visit the patient was educated and counseled about appropriate screening and preventive services including : fall prevention , diabetes screening, nutrition counseling, colorectal cancer screening, and recommended immunizations.    Chief Complaint:  Mild itching occurring during activities  ,but random,  thinks it may be due to celexa  which she takes to prevent aura  Review of Symptoms  Patient denies headache, fevers, malaise, unintentional weight loss, skin rash, eye pain, sinus congestion and sinus pain, sore throat, dysphagia,  hemoptysis , cough, dyspnea, wheezing, chest pain, palpitations, orthopnea, edema, abdominal pain, nausea, melena, diarrhea, constipation, flank pain, dysuria, hematuria, urinary  Frequency, nocturia, numbness, tingling, seizures,  Focal weakness, Loss of consciousness,  Tremor, insomnia, depression, anxiety, and suicidal ideation.    Physical Exam:  BP 112/64   Pulse 74   Ht 5\' 5"  (1.651 m)   Wt 159 lb 12.8 oz (72.5 kg)   SpO2 96%   BMI 26.59 kg/m    Physical Exam  Assessment and Plan: Encounter for screening mammogram for malignant neoplasm of breast  Colon cancer screening  Hyperlipidemia with target LDL less than 160  Encounter for preventive health examination  Cervical cancer screening    No follow-ups on file.  Thersia Flax, MD

## 2024-01-07 NOTE — Patient Instructions (Signed)
 For your side effect of itching ,   consider a trial of generic Allegra , available generically as fexofenadine ;  180 mg once daily    You can also reduce celexa  to every other day   GI referral made to [facility]

## 2024-01-08 ENCOUNTER — Ambulatory Visit: Payer: Self-pay | Admitting: Internal Medicine

## 2024-01-08 LAB — LIPID PANEL
Cholesterol: 177 mg/dL (ref 0–200)
HDL: 50 mg/dL (ref >39.00)
LDL Cholesterol: 87 mg/dL (ref 0–99)
NonHDL: 126.86
Total CHOL/HDL Ratio: 4
Triglycerides: 200 mg/dL — ABNORMAL HIGH (ref 0.0–149.0)
VLDL: 40 mg/dL (ref 0.0–40.0)

## 2024-01-08 LAB — LDL CHOLESTEROL, DIRECT: Direct LDL: 101 mg/dL

## 2024-01-08 LAB — COMPREHENSIVE METABOLIC PANEL WITH GFR
ALT: 21 U/L (ref 0–35)
AST: 23 U/L (ref 0–37)
Albumin: 4.6 g/dL (ref 3.5–5.2)
Alkaline Phosphatase: 89 U/L (ref 39–117)
BUN: 19 mg/dL (ref 6–23)
CO2: 32 meq/L (ref 19–32)
Calcium: 9.6 mg/dL (ref 8.4–10.5)
Chloride: 102 meq/L (ref 96–112)
Creatinine, Ser: 0.93 mg/dL (ref 0.40–1.20)
GFR: 65.17 mL/min (ref >60.00)
Glucose, Bld: 76 mg/dL (ref 70–99)
Potassium: 4.2 meq/L (ref 3.5–5.1)
Sodium: 139 meq/L (ref 135–145)
Total Bilirubin: 0.3 mg/dL (ref 0.2–1.2)
Total Protein: 6.9 g/dL (ref 6.0–8.3)

## 2024-01-09 NOTE — Assessment & Plan Note (Signed)
 Did not tolerate metformin  due to persistent diarrhea.  Toletating statin. . Repeat ultrasound needed

## 2024-01-09 NOTE — Assessment & Plan Note (Signed)

## 2024-01-09 NOTE — Assessment & Plan Note (Signed)
 Improved.  Reducing celexa  to every other day

## 2024-01-09 NOTE — Assessment & Plan Note (Signed)
 SHE HAS ocular migraines and use citalopram  to prevent recurrence ,  Triggered by anxiety .  Reducing dosing to every other day

## 2024-01-09 NOTE — Assessment & Plan Note (Signed)
 6 month folllow up u/s was deferred until now. Ordered.

## 2024-01-09 NOTE — Assessment & Plan Note (Addendum)
 LDL is < 100 on atorvastatin  and LFTs are normal. No changes today  Lab Results  Component Value Date   CHOL 177 01/07/2024   HDL 50.00 01/07/2024   LDLCALC 87 01/07/2024   LDLDIRECT 101.0 01/07/2024   TRIG 200.0 (H) 01/07/2024   CHOLHDL 4 01/07/2024   Lab Results  Component Value Date   ALT 21 01/07/2024   AST 23 01/07/2024   ALKPHOS 89 01/07/2024   BILITOT 0.3 01/07/2024

## 2024-01-12 LAB — CYTOLOGY - PAP
Comment: NEGATIVE
Diagnosis: NEGATIVE
High risk HPV: NEGATIVE

## 2024-01-14 ENCOUNTER — Ambulatory Visit: Payer: BC Managed Care – PPO | Admitting: Dermatology

## 2024-01-15 ENCOUNTER — Ambulatory Visit: Admitting: Dermatology

## 2024-01-15 ENCOUNTER — Encounter: Payer: Self-pay | Admitting: Dermatology

## 2024-01-15 ENCOUNTER — Other Ambulatory Visit: Payer: Self-pay

## 2024-01-15 DIAGNOSIS — L578 Other skin changes due to chronic exposure to nonionizing radiation: Secondary | ICD-10-CM | POA: Diagnosis not present

## 2024-01-15 DIAGNOSIS — Z86018 Personal history of other benign neoplasm: Secondary | ICD-10-CM

## 2024-01-15 DIAGNOSIS — Z1283 Encounter for screening for malignant neoplasm of skin: Secondary | ICD-10-CM

## 2024-01-15 DIAGNOSIS — D1801 Hemangioma of skin and subcutaneous tissue: Secondary | ICD-10-CM

## 2024-01-15 DIAGNOSIS — D492 Neoplasm of unspecified behavior of bone, soft tissue, and skin: Secondary | ICD-10-CM | POA: Diagnosis not present

## 2024-01-15 DIAGNOSIS — W908XXA Exposure to other nonionizing radiation, initial encounter: Secondary | ICD-10-CM | POA: Diagnosis not present

## 2024-01-15 DIAGNOSIS — L821 Other seborrheic keratosis: Secondary | ICD-10-CM

## 2024-01-15 DIAGNOSIS — L858 Other specified epidermal thickening: Secondary | ICD-10-CM

## 2024-01-15 DIAGNOSIS — D229 Melanocytic nevi, unspecified: Secondary | ICD-10-CM

## 2024-01-15 DIAGNOSIS — L814 Other melanin hyperpigmentation: Secondary | ICD-10-CM

## 2024-01-15 NOTE — Patient Instructions (Addendum)

## 2024-01-15 NOTE — Progress Notes (Signed)
 Follow-Up Visit   Subjective  Kristine Mcguire is a 63 y.o. female who presents for the following: Yearly Skin Cancer Screening and Full Body Skin Exam, hx of Dysplastic nevus.   The patient presents for Total-Body Skin Exam (TBSE) for skin cancer screening and mole check. The patient has spots, moles and lesions to be evaluated, some may be new or changing and the patient may have concern these could be cancer.  The following portions of the chart were reviewed this encounter and updated as appropriate: medications, allergies, medical history  Review of Systems:  No other skin or systemic complaints except as noted in HPI or Assessment and Plan.  Objective  Well appearing patient in no apparent distress; mood and affect are within normal limits.  A full examination was performed including scalp, head, eyes, ears, nose, lips, neck, chest, axillae, abdomen, back, buttocks, bilateral upper extremities, bilateral lower extremities, hands, feet, fingers, toes, fingernails, and toenails. All findings within normal limits unless otherwise noted below.   Relevant physical exam findings are noted in the Assessment and Plan.         left foot middle medial sole 0.6 cm brown macule   Assessment & Plan   SKIN CANCER SCREENING PERFORMED TODAY.  ACTINIC DAMAGE - Chronic condition, secondary to cumulative UV/sun exposure - diffuse scaly erythematous macules with underlying dyspigmentation - Recommend daily broad spectrum sunscreen SPF 30+ to sun-exposed areas, reapply every 2 hours as needed.  - Staying in the shade or wearing long sleeves, sun glasses (UVA+UVB protection) and wide brim hats (4-inch brim around the entire circumference of the hat) are also recommended for sun protection.  - Call for new or changing lesions.  LENTIGINES, SEBORRHEIC KERATOSES, HEMANGIOMAS - Benign normal skin lesions - Benign-appearing - Call for any changes  MELANOCYTIC NEVI - Tan-brown and/or  pink-flesh-colored symmetric macules and papules - Benign appearing on exam today - Observation - Call clinic for new or changing moles - Recommend daily use of broad spectrum spf 30+ sunscreen to sun-exposed areas.   HISTORY OF DYSPLASTIC NEVUS No evidence of recurrence today Recommend regular full body skin exams Recommend daily broad spectrum sunscreen SPF 30+ to sun-exposed areas, reapply every 2 hours as needed.  Call if any new or changing lesions are noted between office visits   NEOPLASM OF SKIN left foot middle medial sole Epidermal / dermal shaving  Lesion diameter (cm):  0.6 Informed consent: discussed and consent obtained   Timeout: patient name, date of birth, surgical site, and procedure verified   Procedure prep:  Patient was prepped and draped in usual sterile fashion Prep type:  Isopropyl alcohol Anesthesia: the lesion was anesthetized in a standard fashion   Anesthetic:  1% lidocaine  w/ epinephrine  1-100,000 buffered w/ 8.4% NaHCO3 Hemostasis achieved with: pressure, aluminum chloride and electrodesiccation   Outcome: patient tolerated procedure well   Post-procedure details: sterile dressing applied and wound care instructions given   Dressing type: bandage and petrolatum   Specimen 1 - Surgical pathology Differential Diagnosis: R/O Dysplastic nevus  2 pieces in container  Check Margins: No HISTORY OF DYSPLASTIC NEVUS   SKIN CANCER SCREENING   ACTINIC SKIN DAMAGE   LENTIGO   MELANOCYTIC NEVUS, UNSPECIFIED LOCATION   SEBORRHEIC KERATOSIS     Return in about 1 year (around 01/14/2025) for TBSE, hx of Dysplastic nevus .  Jacob Master, CMA, am acting as scribe for Celine Collard, MD .   Documentation: I have reviewed the above documentation for  accuracy and completeness, and I agree with the above.  Celine Collard, MD

## 2024-01-16 ENCOUNTER — Inpatient Hospital Stay: Payer: BC Managed Care – PPO | Attending: Oncology

## 2024-01-16 ENCOUNTER — Inpatient Hospital Stay: Payer: BC Managed Care – PPO | Admitting: Oncology

## 2024-01-16 ENCOUNTER — Encounter: Payer: Self-pay | Admitting: Oncology

## 2024-01-16 DIAGNOSIS — Z79899 Other long term (current) drug therapy: Secondary | ICD-10-CM | POA: Diagnosis not present

## 2024-01-16 DIAGNOSIS — Z148 Genetic carrier of other disease: Secondary | ICD-10-CM | POA: Diagnosis not present

## 2024-01-16 LAB — CMP (CANCER CENTER ONLY)
ALT: 23 U/L (ref 0–44)
AST: 25 U/L (ref 15–41)
Albumin: 4.3 g/dL (ref 3.5–5.0)
Alkaline Phosphatase: 97 U/L (ref 38–126)
Anion gap: 6 (ref 5–15)
BUN: 16 mg/dL (ref 8–23)
CO2: 24 mmol/L (ref 22–32)
Calcium: 8.9 mg/dL (ref 8.9–10.3)
Chloride: 108 mmol/L (ref 98–111)
Creatinine: 0.86 mg/dL (ref 0.44–1.00)
GFR, Estimated: >60 mL/min (ref >60)
Glucose, Bld: 100 mg/dL — ABNORMAL HIGH (ref 70–99)
Potassium: 4.1 mmol/L (ref 3.5–5.1)
Sodium: 138 mmol/L (ref 135–145)
Total Bilirubin: 0.7 mg/dL (ref 0.0–1.2)
Total Protein: 6.8 g/dL (ref 6.5–8.1)

## 2024-01-16 LAB — CBC (CANCER CENTER ONLY)
HCT: 40.9 % (ref 36.0–46.0)
Hemoglobin: 13.7 g/dL (ref 12.0–15.0)
MCH: 30.2 pg (ref 26.0–34.0)
MCHC: 33.5 g/dL (ref 30.0–36.0)
MCV: 90.3 fL (ref 80.0–100.0)
Platelet Count: 295 10*3/uL (ref 150–400)
RBC: 4.53 MIL/uL (ref 3.87–5.11)
RDW: 12.3 % (ref 11.5–15.5)
WBC Count: 5.7 10*3/uL (ref 4.0–10.5)
nRBC: 0 % (ref 0.0–0.2)

## 2024-01-16 LAB — IRON AND TIBC
Iron: 128 ug/dL (ref 28–170)
Saturation Ratios: 37 % — ABNORMAL HIGH (ref 10.4–31.8)
TIBC: 343 ug/dL (ref 250–450)
UIBC: 215 ug/dL

## 2024-01-16 LAB — FERRITIN: Ferritin: 51 ng/mL (ref 11–307)

## 2024-01-16 NOTE — Progress Notes (Signed)
 Patient presents today doing well and has no new issues or concerns.

## 2024-01-17 NOTE — Progress Notes (Signed)
 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 She had undergone a CT abdomen and pelvis with contrast in November 2023 which showed fatty infiltration of the liver and low-density lesion in the inferior right hepatic lobe measuring 7 mm. At that time there was also signs of acute appendicitis.. She then had a right upper quadrant ultrasound which showed a 2.2 cm echogenic mass in the right hepatic lobe which could be a hemangioma but was otherwise indeterminate. This was followed by an MRI liver with and without contrast which was motion degraded but showed diffuse hepatic steatosis and no suspicious hepatic lesion was found. AFP normal at 2.8. GGT mildly elevated at 56. Liver enzymes normal. Ferritin was normal at 127 with an iron saturation of 62%.   Interval history-patient is feeling well presently.  Denies any symptoms of nausea vomiting diarrhea or abdominal pain  ECOG PS- 0 Pain scale- 0   Review of systems- Review of Systems  Constitutional:  Negative for chills, fever, malaise/fatigue and weight loss.  HENT:  Negative for congestion, ear discharge and nosebleeds.   Eyes:  Negative for blurred vision.  Respiratory:  Negative for cough, hemoptysis, sputum production, shortness of breath and wheezing.   Cardiovascular:  Negative  for chest pain, palpitations, orthopnea and claudication.  Gastrointestinal:  Negative for abdominal pain, blood in stool, constipation, diarrhea, heartburn, melena, nausea and vomiting.  Genitourinary:  Negative for dysuria, flank pain, frequency, hematuria and urgency.  Musculoskeletal:  Negative for back pain, joint pain and myalgias.  Skin:  Negative for rash.  Neurological:  Negative for dizziness, tingling, focal weakness, seizures, weakness and headaches.  Endo/Heme/Allergies:  Does not bruise/bleed easily.  Psychiatric/Behavioral:  Negative for depression and suicidal ideas. The patient does not have insomnia.       No Known Allergies   Past Medical History:  Diagnosis Date   Appendicitis 06/2022   Atypical mole 03/08/2014   L distal ant lat thigh, excised 04/26/2014   Hepatic steatosis    History of chicken pox    Hx of dysplastic nevus    R tricep, txted in past per patient   Hx of dysplastic nevus 03/08/2014   R lower back, moderate atypia   Hx of dysplastic nevus 01/08/2023   L medial heel, moderate atypia   Migraine    UTI (urinary tract infection)    Vitamin D  deficiency      Past Surgical History:  Procedure Laterality Date   APPENDECTOMY  01/24   CESAREAN SECTION  11/05/1990   COLONOSCOPY  2012   COLONOSCOPY WITH PROPOFOL  N/A 10/30/2020   Procedure: COLONOSCOPY WITH PROPOFOL ;  Surgeon: Luke Salaam, MD;  Location: [facility] ENDOSCOPY;  Service: Gastroenterology;  Laterality: N/A;   DEBRIDEMENT TENNIS ELBOW Right 12/2006   Right elbow   LAPAROSCOPIC APPENDECTOMY N/A 08/29/2022   Procedure: APPENDECTOMY LAPAROSCOPIC;  Surgeon: Alben Alma, MD;  Location: [facility] ORS;  Service: General;  Laterality: N/A;  LIVER BIOPSY N/A 08/29/2022   Procedure: LIVER BIOPSY;  Surgeon: Alben Alma, MD;  Location: [facility] ORS;  Service: General;  Laterality: N/A;    Social History   Socioeconomic History   Marital status: Married    Spouse name: Emeterio Hansen   Number of  children: 2   Years of education: Not on file   Highest education level: Bachelor's degree (e.g., BA, AB, BS)  Occupational History   Not on file  Tobacco Use   Smoking status: Never    Passive exposure: Never   Smokeless tobacco: Never  Vaping Use   Vaping status: Never Used  Substance and Sexual Activity   Alcohol use: Yes    Alcohol/week: 1.0 standard drink of alcohol    Types: 1 Glasses of wine per week    Comment: seldom   Drug use: No   Sexual activity: Yes  Other Topics Concern   Not on file  Social History Narrative   Not on file   Social Drivers of Health   Financial Resource Strain: Low Risk  (01/03/2024)   Overall Financial Resource Strain (CARDIA)    Difficulty of Paying Living Expenses: Not hard at all  Food Insecurity: No Food Insecurity (01/03/2024)   Hunger Vital Sign    Worried About Running Out of Food in the Last Year: Never true    Ran Out of Food in the Last Year: Never true  Transportation Needs: No Transportation Needs (01/03/2024)   PRAPARE - Administrator, Civil Service (Medical): No    Lack of Transportation (Non-Medical): No  Physical Activity: Sufficiently Active (01/03/2024)   Exercise Vital Sign    Days of Exercise per Week: 5 days    Minutes of Exercise per Session: 30 min  Stress: No Stress Concern Present (01/03/2024)   [employer] of Occupational Health - Occupational Stress Questionnaire    Feeling of Stress : Not at all  Social Connections: Socially Integrated (01/03/2024)   Social Connection and Isolation Panel [NHANES]    Frequency of Communication with Friends and Family: More than three times a week    Frequency of Social Gatherings with Friends and Family: Three times a week    Attends Religious Services: More than 4 times per year    Active Member of Clubs or Organizations: Yes    Attends Banker Meetings: More than 4 times per year    Marital Status: Married  Catering manager Violence: Not At Risk  (07/09/2022)   Humiliation, Afraid, Rape, and Kick questionnaire    Fear of Current or Ex-Partner: No    Emotionally Abused: No    Physically Abused: No    Sexually Abused: No    Family History  Problem Relation Age of Onset   Hyperlipidemia Mother    Hypertension Mother 57       Runs in mother's side of family   Cancer Father        bone   Heart attack Maternal Uncle 46   Heart attack Maternal Grandmother 76   Pulmonary fibrosis Maternal Grandmother    Heart attack Maternal Grandfather 35   Alzheimer's disease Paternal Grandmother    Pulmonary fibrosis Brother 35       idiopathic    Pulmonary fibrosis Paternal Uncle    Breast cancer Maternal Aunt        postmeno.     Current Outpatient Medications:    atorvastatin  (LIPITOR) 20 MG tablet, Take 1 tablet (20 mg total) by mouth daily.,  Disp: 90 tablet, Rfl: 3   Cholecalciferol (VITAMIN D3) 1000 UNITS CHEW, Chew 2 tablets by mouth daily. , Disp: , Rfl:    citalopram  (CELEXA ) 10 MG tablet, TAKE 1 TABLET BY MOUTH EVERY DAY, Disp: 90 tablet, Rfl: 3   estradiol  (ESTRACE  VAGINAL) 0.1 MG/GM vaginal cream, 1 gram intravagainally at night for 2 weeks,  then twice weekly thereafter, Disp: 42.5 g, Rfl: 0  Physical exam:  Vitals:   01/16/24 1106  BP: 105/64  Pulse: 70  Resp: 18  Temp: 97.8 F (36.6 C)  TempSrc: Tympanic  SpO2: 97%  Weight: 159 lb 6.4 oz (72.3 kg)  Height: 5\' 5"  (1.651 m)   Physical Exam Cardiovascular:     Rate and Rhythm: Normal rate and regular rhythm.     Heart sounds: Normal heart sounds.  Pulmonary:     Effort: Pulmonary effort is normal.     Breath sounds: Normal breath sounds.  Abdominal:     General: Bowel sounds are normal.     Palpations: Abdomen is soft.  Skin:    General: Skin is warm and dry.  Neurological:     Mental Status: She is alert and oriented to person, place, and time.      I have personally reviewed labs listed below:    Latest Ref Rng & Units 01/16/2024   10:50 AM  CMP   Glucose 70 - 99 mg/dL 161   BUN 8 - 23 mg/dL 16   Creatinine 0.96 - 1.00 mg/dL 0.45   Sodium 409 - 811 mmol/L 138   Potassium 3.5 - 5.1 mmol/L 4.1   Chloride 98 - 111 mmol/L 108   CO2 22 - 32 mmol/L 24   Calcium  8.9 - 10.3 mg/dL 8.9   Total Protein 6.5 - 8.1 g/dL 6.8   Total Bilirubin 0.0 - 1.2 mg/dL 0.7   Alkaline Phos 38 - 126 U/L 97   AST 15 - 41 U/L 25   ALT 0 - 44 U/L 23       Latest Ref Rng & Units 01/16/2024   10:50 AM  CBC  WBC 4.0 - 10.5 K/uL 5.7   Hemoglobin 12.0 - 15.0 g/dL 91.4   Hematocrit 78.2 - 46.0 % 40.9   Platelets 150 - 400 K/uL 295      Assessment and plan- Patient is a 63 y.o. 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  Her CBC and CMP are unremarkable.  Iron studies show an iron saturation of 37% which is remained stable as compared to her prior values and ferritin levels are normal at 51.  She does not require any phlebotomy at this time.  Patient had a right upper quadrant ultrasound in May 2024 which showed a echogenic lesion in the right hepatic lobe which had decreased in size and likely representing a small hemangioma.  She has a repeat follow-up ultrasound scheduled next week.  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 Seretha Dance, MD, MPH [facility] [phone] 01/17/2024 12:08 PM

## 2024-01-21 ENCOUNTER — Ambulatory Visit: Payer: Self-pay | Admitting: Dermatology

## 2024-01-21 LAB — SURGICAL PATHOLOGY

## 2024-01-21 NOTE — Telephone Encounter (Signed)
-----   Message from Celine Collard sent at 01/21/2024  4:35 PM EDT ----- FINAL DIAGNOSIS        1. 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   No abnormal cells = Benign Likely represents a previous injury with thickened skin like a discolored callous No further treatment needed

## 2024-01-21 NOTE — Telephone Encounter (Signed)
 Patient informed of pathology results

## 2024-01-22 ENCOUNTER — Ambulatory Visit
Admission: RE | Admit: 2024-01-22 | Discharge: 2024-01-22 | Disposition: A | Discharge status: Home / Self Care | Source: Ambulatory Visit | Attending: Internal Medicine | Admitting: Internal Medicine

## 2024-01-22 ENCOUNTER — Encounter: Payer: Self-pay | Admitting: Internal Medicine

## 2024-01-22 DIAGNOSIS — R160 Hepatomegaly, not elsewhere classified: Secondary | ICD-10-CM | POA: Diagnosis present

## 2024-01-22 DIAGNOSIS — K760 Fatty (change of) liver, not elsewhere classified: Secondary | ICD-10-CM | POA: Diagnosis present

## 2024-02-03 ENCOUNTER — Encounter: Payer: Self-pay | Admitting: Internal Medicine

## 2024-02-09 ENCOUNTER — Ambulatory Visit: Admission: RE | Admit: 2024-02-09 | Source: Home / Self Care | Admitting: Gastroenterology

## 2024-02-09 SURGERY — COLONOSCOPY
Anesthesia: General

## 2024-02-10 ENCOUNTER — Ambulatory Visit
Admission: RE | Admit: 2024-02-10 | Discharge: 2024-02-10 | Disposition: A | Discharge status: Home / Self Care | Source: Ambulatory Visit | Attending: Internal Medicine | Admitting: Internal Medicine

## 2024-02-10 DIAGNOSIS — Z1231 Encounter for screening mammogram for malignant neoplasm of breast: Secondary | ICD-10-CM | POA: Insufficient documentation

## 2024-06-28 ENCOUNTER — Ambulatory Visit: Admitting: Physical Therapy

## 2024-06-29 ENCOUNTER — Ambulatory Visit: Attending: Orthopedic Surgery

## 2024-06-29 ENCOUNTER — Other Ambulatory Visit: Payer: Self-pay

## 2024-06-29 DIAGNOSIS — M6281 Muscle weakness (generalized): Secondary | ICD-10-CM | POA: Diagnosis present

## 2024-06-29 DIAGNOSIS — M5459 Other low back pain: Secondary | ICD-10-CM | POA: Insufficient documentation

## 2024-06-29 DIAGNOSIS — M25552 Pain in left hip: Secondary | ICD-10-CM | POA: Diagnosis present

## 2024-06-29 NOTE — Therapy (Signed)
 OUTPATIENT PHYSICAL THERAPY THORACOLUMBAR EVALUATION   Patient Name: Kristine Mcguire MRN: [id number] DOB:March 24, 1961, 63 y.o., female Today's Date: 06/30/2024  END OF SESSION:  PT End of Session - 06/29/24 1306     Visit Number 1    Number of Visits 3    Date for Recertification  08/24/24    Authorization Type [employer]    PT Start Time 1145    PT Stop Time 1220    PT Time Calculation (min) 35 min    Activity Tolerance Patient tolerated treatment well    Behavior During Therapy [facility] for tasks assessed/performed          Past Medical History:  Diagnosis Date   Appendicitis 06/2022   Atypical mole 03/08/2014   L distal ant lat thigh, excised 04/26/2014   Hepatic steatosis    History of chicken pox    Hx of dysplastic nevus    R tricep, txted in past per patient   Hx of dysplastic nevus 03/08/2014   R lower back, moderate atypia   Hx of dysplastic nevus 01/08/2023   L medial heel, moderate atypia   Migraine    UTI (urinary tract infection)    Vitamin D  deficiency    Past Surgical History:  Procedure Laterality Date   APPENDECTOMY  01/24   CESAREAN SECTION  11/05/1990   COLONOSCOPY  2012   COLONOSCOPY WITH PROPOFOL  N/A 10/30/2020   Procedure: COLONOSCOPY WITH PROPOFOL ;  Surgeon: Therisa Bi, MD;  Location[facility] ENDOSCOPY;  Service: Gastroenterology;  Laterality: N/A;   DEBRIDEMENT TENNIS ELBOW Right 12/2006   Right elbow   LAPAROSCOPIC APPENDECTOMY N/A 08/29/2022   Procedure: APPENDECTOMY LAPAROSCOPIC;  Surgeon: Jordis Laneta FALCON, MD;  Location: [facility] ORS;  Service: General;  Laterality: N/A;   LIVER BIOPSY N/A 08/29/2022   Procedure: LIVER BIOPSY;  Surgeon: Jordis Laneta FALCON, MD;  Location: [facility] ORS;  Service: General;  Laterality: N/A;   Patient Active Problem List   Diagnosis Date Noted   Insomnia 06/07/2023   Tinea corporis 12/05/2022   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 08/08/2022   Steatosis of liver 07/29/2022    Liver mass 07/23/2022   Tubular adenoma of colon 12/01/2020   Generalized anxiety disorder 01/30/2014   Migraine 01/09/2014   Vitamin D  deficiency 10/17/2013   Encounter for preventive health examination 04/15/2013   Hyperlipidemia with target LDL less than 160 04/15/2013    PCP: Marylynn Verneita CROME, MD   REFERRING PROVIDER: Josefina Chew, MD  REFERRING DIAG: M54.50 (ICD-10-CM) - Low back pain   Rationale for Evaluation and Treatment: Rehabilitation  THERAPY DIAG:  Pain in left hip  Other low back pain  Muscle weakness (generalized)  ONSET DATE: Chronic  SUBJECTIVE:  SUBJECTIVE STATEMENT: Pt presents to PT with reports of L sided back pain with referral of burning down L LE to L ankle. Pain only radiates when she sits in a particular position on sofa at night. Denies bowel/bladder changes or saddle anesthesia. Has had PT in past for similar issues with good success.   PERTINENT HISTORY:  See PMH  PAIN:  Are you having pain?  Yes: NPRS scale: 1/10 Worst: 4/10 Pain location: low back, L posterior hip, L LE down to lateral ankle Pain description: ache, burning  Aggravating factors: sitting on sofa at home Relieving factors: medication,   PRECAUTIONS: None  RED FLAGS: None   WEIGHT BEARING RESTRICTIONS: No  FALLS:  Has patient fallen in last 6 months? No  LIVING ENVIRONMENT: Lives with: lives with their family Lives in: House/apartment  OCCUPATION: Works at [employer]  PLOF: Independent  PATIENT GOALS: decrease low back and L hip pain   NEXT MD VISIT: PRN  OBJECTIVE:  Note: Objective measures were completed at Evaluation unless otherwise noted.  DIAGNOSTIC FINDINGS:  See imaging   PATIENT SURVEYS:  Modified Oswestry:  MODIFIED OSWESTRY DISABILITY SCALE  Date:  06/29/2024 Score  Pain intensity 0 = I can tolerate the pain I have without having to use pain medication.  2. Personal care (washing, dressing, etc.) 0 =  I can take care of myself normally without causing increased pain.  3. Lifting 2 = Pain prevents me from lifting heavy weights off the floor,but I can manage if the weights are conveniently positioned (e.g. on a table)  4. Walking 0 = Pain does not prevent me from walking any distance  5. Sitting 1 =  I can only sit in my favorite chair as long as I like.  6. Standing 1 =  I can stand as long as I want but, it increases my pain.  7. Sleeping 1 = I can sleep well only by using pain medication.  8. Social Life 0 = My social life is normal and does not increase my pain.  9. Traveling 1 =  I can travel anywhere, but it increases my pain.  10. Employment/ Homemaking 0 = My normal homemaking/job activities do not cause pain.  Total 6/50   Interpretation of scores: Score Category Description  0-20% Minimal Disability The patient can cope with most living activities. Usually no treatment is indicated apart from advice on lifting, sitting and exercise  21-40% Moderate Disability The patient experiences more pain and difficulty with sitting, lifting and standing. Travel and social life are more difficult and they may be disabled from work. Personal care, sexual activity and sleeping are not grossly affected, and the patient can usually be managed by conservative means  41-60% Severe Disability Pain remains the main problem in this group, but activities of daily living are affected. These patients require a detailed investigation  61-80% Crippled Back pain impinges on all aspects of the patient's life. Positive intervention is required  81-100% Bed-bound These patients are either bed-bound or exaggerating their symptoms  Bluford FORBES Zoe DELENA Karon DELENA, et al. Surgery versus conservative management of stable thoracolumbar fracture: the PRESTO feasibility  RCT. [place] ([place]): [employer]; 2021 Nov. [facility] Technology Assessment, No. 25.62.) Appendix 3, Oswestry Disability Index category descriptors. Available from: Findjewelers.cz  Minimally Clinically Important Difference (MCID) = 12.8%  COGNITION: Overall cognitive status: Within functional limits for tasks assessed     SENSATION: WFL  POSTURE: rounded shoulders, forward head, and increased lumbar lordosis  PALPATION: TTP to L lateral hip/piriformis   LUMBAR ROM:   AROM eval  Flexion WNL  Extension WNL  Right lateral flexion   Left lateral flexion   Right rotation   Left rotation    (Blank rows = not tested)  LOWER EXTREMITY MMT:    MMT Right eval Left eval  Hip flexion 5 5  Hip extension    Hip abduction 5 4  Hip adduction    Hip internal rotation    Hip external rotation    Knee flexion 5 5  Knee extension 5 5  Ankle dorsiflexion    Ankle plantarflexion    Ankle inversion    Ankle eversion     (Blank rows = not tested)  LUMBAR SPECIAL TESTS:  Straight leg raise test: Negative and Slump test: Negative  FUNCTIONAL TESTS:  30 Second Sit to Stand: 15 reps  GAIT: Distance walked: 35ft Assistive device utilized: None Level of assistance: Complete Independence Comments: no deviations noted  TREATMENT: [facility] Adult PT Treatment:                                                DATE: 06/29/2024 Therapeutic Exercise: Supine fig 4 piriformis stretch x 30 L Supine sciatic nerve glide x 15 L S/L hip abd x 10 L Bridge with GTB x 10  S/L clamshell x 10 GTB L Seated fig 4 stretch x 30 L  PATIENT EDUCATION:  Education details: eval findings, ODI, HEP, POC Person educated: Patient Education method: Explanation, Demonstration, and Handouts Education comprehension: verbalized understanding and returned demonstration  HOME EXERCISE PROGRAM: Access Code: [email or web address] URL: [email or web address] Date:  06/29/2024 Prepared by: Alm Kingdom  Exercises - Supine Figure 4 Piriformis Stretch  - 1 x daily - 7 x weekly - 2-3 reps - 30 sec hold - Supine Sciatic Nerve Glide  - 1 x daily - 7 x weekly - 2-3 sets - 1 reps - Sidelying Hip Abduction  - 1 x daily - 7 x weekly - 2-3 sets - 10 reps - Supine Bridge with Resistance Band  - 1 x daily - 7 x weekly - 3 sets - 10 reps - green band hold - Clamshell with Resistance  - 1 x daily - 7 x weekly - 3 sets - 10 reps - green band hold - Seated Figure 4 Piriformis Stretch  - 1 x daily - 7 x weekly - 2-3 sets - 30 sec hold  ASSESSMENT:  CLINICAL IMPRESSION: Patient is a 63 y.o. F who was seen today for physical therapy evaluation and treatment for LBP that radiates into L LE. Physical findings are consistent with MD impression as pt demonstrates decrease in L hip ROM and strength as well as positive neural tension findings. ODI score shows slight disability in performance of home ADLs and community activities. Pt would benefit from skilled PT services working on improving L hip flexibility and decreasing neural tension down L LE.   OBJECTIVE IMPAIRMENTS: decreased activity tolerance, decreased mobility, decreased strength, and pain  ACTIVITY LIMITATIONS: sitting, squatting, and locomotion level  PARTICIPATION LIMITATIONS: driving, shopping, community activity, occupation, and yard work  PERSONAL FACTORS: Time since onset of injury/illness/exacerbation are also affecting patient's functional outcome.   REHAB POTENTIAL: Excellent  CLINICAL DECISION MAKING: Stable/uncomplicated  EVALUATION COMPLEXITY: Low   GOALS: Goals reviewed with patient? No  SHORT TERM GOALS: Target  date: 07/20/2024   Pt will be compliant and knowledgeable with initial HEP for improved comfort and carryover Baseline: initial HEP given  Goal status: INITIAL  2.  Pt will self report back and L hip pain no greater than 2/10 for improved comfort and functional ability Baseline:  4/10 at worst Goal status: INITIAL   LONG TERM GOALS: Target date: 08/24/2024   Pt will be decrease ODI disability score to no greater than 4% as proxy for functional improvement Baseline: 12% disability  Goal status: INITIAL  2.  Pt will self report low back L hip pain no greater than 1/10 for improved comfort and functional ability Baseline: 4/10 at worst Goal status: INITIAL   3.  Pt will improve L hip abd MMT to at least 5/5 for improved comfort and functional mobility Baseline: see chart Goal status: INITIAL   4.  Pt will be compliant and knowledgeable with final HEP for improved comfort and carryover post discharge Baseline: N/A Goal status: INITIAL   PLAN:  PT FREQUENCY: every other week  PT DURATION: 8 weeks  PLANNED INTERVENTIONS: 97110-Therapeutic exercises, 97530- Therapeutic activity, 97112- Neuromuscular re-education, 97535- Self Care, [id number]- Manual therapy, and Patient/Family education  PLAN FOR NEXT SESSION: assess HEP response, core/hip, L hip stretching, neural tensioners    Alm JAYSON Kingdom, PT 06/30/2024, 11:03 AM

## 2024-07-12 ENCOUNTER — Ambulatory Visit: Admitting: Internal Medicine

## 2024-07-12 ENCOUNTER — Encounter: Payer: Self-pay | Admitting: Internal Medicine

## 2024-07-12 VITALS — BP 120/72 | HR 68 | Ht 65.0 in | Wt 154.6 lb

## 2024-07-12 DIAGNOSIS — D126 Benign neoplasm of colon, unspecified: Secondary | ICD-10-CM | POA: Diagnosis not present

## 2024-07-12 DIAGNOSIS — R5383 Other fatigue: Secondary | ICD-10-CM

## 2024-07-12 DIAGNOSIS — E785 Hyperlipidemia, unspecified: Secondary | ICD-10-CM | POA: Diagnosis not present

## 2024-07-12 DIAGNOSIS — K760 Fatty (change of) liver, not elsewhere classified: Secondary | ICD-10-CM

## 2024-07-12 DIAGNOSIS — R7303 Prediabetes: Secondary | ICD-10-CM

## 2024-07-12 DIAGNOSIS — R748 Abnormal levels of other serum enzymes: Secondary | ICD-10-CM

## 2024-07-12 DIAGNOSIS — F411 Generalized anxiety disorder: Secondary | ICD-10-CM

## 2024-07-12 DIAGNOSIS — G43109 Migraine with aura, not intractable, without status migrainosus: Secondary | ICD-10-CM

## 2024-07-12 MED ORDER — ATORVASTATIN CALCIUM 20 MG PO TABS: 20.0000 mg | ORAL_TABLET | Freq: Every day | ORAL | 3 refills | Status: AC

## 2024-07-12 MED ORDER — TRIAMCINOLONE ACETONIDE 0.1 % EX CREA: 1.0000 | TOPICAL_CREAM | Freq: Two times a day (BID) | CUTANEOUS | 0 refills | Status: AC

## 2024-07-12 NOTE — Assessment & Plan Note (Signed)
 With  normal iron levels in June.  Repeating today Not phenotypic   Lab Results  Component Value Date   IRON 128 01/16/2024   TIBC 343 01/16/2024   FERRITIN 51 01/16/2024   Lab Results  Component Value Date   WBC 5.7 01/16/2024   HGB 13.7 01/16/2024   HCT 40.9 01/16/2024   MCV 90.3 01/16/2024   PLT 295 01/16/2024

## 2024-07-12 NOTE — Assessment & Plan Note (Signed)
 Managed with reduction in  celexa  to every other day

## 2024-07-12 NOTE — Patient Instructions (Signed)
 For a viral URI:  Don't forget to add Afrin for a few days if needed

## 2024-07-12 NOTE — Assessment & Plan Note (Signed)
 Did not tolerate metformin  due to persistent diarrhea.  Tolerating statin. SABRA Ultrasound reviewed. Repeat iron studies needed

## 2024-07-12 NOTE — Assessment & Plan Note (Signed)
Referral to [facility] in process

## 2024-07-12 NOTE — Assessment & Plan Note (Signed)
 SHE HAS ocular migraines and uses citalopram  to prevent recurrence  , taken every  other day

## 2024-07-12 NOTE — Progress Notes (Unsigned)
 Subjective:  Patient ID: Kristine Mcguire, female    DOB: 1960/08/21  Age: 63 y.o. MRN: [id number]  CC: The primary encounter diagnosis was Hyperlipidemia with target LDL less than 160. 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   HPI Kristine Mcguire presents for  Chief Complaint  Patient presents with   Medical Management of Chronic Issues    6 month follow up    1) GAD:  she has been taking citalopram  every other day for migaine prevention  2) neurotic itching :   initially affected the  bottom of feet ,  now resolved.  Current sites are chest wall and left shoulder   2) HLD: tolerating atorvastatin    3) URI mild symptoms present since Thursday .  No sinus congestion or pain   4) Hereditary hemochromatosis:  reviewed last CBC ,  hematology note.  Her 59 yr old son was recently diagnosed with  hemochromatosis found during annual CPE.  He has been advised to managed with periodic blood donations.  She has not required phlebotomy    Outpatient Medications Prior to Visit  Medication Sig Dispense Refill   Cholecalciferol (VITAMIN D3) 1000 UNITS CHEW Chew 2 tablets by mouth daily.      citalopram  (CELEXA ) 10 MG tablet TAKE 1 TABLET BY MOUTH EVERY DAY 90 tablet 3   estradiol  (ESTRACE  VAGINAL) 0.1 MG/GM vaginal cream 1 gram intravagainally at night for 2 weeks,  then twice weekly thereafter 42.5 g 0   atorvastatin  (LIPITOR) 20 MG tablet Take 1 tablet (20 mg total) by mouth daily. 90 tablet 3   No facility-administered medications prior to visit.    Review of Systems;  Patient denies headache, fevers, malaise, unintentional weight loss, skin rash, eye pain, sinus congestion and sinus pain, sore throat, dysphagia,  hemoptysis ,  cough, dyspnea, wheezing, chest pain, palpitations, orthopnea, edema, abdominal pain, nausea, melena, diarrhea, constipation, flank pain, dysuria, hematuria, urinary  Frequency, nocturia, numbness, tingling, seizures,  Focal weakness, Loss of consciousness,  Tremor, insomnia, depression, anxiety, and suicidal ideation.      Objective:  BP 120/72   Pulse 68   Ht 5' 5 (1.651 m)   Wt 154 lb 9.6 oz (70.1 kg)   SpO2 97%   BMI 25.73 kg/m   BP Readings from Last 3 Encounters:  07/12/24 120/72  01/16/24 105/64  01/07/24 112/64    Wt Readings from Last 3 Encounters:  07/12/24 154 lb 9.6 oz (70.1 kg)  01/16/24 159 lb 6.4 oz (72.3 kg)  01/07/24 159 lb 12.8 oz (72.5 kg)    Physical Exam Vitals reviewed.  Constitutional:      General: She is not in acute distress.    Appearance: Normal appearance. She is normal weight. She is not ill-appearing, toxic-appearing or diaphoretic.  HENT:     Head: Normocephalic.  Eyes:     General: No scleral icterus.       Right eye: No discharge.        Left eye: No discharge.     Conjunctiva/sclera: Conjunctivae normal.  Cardiovascular:     Rate and Rhythm: Normal rate and regular rhythm.     Heart sounds: Normal heart sounds.  Pulmonary:     Effort: Pulmonary effort is normal. No respiratory distress.     Breath sounds: Normal  breath sounds.  Musculoskeletal:        General: Normal range of motion.  Skin:    General: Skin is warm and dry.  Neurological:     General: No focal deficit present.     Mental Status: She is alert and oriented to person, place, and time. Mental status is at baseline.  Psychiatric:        Mood and Affect: Mood normal.        Behavior: Behavior normal.        Thought Content: Thought content normal.        Judgment: Judgment normal.     Lab Results  Component Value Date   HGBA1C 5.7 06/05/2023   HGBA1C 5.8 11/25/2022   HGBA1C 5.7 11/28/2021    Lab Results  Component Value Date   CREATININE 0.86 01/16/2024    CREATININE 0.93 01/07/2024   CREATININE 0.84 06/05/2023    Lab Results  Component Value Date   WBC 5.7 01/16/2024   HGB 13.7 01/16/2024   HCT 40.9 01/16/2024   PLT 295 01/16/2024   GLUCOSE 100 (H) 01/16/2024   CHOL 177 01/07/2024   TRIG 200.0 (H) 01/07/2024   HDL 50.00 01/07/2024   LDLDIRECT 101.0 01/07/2024   LDLCALC 87 01/07/2024   ALT 23 01/16/2024   AST 25 01/16/2024   NA 138 01/16/2024   K 4.1 01/16/2024   CL 108 01/16/2024   CREATININE 0.86 01/16/2024   BUN 16 01/16/2024   CO2 24 01/16/2024   TSH 2.05 06/05/2023   HGBA1C 5.7 06/05/2023    MM 3D SCREENING MAMMOGRAM BILATERAL BREAST Result Date: 02/12/2024 CLINICAL DATA:  Screening. EXAM: DIGITAL SCREENING BILATERAL MAMMOGRAM WITH TOMOSYNTHESIS AND CAD TECHNIQUE: Bilateral screening digital craniocaudal and mediolateral oblique mammograms were obtained. Bilateral screening digital breast tomosynthesis was performed. The images were evaluated with computer-aided detection. COMPARISON:  Previous exam(s). ACR Breast Density Category c: The breasts are heterogeneously dense, which may obscure small masses. FINDINGS: There are no findings suspicious for malignancy. IMPRESSION: No mammographic evidence of malignancy. A result letter of this screening mammogram will be mailed directly to the patient. RECOMMENDATION: Screening mammogram in one year. (Code:SM-B-01Y) BI-RADS CATEGORY  1: Negative. Electronically Signed   By: Inocente Ast M.D.   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  Repeating today Not phenotypic   Lab Results  Component Value Date   IRON 128 01/16/2024   TIBC 343 01/16/2024   FERRITIN 51 01/16/2024   Lab Results  Component Value Date   WBC 5.7 01/16/2024   HGB 13.7 01/16/2024   HCT 40.9 01/16/2024   MCV 90.3 01/16/2024   PLT 295 01/16/2024     Orders: -     CBC with Differential/Platelet -     IBC + Ferritin  Steatosis of liver Assessment & Plan: Did not tolerate metformin  due to persistent diarrhea.  Tolerating statin. SABRA Ultrasound reviewed. 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  Dispense: 90 tablet; Refill: 3 -     Triamcinolone Acetonide; Apply 1 Application topically 2 (two) times daily. To rash  Dispense: 30 g; Refill: 0     I spent 34 minutes on the day of this face to face encounter reviewing patient's  most recent visit with cardiology,  nephrology,  and neurology,  prior relevant surgical and non surgical procedures, recent  labs and imaging studies, counseling on weight management,  reviewing the assessment and plan with patient, and post visit ordering and reviewing of  diagnostics and therapeutics with patient  .   Follow-up: Return in about 6 months (around 01/10/2025) for physical.   Kristine LITTIE Kettering, MD

## 2024-07-13 LAB — CBC WITH DIFFERENTIAL/PLATELET
Basophils Absolute: 0 K/uL (ref 0.0–0.1)
Basophils Relative: 0.3 % (ref 0.0–3.0)
Eosinophils Absolute: 0.1 K/uL (ref 0.0–0.7)
Eosinophils Relative: 0.8 % (ref 0.0–5.0)
HCT: 39.6 % (ref 36.0–46.0)
Hemoglobin: 13.5 g/dL (ref 12.0–15.0)
Lymphocytes Relative: 22.7 % (ref 12.0–46.0)
Lymphs Abs: 1.9 K/uL (ref 0.7–4.0)
MCHC: 34.1 g/dL (ref 30.0–36.0)
MCV: 90.9 fl (ref 78.0–100.0)
Monocytes Absolute: 0.6 K/uL (ref 0.1–1.0)
Monocytes Relative: 7.1 % (ref 3.0–12.0)
Neutro Abs: 5.7 K/uL (ref 1.4–7.7)
Neutrophils Relative %: 69.1 % (ref 43.0–77.0)
Platelets: 327 K/uL (ref 150.0–400.0)
RBC: 4.36 Mil/uL (ref 3.87–5.11)
RDW: 12.7 % (ref 11.5–15.5)
WBC: 8.2 K/uL (ref 4.0–10.5)

## 2024-07-13 LAB — LIPID PANEL
Cholesterol: 168 mg/dL (ref 0–200)
HDL: 46.9 mg/dL (ref >39.00)
LDL Cholesterol: 74 mg/dL (ref 0–99)
NonHDL: 121.01
Total CHOL/HDL Ratio: 4
Triglycerides: 236 mg/dL — ABNORMAL HIGH (ref 0.0–149.0)
VLDL: 47.2 mg/dL — ABNORMAL HIGH (ref 0.0–40.0)

## 2024-07-13 LAB — COMPREHENSIVE METABOLIC PANEL WITH GFR
ALT: 27 U/L (ref 0–35)
AST: 27 U/L (ref 0–37)
Albumin: 4.6 g/dL (ref 3.5–5.2)
Alkaline Phosphatase: 121 U/L — ABNORMAL HIGH (ref 39–117)
BUN: 15 mg/dL (ref 6–23)
CO2: 31 meq/L (ref 19–32)
Calcium: 9.4 mg/dL (ref 8.4–10.5)
Chloride: 103 meq/L (ref 96–112)
Creatinine, Ser: 0.74 mg/dL (ref 0.40–1.20)
GFR: 85.43 mL/min (ref >60.00)
Glucose, Bld: 90 mg/dL (ref 70–99)
Potassium: 3.7 meq/L (ref 3.5–5.1)
Sodium: 141 meq/L (ref 135–145)
Total Bilirubin: 0.4 mg/dL (ref 0.2–1.2)
Total Protein: 6.6 g/dL (ref 6.0–8.3)

## 2024-07-13 LAB — IBC + FERRITIN
Ferritin: 65.3 ng/mL (ref 10.0–291.0)
Iron: 77 ug/dL (ref 42–145)
Saturation Ratios: 24.2 % (ref 20.0–50.0)
TIBC: 317.8 ug/dL (ref 250.0–450.0)
Transferrin: 227 mg/dL (ref 212.0–360.0)

## 2024-07-13 LAB — TSH: TSH: 1.03 u[IU]/mL (ref 0.35–5.50)

## 2024-07-13 LAB — LDL CHOLESTEROL, DIRECT: Direct LDL: 91 mg/dL

## 2024-07-13 LAB — HEMOGLOBIN A1C: Hgb A1c MFr Bld: 5.7 % (ref 4.6–6.5)

## 2024-07-15 ENCOUNTER — Ambulatory Visit: Payer: Self-pay | Admitting: Internal Medicine

## 2024-07-15 NOTE — Addendum Note (Signed)
 Addended by: MARYLYNN VERNEITA CROME on: 07/15/2024 06:46 AM   Modules accepted: Orders

## 2024-07-27 ENCOUNTER — Ambulatory Visit

## 2024-08-10 MED ORDER — ESTRADIOL 0.01 % VA CREA: 1.0000 g | TOPICAL_CREAM | VAGINAL | 3 refills | Status: AC

## 2024-08-10 NOTE — Telephone Encounter (Signed)
 Tried to refill the Estradiol  but it is giving an error stating that medication is no longer available for ordering.

## 2024-08-18 ENCOUNTER — Other Ambulatory Visit (INDEPENDENT_AMBULATORY_CARE_PROVIDER_SITE_OTHER)

## 2024-08-18 DIAGNOSIS — R748 Abnormal levels of other serum enzymes: Secondary | ICD-10-CM | POA: Diagnosis not present

## 2024-08-19 LAB — HEPATIC FUNCTION PANEL
ALT: 32 U/L (ref 3–35)
AST: 30 U/L (ref 5–37)
Albumin: 4.4 g/dL (ref 3.5–5.2)
Alkaline Phosphatase: 109 U/L (ref 39–117)
Bilirubin, Direct: 0.1 mg/dL (ref 0.1–0.3)
Total Bilirubin: 0.5 mg/dL (ref 0.2–1.2)
Total Protein: 6.5 g/dL (ref 6.0–8.3)

## 2024-08-20 ENCOUNTER — Other Ambulatory Visit: Payer: Self-pay

## 2025-01-10 ENCOUNTER — Encounter: Admitting: Internal Medicine

## 2025-01-20 ENCOUNTER — Ambulatory Visit: Admitting: Dermatology

## 2025-01-21 ENCOUNTER — Other Ambulatory Visit

## 2025-01-21 ENCOUNTER — Ambulatory Visit: Admitting: Oncology
